# Patient Record
Sex: Female | Born: 1985 | Race: White | Hispanic: No | Marital: Married | Smoking: Never smoker
Health system: Southern US, Community
[De-identification: ages and names within clinical notes are randomized; demographics above are authoritative.]

## PROBLEM LIST (undated history)

## (undated) ENCOUNTER — Inpatient Hospital Stay (HOSPITAL_COMMUNITY)

## (undated) ENCOUNTER — Inpatient Hospital Stay (HOSPITAL_COMMUNITY): Payer: Self-pay

## (undated) ENCOUNTER — Inpatient Hospital Stay: Payer: Self-pay

## (undated) DIAGNOSIS — O368130 Decreased fetal movements, third trimester, not applicable or unspecified: Secondary | ICD-10-CM

## (undated) DIAGNOSIS — F329 Major depressive disorder, single episode, unspecified: Secondary | ICD-10-CM

## (undated) DIAGNOSIS — J302 Other seasonal allergic rhinitis: Secondary | ICD-10-CM

## (undated) DIAGNOSIS — T7840XA Allergy, unspecified, initial encounter: Secondary | ICD-10-CM

## (undated) DIAGNOSIS — K8020 Calculus of gallbladder without cholecystitis without obstruction: Secondary | ICD-10-CM

## (undated) DIAGNOSIS — O99210 Obesity complicating pregnancy, unspecified trimester: Secondary | ICD-10-CM

## (undated) DIAGNOSIS — G4700 Insomnia, unspecified: Secondary | ICD-10-CM

## (undated) DIAGNOSIS — F410 Panic disorder [episodic paroxysmal anxiety] without agoraphobia: Secondary | ICD-10-CM

## (undated) DIAGNOSIS — D649 Anemia, unspecified: Secondary | ICD-10-CM

## (undated) DIAGNOSIS — O0990 Supervision of high risk pregnancy, unspecified, unspecified trimester: Secondary | ICD-10-CM

## (undated) DIAGNOSIS — K219 Gastro-esophageal reflux disease without esophagitis: Secondary | ICD-10-CM

## (undated) DIAGNOSIS — E785 Hyperlipidemia, unspecified: Secondary | ICD-10-CM

## (undated) DIAGNOSIS — L03213 Periorbital cellulitis: Secondary | ICD-10-CM

## (undated) DIAGNOSIS — F32A Depression, unspecified: Secondary | ICD-10-CM

## (undated) DIAGNOSIS — E538 Deficiency of other specified B group vitamins: Secondary | ICD-10-CM

## (undated) DIAGNOSIS — C539 Malignant neoplasm of cervix uteri, unspecified: Secondary | ICD-10-CM

## (undated) HISTORY — DX: Calculus of gallbladder without cholecystitis without obstruction: K80.20

## (undated) HISTORY — DX: Major depressive disorder, single episode, unspecified: F32.9

## (undated) HISTORY — DX: Panic disorder (episodic paroxysmal anxiety): F41.0

## (undated) HISTORY — PX: WISDOM TOOTH EXTRACTION: SHX21

## (undated) HISTORY — DX: Depression, unspecified: F32.A

## (undated) HISTORY — DX: Gastro-esophageal reflux disease without esophagitis: K21.9

## (undated) HISTORY — DX: Allergy, unspecified, initial encounter: T78.40XA

---
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
## 2007-12-07 ENCOUNTER — Encounter: Payer: Self-pay | Admitting: Obstetrics and Gynecology

## 2007-12-07 ENCOUNTER — Ambulatory Visit: Payer: Self-pay | Admitting: Obstetrics and Gynecology

## 2008-12-13 ENCOUNTER — Ambulatory Visit: Payer: Self-pay | Admitting: Obstetrics and Gynecology

## 2009-07-20 ENCOUNTER — Emergency Department: Payer: Self-pay | Admitting: Emergency Medicine

## 2010-01-09 ENCOUNTER — Ambulatory Visit: Payer: Self-pay | Admitting: Obstetrics & Gynecology

## 2010-09-02 NOTE — Assessment & Plan Note (Signed)
NAME:  Loretta Burton, KIL NO.:  [id number]   MEDICAL RECORD NO.:  [id number]          PATIENT TYPE:  POB   LOCATION:  [facility] at [facility]         FACILITY:  [facility]   PHYSICIAN:  Argentina Donovan, MD        DATE OF BIRTH:  1986/01/19   DATE OF SERVICE:  12/07/2007                                  CLINIC NOTE   The patient is a 25 year old Caucasian female, nulligravida who has had  a recent onset of severe cramping the day before and during the first  day of her period.  Up until a year ago, she had been on oral  contraceptives.  She stopped them because of the weight gain, but would  like to start back on some relatively low dose.   PHYSICAL EXAMINATION:  VITAL SIGNS:  The patient is 5 feet 4 inches and  weighs 159 pounds.  Her blood pressure is 124/63, pulse 57 per minute.  GENERAL:  A well-developed and well-nourished white female, in no acute  distress.  HEENT:  Within normal limits.  LUNGS:  Clear to auscultation and percussion.  HEART:  No murmur.  Normal sinus rhythm.  ABDOMEN:  Soft, flat, and nontender.  No masses or organomegaly.  BACK:  Erect.  No CVA tenderness.  EXTREMITIES:  No edema.  No varices.  GENITALIA:  External genitalia normal.  BUS within normal limits.  Vagina is clean and well rugated.  Introitus is marital.  Cervix was  nulliparous and the uterus is anterior, normal size, shape, and  consistency with normal adnexa and free cul-de-sac.   ALLERGIES:  The patient has no allergies.   FAMILY HISTORY:  No significant family history.   PAST SURGICAL HISTORY:  She has had no surgery.   MEDICATIONS:  She is on no medications.  Pap smear was taken.  We are  starting the patient on Loestrin 24 Fe.  I gave her 56-month samples and  prescription for a year.   IMPRESSION:  Normal gynecological examination.   PLAN:  Instructions given in detail how to take the pill.           ______________________________  Argentina Donovan, MD     PR/MEDQ  D:   12/07/2007  T:  12/07/2007  Job:  [id number]

## 2010-09-02 NOTE — Assessment & Plan Note (Signed)
NAMEKHANH, CORDNER            ACCOUNT NO.:  [id number]   MEDICAL RECORD NO.:  [id number]          PATIENT TYPE:  POB   LOCATION:  [facility] at [facility]         FACILITY:  [facility]   PHYSICIAN:  Elsie Lincoln, MD      DATE OF BIRTH:  Oct 02, 1985   DATE OF SERVICE:  01/09/2010                                  CLINIC NOTE   The patient is a 25 year old well-woman female who presents for her  yearly exam.  The patient is on Loestrin FE 24 and very happy.  She has  relatively light periods.  No dysmenorrhea.  She is happily married with  no plans for pregnancy currently.   PAST MEDICAL HISTORY:  Denies all medical problems.   PAST SURGICAL HISTORY:  Denies all surgeries.   GYNECOLOGIC HISTORY:  She is nulliparous.  No problem with periods.  Never had abnormal Pap smear.  No STDs, ovarian cyst, or fibroid tumors.   FAMILY HISTORY:  Positive for diabetes and heart disease in grandparents  and there is a skin cancer like, most of them with basal and squamous.   SOCIAL HISTORY:  She lives with her husband.  She does not smoke.  She  drinks rare alcohol and does drink caffeinated beverages.  She has never  been a victim of sexual or physical abuse.   REVIEW OF SYSTEMS:  Systems review was negative.   PHYSICAL EXAMINATION:  VITAL SIGNS:  Pulse 74, blood pressure 111/71,  weight 193, height 5 feet 4 inches.  GENERAL:  Well-nourished, well-developed, in no apparent distress.  HEENT:  Normocephalic, atraumatic.  Good dentition.  Thyroid no masses.  LUNGS:  Clear to auscultation bilaterally.  HEART:  Regular rate and rhythm.  BREASTS:  No masses, no nipple discharge, no skin changes, no  lymphadenopathy.  ABDOMEN:  Soft, nontender.  No organomegaly, no hernia.  GENITALIA:  Tanner V.  Vagina pink.  Normal rugae.  Cervix closed,  nontender.  Uterus anteverted, nontender.  Adnexa; no masses, nontender.  EXTREMITIES:  No edema.   ASSESSMENT AND PLAN:  A 25 year old female with well-woman  exam.  1. Pap smear.  2. Gonorrhea and Chlamydia.  3. Loestrin FE 24 prescription given for 32 months.  4. Return to clinic in a year.           ______________________________  Elsie Lincoln, MD     KL/MEDQ  D:  01/09/2010  T:  01/10/2010  Job:  [id number]

## 2010-09-02 NOTE — Assessment & Plan Note (Signed)
NAME:  Loretta Burton, ROSENBURG NO.:  [id number]   MEDICAL RECORD NO.:  [id number]          PATIENT TYPE:  POB   LOCATION:  [facility]         FACILITY:  [facility]   PHYSICIAN:  Catalina Antigua, MD     DATE OF BIRTH:  [birth year]   DATE OF SERVICE:  12/13/2008                                  CLINIC NOTE   LOCATION:  [place].   CLINIC NOTE:  This is a 25 year old Caucasian female para 0 who  presented for annual exam.  The patient is currently without complaint.  Her birth control method is Loestrin 62.  She is very content with this  birth control method and she reports that her episodes of cramping and  dysmenorrhea have resolved since her initiation of OCP.  The patient  denies any abnormal discharge or abnormal bleeding.   PHYSICAL EXAMINATION:  VITAL SIGNS:  Blood pressure of 127/71, pulse of  83, her weight is 169 pounds, and her height is 5 feet 4 inches.  GENERAL APPEARANCE:  She is well developed and well nourished, in no  acute distress.  HEART:  Normal sinus rhythm.  BREASTS:  Normal.  No palpable masses, equal in size.  No dimpling.  ABDOMEN:  Soft, nontender, nondistended.  PELVIC:  Her genitalia appeared normal.  Normal vaginal mucosa.  Normal-  appearing cervix.  Uterus is anteverted with no palpable adnexal mass.   PAST MEDICAL HISTORY:  Noncontributory.   OB/GYN HISTORY:  Her LMP was November 25, 2008.   PAST SURGICAL HISTORY:  No past surgical history.   MEDICATION:  The only medication she is taking right now the Loestrin.   IMPRESSION:  Normal gynecologic examination.   PLAN:  The patient had a pap smear and cultures performed and a  prescription for Loestrin 24 Fe as well as a sample was given to the  patient.  The patient has 41-month supply.  The patient is instructed to  return in 1 year or as needed.           ______________________________  Catalina Antigua, MD     PC/MEDQ  D:  12/13/2008  T:  12/13/2008  Job:  [id number]

## 2010-10-04 ENCOUNTER — Emergency Department: Payer: Self-pay | Admitting: Emergency Medicine

## 2010-11-28 ENCOUNTER — Other Ambulatory Visit: Payer: Self-pay | Admitting: Obstetrics & Gynecology

## 2011-07-19 ENCOUNTER — Other Ambulatory Visit: Payer: Self-pay | Admitting: Obstetrics & Gynecology

## 2011-07-29 NOTE — Telephone Encounter (Signed)
Pt needs to come in for annual exam.  Refills given.

## 2011-10-05 ENCOUNTER — Ambulatory Visit (INDEPENDENT_AMBULATORY_CARE_PROVIDER_SITE_OTHER): Payer: 59 | Admitting: Obstetrics & Gynecology

## 2011-10-05 ENCOUNTER — Encounter: Payer: Self-pay | Admitting: Obstetrics & Gynecology

## 2011-10-05 VITALS — BP 111/75 | HR 81 | Ht 64.0 in | Wt 195.0 lb

## 2011-10-05 DIAGNOSIS — Z124 Encounter for screening for malignant neoplasm of cervix: Secondary | ICD-10-CM

## 2011-10-05 DIAGNOSIS — Z3041 Encounter for surveillance of contraceptive pills: Secondary | ICD-10-CM

## 2011-10-05 DIAGNOSIS — Z113 Encounter for screening for infections with a predominantly sexual mode of transmission: Secondary | ICD-10-CM

## 2011-10-05 DIAGNOSIS — Z01419 Encounter for gynecological examination (general) (routine) without abnormal findings: Secondary | ICD-10-CM

## 2011-10-05 MED ORDER — NORETHIN ACE-ETH ESTRAD-FE 1-20 MG-MCG(24) PO TABS: 1.0000 | ORAL_TABLET | Freq: Every day | ORAL | Status: DC

## 2011-10-05 NOTE — Progress Notes (Signed)
  Subjective:     Loretta Burton is a 26 y.o. G0 female and is here for a comprehensive physical GYN exam. The patient reports no problems. Denies any irregular bleeding, abnormal vaginal discharge, problems with intercourse or other concerns.  PAST MEDICAL HISTORY: Panic attacks   PAST SURGICAL HISTORY: Denies all surgeries.   GYNECOLOGIC HISTORY: She is nulliparous. No problem with periods. Never had abnormal Pap smear. No STDs, ovarian cyst, or fibroid tumors.   MEDICATIONS: Xanax prn, Loestrin 24 Fe  ALLERGIES: Ciprofloxacin - Hives, GI intolerance  FAMILY HISTORY: Positive for diabetes and heart disease in grandparents and there is a skin cancer like, most of them with basal and squamous.   SOCIAL HISTORY: She lives with her husband. She does not smoke. She drinks rare alcohol and does drink caffeinated beverages. She has never been a victim of sexual or physical abuse.   The following portions of the patient's history were reviewed and updated as appropriate: allergies, current medications, past family history, past medical history, past social history, past surgical history and problem list.  Review of Systems A comprehensive review of systems was negative.   Objective:  Blood pressure 111/75, pulse 81, height 5\' 4"  (1.626 m), weight 195 lb (88.451 kg), last menstrual period 10/01/2011.  GENERAL: Well-nourished, well-developed, in no apparent distress.  HEENT: Normocephalic, atraumatic. Good dentition. Thyroid no masses.  LUNGS: Clear to auscultation bilaterally.  HEART: Regular rate and rhythm.  BREASTS: No masses, no nipple discharge, no skin changes, no lymphadenopathy.  ABDOMEN: Soft, nontender. No organomegaly, no hernia.  GENITALIA: Tanner V. Vagina pink. Normal rugae. Cervix closed, nontender, prominent ectropion noted. Uterus anteverted, nontender. Adnexa; no masses, nontender.  EXTREMITIES: No edema.   Assessment:    Healthy female exam.    Plan:    1. Pap  smear done.  2. Gonorrhea and Chlamydia checked.  3. Loestrin FE 24 prescription refilled 4. Return to clinic in a year or earlier if needed

## 2011-10-05 NOTE — Patient Instructions (Signed)
Preventive Care for Adults, Female A healthy lifestyle and preventive care can promote health and wellness. Preventive health guidelines for women include the following key practices.  A routine yearly physical is a good way to check with your caregiver about your health and preventive screening. It is a chance to share any concerns and updates on your health, and to receive a thorough exam.   Visit your dentist for a routine exam and preventive care every 6 months. Brush your teeth twice a day and floss once a day. Good oral hygiene prevents tooth decay and gum disease.   The frequency of eye exams is based on your age, health, family medical history, use of contact lenses, and other factors. Follow your caregiver's recommendations for frequency of eye exams.   Eat a healthy diet. Foods like vegetables, fruits, whole grains, low-fat dairy products, and lean protein foods contain the nutrients you need without too many calories. Decrease your intake of foods high in solid fats, added sugars, and salt. Eat the right amount of calories for you.Get information about a proper diet from your caregiver, if necessary.   Regular physical exercise is one of the most important things you can do for your health. Most adults should get at least 150 minutes of moderate-intensity exercise (any activity that increases your heart rate and causes you to sweat) each week. In addition, most adults need muscle-strengthening exercises on 2 or more days a week.   Maintain a healthy weight. The body mass index (BMI) is a screening tool to identify possible weight problems. It provides an estimate of body fat based on height and weight. Your caregiver can help determine your BMI, and can help you achieve or maintain a healthy weight.For adults 20 years and older:   A BMI below 18.5 is considered underweight.   A BMI of 18.5 to 24.9 is normal.   A BMI of 25 to 29.9 is considered overweight.   A BMI of 30 and above is  considered obese.   Maintain normal blood lipids and cholesterol levels by exercising and minimizing your intake of saturated fat. Eat a balanced diet with plenty of fruit and vegetables. Blood tests for lipids and cholesterol should begin at age 20 and be repeated every 5 years. If your lipid or cholesterol levels are high, you are over 50, or you are at high risk for heart disease, you may need your cholesterol levels checked more frequently.Ongoing high lipid and cholesterol levels should be treated with medicines if diet and exercise are not effective.   If you smoke, find out from your caregiver how to quit. If you do not use tobacco, do not start.   If you are pregnant, do not drink alcohol. If you are breastfeeding, be very cautious about drinking alcohol. If you are not pregnant and choose to drink alcohol, do not exceed 1 drink per day. One drink is considered to be 12 ounces (355 mL) of beer, 5 ounces (148 mL) of wine, or 1.5 ounces (44 mL) of liquor.   Avoid use of street drugs. Do not share needles with anyone. Ask for help if you need support or instructions about stopping the use of drugs.   High blood pressure causes heart disease and increases the risk of stroke. Your blood pressure should be checked at least every 1 to 2 years. Ongoing high blood pressure should be treated with medicines if weight loss and exercise are not effective.   If you are 55 to 24   years old, ask your caregiver if you should take aspirin to prevent strokes.   Diabetes screening involves taking a blood sample to check your fasting blood sugar level. This should be done once every 3 years, after age 45, if you are within normal weight and without risk factors for diabetes. Testing should be considered at a younger age or be carried out more frequently if you are overweight and have at least 1 risk factor for diabetes.   Breast cancer screening is essential preventive care for women. You should practice "breast  self-awareness." This means understanding the normal appearance and feel of your breasts and may include breast self-examination. Any changes detected, no matter how small, should be reported to a caregiver. Women in their 20s and 30s should have a clinical breast exam (CBE) by a caregiver as part of a regular health exam every 1 to 3 years. After age 40, women should have a CBE every year. Starting at age 40, women should consider having a mammography (breast X-ray test) every year. Women who have a family history of breast cancer should talk to their caregiver about genetic screening. Women at a high risk of breast cancer should talk to their caregivers about having magnetic resonance imaging (MRI) and a mammography every year.   The Pap test is a screening test for cervical cancer. A Pap test can show cell changes on the cervix that might become cervical cancer if left untreated. A Pap test is a procedure in which cells are obtained and examined from the lower end of the uterus (cervix).   Women should have a Pap test starting at age 21.   Between ages 21 and 29, Pap tests should be repeated every 2 years.   Beginning at age 30, you should have a Pap test every 3 years as long as the past 3 Pap tests have been normal.   Some women have medical problems that increase the chance of getting cervical cancer. Talk to your caregiver about these problems. It is especially important to talk to your caregiver if a new problem develops soon after your last Pap test. In these cases, your caregiver may recommend more frequent screening and Pap tests.   The above recommendations are the same for women who have or have not gotten the vaccine for human papillomavirus (HPV).   If you had a hysterectomy for a problem that was not cancer or a condition that could lead to cancer, then you no longer need Pap tests. Even if you no longer need a Pap test, a regular exam is a good idea to make sure no other problems are  starting.   If you are between ages 65 and 70, and you have had normal Pap tests going back 10 years, you no longer need Pap tests. Even if you no longer need a Pap test, a regular exam is a good idea to make sure no other problems are starting.   If you have had past treatment for cervical cancer or a condition that could lead to cancer, you need Pap tests and screening for cancer for at least 20 years after your treatment.   If Pap tests have been discontinued, risk factors (such as a new sexual partner) need to be reassessed to determine if screening should be resumed.   The HPV test is an additional test that may be used for cervical cancer screening. The HPV test looks for the virus that can cause the cell changes on the cervix.   The cells collected during the Pap test can be tested for HPV. The HPV test could be used to screen women aged 30 years and older, and should be used in women of any age who have unclear Pap test results. After the age of 30, women should have HPV testing at the same frequency as a Pap test.   Colorectal cancer can be detected and often prevented. Most routine colorectal cancer screening begins at the age of 50 and continues through age 75. However, your caregiver may recommend screening at an earlier age if you have risk factors for colon cancer. On a yearly basis, your caregiver may provide home test kits to check for hidden blood in the stool. Use of a small camera at the end of a tube, to directly examine the colon (sigmoidoscopy or colonoscopy), can detect the earliest forms of colorectal cancer. Talk to your caregiver about this at age 50, when routine screening begins. Direct examination of the colon should be repeated every 5 to 10 years through age 75, unless early forms of pre-cancerous polyps or small growths are found.   Hepatitis C blood testing is recommended for all people born from 1945 through 1965 and any individual with known risks for hepatitis C.    Practice safe sex. Use condoms and avoid high-risk sexual practices to reduce the spread of sexually transmitted infections (STIs). STIs include gonorrhea, chlamydia, syphilis, trichomonas, herpes, HPV, and human immunodeficiency virus (HIV). Herpes, HIV, and HPV are viral illnesses that have no cure. They can result in disability, cancer, and death. Sexually active women aged 25 and younger should be checked for chlamydia. Older women with new or multiple partners should also be tested for chlamydia. Testing for other STIs is recommended if you are sexually active and at increased risk.   Osteoporosis is a disease in which the bones lose minerals and strength with aging. This can result in serious bone fractures. The risk of osteoporosis can be identified using a bone density scan. Women ages 65 and over and women at risk for fractures or osteoporosis should discuss screening with their caregivers. Ask your caregiver whether you should take a calcium supplement or vitamin D to reduce the rate of osteoporosis.   Menopause can be associated with physical symptoms and risks. Hormone replacement therapy is available to decrease symptoms and risks. You should talk to your caregiver about whether hormone replacement therapy is right for you.   Use sunscreen with sun protection factor (SPF) of 30 or more. Apply sunscreen liberally and repeatedly throughout the day. You should seek shade when your shadow is shorter than you. Protect yourself by wearing long sleeves, pants, a wide-brimmed hat, and sunglasses year round, whenever you are outdoors.   Once a month, do a whole body skin exam, using a mirror to look at the skin on your back. Notify your caregiver of new moles, moles that have irregular borders, moles that are larger than a pencil eraser, or moles that have changed in shape or color.   Stay current with required immunizations.   Influenza. You need a dose every fall (or winter). The composition of  the flu vaccine changes each year, so being vaccinated once is not enough.   Pneumococcal polysaccharide. You need 1 to 2 doses if you smoke cigarettes or if you have certain chronic medical conditions. You need 1 dose at age 65 (or older) if you have never been vaccinated.   Tetanus, diphtheria, pertussis (Tdap, Td). Get 1 dose of   Tdap vaccine if you are younger than age 65, are over 65 and have contact with an infant, are a healthcare worker, are pregnant, or simply want to be protected from whooping cough. After that, you need a Td booster dose every 10 years. Consult your caregiver if you have not had at least 3 tetanus and diphtheria-containing shots sometime in your life or have a deep or dirty wound.   HPV. You need this vaccine if you are a woman age 26 or younger. The vaccine is given in 3 doses over 6 months.   Measles, mumps, rubella (MMR). You need at least 1 dose of MMR if you were born in 1957 or later. You may also need a second dose.   Meningococcal. If you are age 19 to 21 and a first-year college student living in a residence hall, or have one of several medical conditions, you need to get vaccinated against meningococcal disease. You may also need additional booster doses.   Zoster (shingles). If you are age 60 or older, you should get this vaccine.   Varicella (chickenpox). If you have never had chickenpox or you were vaccinated but received only 1 dose, talk to your caregiver to find out if you need this vaccine.   Hepatitis A. You need this vaccine if you have a specific risk factor for hepatitis A virus infection or you simply wish to be protected from this disease. The vaccine is usually given as 2 doses, 6 to 18 months apart.   Hepatitis B. You need this vaccine if you have a specific risk factor for hepatitis B virus infection or you simply wish to be protected from this disease. The vaccine is given in 3 doses, usually over 6 months.  Preventive Services /  Frequency Ages 19 to 39  Blood pressure check.** / Every 1 to 2 years.   Lipid and cholesterol check.** / Every 5 years beginning at age 20.   Clinical breast exam.** / Every 3 years for women in their 20s and 30s.   Pap test.** / Every 2 years from ages 21 through 29. Every 3 years starting at age 30 through age 65 or 70 with a history of 3 consecutive normal Pap tests.   HPV screening.** / Every 3 years from ages 30 through ages 65 to 70 with a history of 3 consecutive normal Pap tests.   Hepatitis C blood test.** / For any individual with known risks for hepatitis C.   Skin self-exam. / Monthly.   Influenza immunization.** / Every year.   Pneumococcal polysaccharide immunization.** / 1 to 2 doses if you smoke cigarettes or if you have certain chronic medical conditions.   Tetanus, diphtheria, pertussis (Tdap, Td) immunization. / A one-time dose of Tdap vaccine. After that, you need a Td booster dose every 10 years.   HPV immunization. / 3 doses over 6 months, if you are 26 and younger.   Measles, mumps, rubella (MMR) immunization. / You need at least 1 dose of MMR if you were born in 1957 or later. You may also need a second dose.   Meningococcal immunization. / 1 dose if you are age 19 to 21 and a first-year college student living in a residence hall, or have one of several medical conditions, you need to get vaccinated against meningococcal disease. You may also need additional booster doses.   Varicella immunization.** / Consult your caregiver.   Hepatitis A immunization.** / Consult your caregiver. 2 doses, 6 to 18 months   apart.   Hepatitis B immunization.** / Consult your caregiver. 3 doses usually over 6 months.    ** Family history and personal history of risk and conditions may change your caregiver's recommendations. Document Released: 06/02/2001 Document Revised: 03/26/2011 Document Reviewed: 09/01/2010 [employer] Patient Information 2012 [employer]. 

## 2012-01-04 ENCOUNTER — Telehealth: Payer: Self-pay | Admitting: Gynecology

## 2012-01-04 DIAGNOSIS — Z309 Encounter for contraceptive management, unspecified: Secondary | ICD-10-CM

## 2012-01-04 MED ORDER — ORTHO TRI-CYCLEN LO 0.18/0.215/0.25 MG-25 MCG PO TABS: 1.0000 | ORAL_TABLET | Freq: Every day | ORAL | Status: DC

## 2012-01-04 NOTE — Telephone Encounter (Signed)
Pat.ient call [facility] discontinue her loestrin and would like a another script send to [facility]. Will send ortho lo to [facility].

## 2013-01-14 ENCOUNTER — Other Ambulatory Visit: Payer: Self-pay | Admitting: Obstetrics & Gynecology

## 2013-10-13 ENCOUNTER — Ambulatory Visit: Payer: 59 | Admitting: Obstetrics & Gynecology

## 2014-01-17 DIAGNOSIS — F419 Anxiety disorder, unspecified: Secondary | ICD-10-CM | POA: Insufficient documentation

## 2014-01-17 DIAGNOSIS — F329 Major depressive disorder, single episode, unspecified: Secondary | ICD-10-CM | POA: Insufficient documentation

## 2014-02-28 ENCOUNTER — Ambulatory Visit (INDEPENDENT_AMBULATORY_CARE_PROVIDER_SITE_OTHER): Payer: No Typology Code available for payment source | Admitting: Obstetrics & Gynecology

## 2014-02-28 ENCOUNTER — Encounter: Payer: Self-pay | Admitting: Obstetrics & Gynecology

## 2014-02-28 VITALS — BP 126/77 | HR 94 | Wt 221.0 lb

## 2014-02-28 DIAGNOSIS — Z113 Encounter for screening for infections with a predominantly sexual mode of transmission: Secondary | ICD-10-CM

## 2014-02-28 DIAGNOSIS — Z3490 Encounter for supervision of normal pregnancy, unspecified, unspecified trimester: Secondary | ICD-10-CM | POA: Insufficient documentation

## 2014-02-28 DIAGNOSIS — Z3491 Encounter for supervision of normal pregnancy, unspecified, first trimester: Secondary | ICD-10-CM

## 2014-02-28 DIAGNOSIS — Z124 Encounter for screening for malignant neoplasm of cervix: Secondary | ICD-10-CM

## 2014-02-28 NOTE — Patient Instructions (Signed)
First Trimester of Pregnancy The first trimester of pregnancy is from week 1 until the end of week 12 (months 1 through 3). A week after a sperm fertilizes an egg, the egg will implant on the wall of the uterus. This embryo will begin to develop into a baby. Genes from you and your partner are forming the baby. The female genes determine whether the baby is a boy or a girl. At 6-8 weeks, the eyes and face are formed, and the heartbeat can be seen on ultrasound. At the end of 12 weeks, all the baby's organs are formed.  Now that you are pregnant, you will want to do everything you can to have a healthy baby. Two of the most important things are to get good prenatal care and to follow your health care provider's instructions. Prenatal care is all the medical care you receive before the baby's birth. This care will help prevent, find, and treat any problems during the pregnancy and childbirth. BODY CHANGES Your body goes through many changes during pregnancy. The changes vary from woman to woman.   You may gain or lose a couple of pounds at first.  You may feel sick to your stomach (nauseous) and throw up (vomit). If the vomiting is uncontrollable, call your health care provider.  You may tire easily.  You may develop headaches that can be relieved by medicines approved by your health care provider.  You may urinate more often. Painful urination may mean you have a bladder infection.  You may develop heartburn as a result of your pregnancy.  You may develop constipation because certain hormones are causing the muscles that push waste through your intestines to slow down.  You may develop hemorrhoids or swollen, bulging veins (varicose veins).  Your breasts may begin to grow larger and become tender. Your nipples may stick out more, and the tissue that surrounds them (areola) may become darker.  Your gums may bleed and may be sensitive to brushing and flossing.  Dark spots or blotches (chloasma,  mask of pregnancy) may develop on your face. This will likely fade after the baby is born.  Your menstrual periods will stop.  You may have a loss of appetite.  You may develop cravings for certain kinds of food.  You may have changes in your emotions from day to day, such as being excited to be pregnant or being concerned that something may go wrong with the pregnancy and baby.  You may have more vivid and strange dreams.  You may have changes in your hair. These can include thickening of your hair, rapid growth, and changes in texture. Some women also have hair loss during or after pregnancy, or hair that feels dry or thin. Your hair will most likely return to normal after your baby is born. WHAT TO EXPECT AT YOUR PRENATAL VISITS During a routine prenatal visit:  You will be weighed to make sure you and the baby are growing normally.  Your blood pressure will be taken.  Your abdomen will be measured to track your baby's growth.  The fetal heartbeat will be listened to starting around week 10 or 12 of your pregnancy.  Test results from any previous visits will be discussed. Your health care provider may ask you:  How you are feeling.  If you are feeling the baby move.  If you have had any abnormal symptoms, such as leaking fluid, bleeding, severe headaches, or abdominal cramping.  If you have any questions. Other tests   that may be performed during your first trimester include:  Blood tests to find your blood type and to check for the presence of any previous infections. They will also be used to check for low iron levels (anemia) and Rh antibodies. Later in the pregnancy, blood tests for diabetes will be done along with other tests if problems develop.  Urine tests to check for infections, diabetes, or protein in the urine.  An ultrasound to confirm the proper growth and development of the baby.  An amniocentesis to check for possible genetic problems.  Fetal screens for  spina bifida and Down syndrome.  You may need other tests to make sure you and the baby are doing well. HOME CARE INSTRUCTIONS  Medicines  Follow your health care provider's instructions regarding medicine use. Specific medicines may be either safe or unsafe to take during pregnancy.  Take your prenatal vitamins as directed.  If you develop constipation, try taking a stool softener if your health care provider approves. Diet  Eat regular, well-balanced meals. Choose a variety of foods, such as meat or vegetable-based protein, fish, milk and low-fat dairy products, vegetables, fruits, and whole grain breads and cereals. Your health care provider will help you determine the amount of weight gain that is right for you.  Avoid raw meat and uncooked cheese. These carry germs that can cause birth defects in the baby.  Eating four or five small meals rather than three large meals a day may help relieve nausea and vomiting. If you start to feel nauseous, eating a few soda crackers can be helpful. Drinking liquids between meals instead of during meals also seems to help nausea and vomiting.  If you develop constipation, eat more high-fiber foods, such as fresh vegetables or fruit and whole grains. Drink enough fluids to keep your urine clear or pale yellow. Activity and Exercise  Exercise only as directed by your health care provider. Exercising will help you:  Control your weight.  Stay in shape.  Be prepared for labor and delivery.  Experiencing pain or cramping in the lower abdomen or low back is a good sign that you should stop exercising. Check with your health care provider before continuing normal exercises.  Try to avoid standing for long periods of time. Move your legs often if you must stand in one place for a long time.  Avoid heavy lifting.  Wear low-heeled shoes, and practice good posture.  You may continue to have sex unless your health care provider directs you  otherwise. Relief of Pain or Discomfort  Wear a good support bra for breast tenderness.   Take warm sitz baths to soothe any pain or discomfort caused by hemorrhoids. Use hemorrhoid cream if your health care provider approves.   Rest with your legs elevated if you have leg cramps or low back pain.  If you develop varicose veins in your legs, wear support hose. Elevate your feet for 15 minutes, 3-4 times a day. Limit salt in your diet. Prenatal Care  Schedule your prenatal visits by the twelfth week of pregnancy. They are usually scheduled monthly at first, then more often in the last 2 months before delivery.  Write down your questions. Take them to your prenatal visits.  Keep all your prenatal visits as directed by your health care provider. Safety  Wear your seat belt at all times when driving.  Make a list of emergency phone numbers, including numbers for family, friends, the hospital, and police and fire departments. General Tips    Ask your health care provider for a referral to a local prenatal education class. Begin classes no later than at the beginning of month 6 of your pregnancy.  Ask for help if you have counseling or nutritional needs during pregnancy. Your health care provider can offer advice or refer you to specialists for help with various needs.  Do not use hot tubs, steam rooms, or saunas.  Do not douche or use tampons or scented sanitary pads.  Do not cross your legs for long periods of time.  Avoid cat litter boxes and soil used by cats. These carry germs that can cause birth defects in the baby and possibly loss of the fetus by miscarriage or stillbirth.  Avoid all smoking, herbs, alcohol, and medicines not prescribed by your health care provider. Chemicals in these affect the formation and growth of the baby.  Schedule a dentist appointment. At home, brush your teeth with a soft toothbrush and be gentle when you floss. SEEK MEDICAL CARE IF:   You have  dizziness.  You have mild pelvic cramps, pelvic pressure, or nagging pain in the abdominal area.  You have persistent nausea, vomiting, or diarrhea.  You have a bad smelling vaginal discharge.  You have pain with urination.  You notice increased swelling in your face, hands, legs, or ankles. SEEK IMMEDIATE MEDICAL CARE IF:   You have a fever.  You are leaking fluid from your vagina.  You have spotting or bleeding from your vagina.  You have severe abdominal cramping or pain.  You have rapid weight gain or loss.  You vomit blood or material that looks like coffee grounds.  You are exposed to German measles and have never had them.  You are exposed to fifth disease or chickenpox.  You develop a severe headache.  You have shortness of breath.  You have any kind of trauma, such as from a fall or a car accident. Document Released: 03/31/2001 Document Revised: 08/21/2013 Document Reviewed: 02/14/2013 Exit[employer] Patient Information 2015 [employer]. This information is not intended to replace advice given to you by your health care provider. Make sure you discuss any questions you have with your health care provider.  

## 2014-02-28 NOTE — Progress Notes (Signed)
   Subjective:   Loretta Burton is a 28 y.o.G1P0 at 2747w4d by LMP being seen today for her first obstetrical visit.  Patient reports no significant complaints.  Filed Vitals:   02/28/14 0948  BP: 126/77  Pulse: 94  Weight: 221 lb (100.245 kg)    HISTORY: OB History  Gravida Para Term Preterm AB SAB TAB Ectopic Multiple Living  1 0 0 0 0 0 0 0 0 0     # Outcome Date GA Lbr Len/2nd Weight Sex Delivery Anes PTL Lv  1 Current              Past Medical History  Diagnosis Date  . Depression   . Panic attack   . Allergy     seasonal   Past Surgical History  Procedure Laterality Date  . Wisdom tooth extraction      x4   Family History  Problem Relation Age of Onset  . Diabetes Mother   . Hypertension Mother   . Depression Mother   . Diabetes Maternal Grandmother   . Hypertension Maternal Grandmother   . Heart disease Maternal Grandmother   . Cancer Maternal Grandmother     skin  . Diabetes Paternal Grandmother   . Hypertension Paternal Grandmother   . Heart disease Paternal Grandmother   . Diabetes Paternal Grandfather   . Hypertension Paternal Grandfather   . Heart disease Paternal Grandfather      Exam    Uterus:     Pelvic Exam:    Perineum: No Hemorrhoids, Normal Perineum   Vulva: normal   Vagina:  normal mucosa, normal discharge   Cervix: no bleeding following Pap, no cervical motion tenderness, no lesions and nulliparous appearance   Adnexa: normal adnexa and no mass, fullness, tenderness   Bony Pelvis: average  System: Breast:  normal appearance, no masses or tenderness   Skin: normal coloration and turgor, no rashes   Neurologic: oriented, normal   Extremities: normal strength, tone, and muscle mass   HEENT PERRLA and extra ocular movement intact   Mouth/Teeth mucous membranes moist, pharynx normal without lesions and dental hygiene good   Neck supple and no masses   Cardiovascular: regular rate and rhythm   Respiratory:  appears well, vitals  normal, no respiratory distress, acyanotic, normal RR, chest clear, no wheezing, crepitations, rhonchi, normal symmetric air entry   Abdomen: soft, non-tender; bowel sounds normal; no masses,  no organomegaly   Urinary: urethral meatus normal   Clinic u/s : Viable 13101w1d  [facility]   Assessment:    Pregnancy: G1P0000 Patient Active Problem List   Diagnosis Date Noted  . Supervision of normal pregnancy 02/28/2014     Plan:   Initial labs drawn. Continue prenatal vitamins. Problem list reviewed and updated. Genetic Screening discussed First Screen, NIPS and Quad Screen: all declined. Ultrasound discussed; fetal survey: to be ordered later. [facility] with multiple MDs and other Advanced Practitioners was explained to patient; also emphasized that residents, students are part of our team. Follow up in 4 weeks.  Routine obstetric precautions reviewed.   Tereso NewcomerANYANWU,UGONNA A, MD 02/28/2014

## 2014-03-01 LAB — PRENATAL PROFILE (SOLSTAS)
Antibody Screen: NEGATIVE
BASOS ABS: 0 10*3/uL (ref 0.0–0.1)
BASOS PCT: 0 % (ref 0–1)
EOS ABS: 0.1 10*3/uL (ref 0.0–0.7)
Eosinophils Relative: 1 % (ref 0–5)
HCT: 36.8 % (ref 36.0–46.0)
HEMOGLOBIN: 12.3 g/dL (ref 12.0–15.0)
HEP B S AG: NEGATIVE
HIV: NONREACTIVE
Lymphocytes Relative: 22 % (ref 12–46)
Lymphs Abs: 2.4 10*3/uL (ref 0.7–4.0)
MCH: 30.1 pg (ref 26.0–34.0)
MCHC: 33.4 g/dL (ref 30.0–36.0)
MCV: 90.2 fL (ref 78.0–100.0)
MONOS PCT: 7 % (ref 3–12)
Monocytes Absolute: 0.8 10*3/uL (ref 0.1–1.0)
NEUTROS ABS: 7.6 10*3/uL (ref 1.7–7.7)
NEUTROS PCT: 70 % (ref 43–77)
PLATELETS: 268 10*3/uL (ref 150–400)
RBC: 4.08 MIL/uL (ref 3.87–5.11)
RDW: 13.5 % (ref 11.5–15.5)
Rh Type: POSITIVE
Rubella: 2.07 Index — ABNORMAL HIGH (ref <0.90)
WBC: 10.8 10*3/uL — ABNORMAL HIGH (ref 4.0–10.5)

## 2014-03-03 LAB — CULTURE, OB URINE

## 2014-03-05 ENCOUNTER — Telehealth: Payer: Self-pay | Admitting: Obstetrics & Gynecology

## 2014-03-05 DIAGNOSIS — O219 Vomiting of pregnancy, unspecified: Secondary | ICD-10-CM

## 2014-03-05 LAB — CYTOLOGY - PAP

## 2014-03-05 MED ORDER — PROMETHAZINE HCL 25 MG PO TABS: 25.0000 mg | ORAL_TABLET | Freq: Four times a day (QID) | ORAL | Status: DC | PRN

## 2014-03-05 NOTE — Telephone Encounter (Signed)
Patient was offered an Rx for nausea and vomiting as last prenatal visit, but declined.  Has decided now she does want Rx since symptoms are persisting.

## 2014-03-28 ENCOUNTER — Ambulatory Visit (INDEPENDENT_AMBULATORY_CARE_PROVIDER_SITE_OTHER): Payer: No Typology Code available for payment source | Admitting: Obstetrics & Gynecology

## 2014-03-28 ENCOUNTER — Encounter: Payer: Self-pay | Admitting: Obstetrics & Gynecology

## 2014-03-28 VITALS — BP 107/63 | HR 90 | Wt 212.0 lb

## 2014-03-28 DIAGNOSIS — O219 Vomiting of pregnancy, unspecified: Secondary | ICD-10-CM

## 2014-03-28 DIAGNOSIS — Z3491 Encounter for supervision of normal pregnancy, unspecified, first trimester: Secondary | ICD-10-CM

## 2014-03-28 MED ORDER — ONDANSETRON 4 MG PO TBDP: 4.0000 mg | ORAL_TABLET | Freq: Four times a day (QID) | ORAL | Status: DC | PRN

## 2014-03-28 NOTE — Progress Notes (Signed)
Pt c/o nausea not controlled with Phenergan and insomnia. Zofran prn and melatonin F/u in 4 weeks or sooner prn

## 2014-03-28 NOTE — Patient Instructions (Signed)
Morning Sickness °Morning sickness is when you feel sick to your stomach (nauseous) during pregnancy. You may feel sick to your stomach and throw up (vomit). You may feel sick in the morning, but you can feel this way any time of day. Some women feel very sick to their stomach and cannot stop throwing up (hyperemesis gravidarum). °HOME CARE °· Only take medicines as told by your doctor. °· Take multivitamins as told by your doctor. Taking multivitamins before getting pregnant can stop or lessen the harshness of morning sickness. °· Eat dry toast or unsalted crackers before getting out of bed. °· Eat 5 to 6 small meals a day. °· Eat dry and bland foods like rice and baked potatoes. °· Do not drink liquids with meals. Drink between meals. °· Do not eat greasy, fatty, or spicy foods. °· Have someone cook for you if the smell of food causes you to feel sick or throw up. °· If you feel sick to your stomach after taking prenatal vitamins, take them at night or with a snack. °· Eat protein when you need a snack (nuts, yogurt, cheese). °· Eat unsweetened gelatins for dessert. °· Wear a bracelet used for sea sickness (acupressure wristband). °· Go to a doctor that puts thin needles into certain body points (acupuncture) to improve how you feel. °· Do not smoke. °· Use a humidifier to keep the air in your house free of odors. °· Get lots of fresh air. °GET HELP IF: °· You need medicine to feel better. °· You feel dizzy or lightheaded. °· You are losing weight. °GET HELP RIGHT AWAY IF:  °· You feel very sick to your stomach and cannot stop throwing up. °· You pass out (faint). °MAKE SURE YOU: °· Understand these instructions. °· Will watch your condition. °· Will get help right away if you are not doing well or get worse. °Document Released: 05/14/2004 Document Revised: 04/11/2013 Document Reviewed: 09/21/2012 °ExitCare® Patient Information ©2015 [employer]. This information is not intended to replace advice given to you by  your health care provider. Make sure you discuss any questions you have with your health care provider. ° °

## 2014-04-20 NOTE — L&D Delivery Note (Cosign Needed)
Delivery Note At 4:13 PM a viable female was delivered via Vaginal, Spontaneous Delivery (Presentation: ;left Occiput Anterior).  APGAR: 6, 8; weight  .   Placenta status: Intact, Spontaneous.  Cord: 3 vessels with the following complications: None.  Cord pH: n/a  Anesthesia: Epidural  Episiotomy: None Lacerations: 1st degree Suture Repair: 2.0 vicryl Est. Blood Loss (mL): 50  Mom to postpartum.  Baby to Couplet care / Skin to Skin.  Wyvonnia Dusky DARLENE 10/14/2014, 5:19 PM

## 2014-04-26 ENCOUNTER — Encounter: Payer: Self-pay | Admitting: Obstetrics & Gynecology

## 2014-04-26 ENCOUNTER — Ambulatory Visit (INDEPENDENT_AMBULATORY_CARE_PROVIDER_SITE_OTHER): Payer: No Typology Code available for payment source | Admitting: Obstetrics & Gynecology

## 2014-04-26 VITALS — BP 111/71 | HR 100 | Wt 208.0 lb

## 2014-04-26 DIAGNOSIS — Z36 Encounter for antenatal screening of mother: Secondary | ICD-10-CM

## 2014-04-26 DIAGNOSIS — Z3492 Encounter for supervision of normal pregnancy, unspecified, second trimester: Secondary | ICD-10-CM

## 2014-04-26 NOTE — Progress Notes (Signed)
Routine visit. Denies any problems. Early glucola today. Declines genetic screening. MSAFP at next visit. Schedule anatomy scan.

## 2014-04-27 LAB — GLUCOSE TOLERANCE, 1 HOUR (50G) W/O FASTING: Glucose, 1 Hour GTT: 130 mg/dL (ref 70–140)

## 2014-05-07 ENCOUNTER — Telehealth: Payer: Self-pay | Admitting: Obstetrics & Gynecology

## 2014-05-07 DIAGNOSIS — Z3491 Encounter for supervision of normal pregnancy, unspecified, first trimester: Secondary | ICD-10-CM

## 2014-05-07 DIAGNOSIS — O219 Vomiting of pregnancy, unspecified: Secondary | ICD-10-CM

## 2014-05-07 MED ORDER — ONDANSETRON 4 MG PO TBDP: 4.0000 mg | ORAL_TABLET | Freq: Four times a day (QID) | ORAL | Status: DC | PRN

## 2014-05-07 NOTE — Telephone Encounter (Signed)
Patient requested refills of zofran.

## 2014-05-18 ENCOUNTER — Other Ambulatory Visit: Payer: Self-pay | Admitting: Obstetrics & Gynecology

## 2014-05-18 ENCOUNTER — Ambulatory Visit (HOSPITAL_COMMUNITY)
Admission: RE | Admit: 2014-05-18 | Discharge: 2014-05-18 | Disposition: A | Discharge status: Home / Self Care | Payer: No Typology Code available for payment source | Source: Ambulatory Visit | Attending: Obstetrics & Gynecology | Admitting: Obstetrics & Gynecology

## 2014-05-18 DIAGNOSIS — Z3492 Encounter for supervision of normal pregnancy, unspecified, second trimester: Secondary | ICD-10-CM | POA: Insufficient documentation

## 2014-05-18 DIAGNOSIS — Z3689 Encounter for other specified antenatal screening: Secondary | ICD-10-CM | POA: Insufficient documentation

## 2014-05-18 DIAGNOSIS — Z3A18 18 weeks gestation of pregnancy: Secondary | ICD-10-CM | POA: Insufficient documentation

## 2014-05-25 ENCOUNTER — Ambulatory Visit (INDEPENDENT_AMBULATORY_CARE_PROVIDER_SITE_OTHER): Payer: No Typology Code available for payment source | Admitting: Family Medicine

## 2014-05-25 VITALS — BP 116/64 | HR 112 | Wt 209.0 lb

## 2014-05-25 DIAGNOSIS — Z3491 Encounter for supervision of normal pregnancy, unspecified, first trimester: Secondary | ICD-10-CM

## 2014-05-25 DIAGNOSIS — Z3492 Encounter for supervision of normal pregnancy, unspecified, second trimester: Secondary | ICD-10-CM

## 2014-05-25 DIAGNOSIS — O26892 Other specified pregnancy related conditions, second trimester: Secondary | ICD-10-CM

## 2014-05-25 DIAGNOSIS — O219 Vomiting of pregnancy, unspecified: Secondary | ICD-10-CM

## 2014-05-25 DIAGNOSIS — R300 Dysuria: Secondary | ICD-10-CM

## 2014-05-25 LAB — POCT URINALYSIS DIPSTICK
Bilirubin, UA: NEGATIVE
Glucose, UA: NEGATIVE
Nitrite, UA: NEGATIVE
Protein, UA: NEGATIVE
RBC UA: NEGATIVE
Spec Grav, UA: 1.01
Urobilinogen, UA: NEGATIVE
pH, UA: 5

## 2014-05-25 MED ORDER — ONDANSETRON 4 MG PO TBDP: 4.0000 mg | ORAL_TABLET | Freq: Four times a day (QID) | ORAL | Status: DC | PRN

## 2014-05-25 NOTE — Progress Notes (Signed)
Needs refill on her Zofran.  Still having nausea, wanting to make sure this is normal versus gall bladder issue. Large ketones, moderate leuks on urine dip.

## 2014-05-25 NOTE — Progress Notes (Signed)
C/o nausea Needs refill on Zofran Finds that some foods are making her feel queasy.

## 2014-05-25 NOTE — Patient Instructions (Signed)
Second Trimester of Pregnancy The second trimester is from week 13 through week 28, months 4 through 6. The second trimester is often a time when you feel your best. Your body has also adjusted to being pregnant, and you begin to feel better physically. Usually, morning sickness has lessened or quit completely, you may have more energy, and you may have an increase in appetite. The second trimester is also a time when the fetus is growing rapidly. At the end of the sixth month, the fetus is about 9 inches long and weighs about 1 pounds. You will likely begin to feel the baby move (quickening) between 18 and 20 weeks of the pregnancy. BODY CHANGES Your body goes through many changes during pregnancy. The changes vary from woman to woman.   Your weight will continue to increase. You will notice your lower abdomen bulging out.  You may begin to get stretch marks on your hips, abdomen, and breasts.  You may develop headaches that can be relieved by medicines approved by your health care provider.  You may urinate more often because the fetus is pressing on your bladder.  You may develop or continue to have heartburn as a result of your pregnancy.  You may develop constipation because certain hormones are causing the muscles that push waste through your intestines to slow down.  You may develop hemorrhoids or swollen, bulging veins (varicose veins).  You may have back pain because of the weight gain and pregnancy hormones relaxing your joints between the bones in your pelvis and as a result of a shift in weight and the muscles that support your balance.  Your breasts will continue to grow and be tender.  Your gums may bleed and may be sensitive to brushing and flossing.  Dark spots or blotches (chloasma, mask of pregnancy) may develop on your face. This will likely fade after the baby is born.  A dark line from your belly button to the pubic area (linea nigra) may appear. This will likely  fade after the baby is born.  You may have changes in your hair. These can include thickening of your hair, rapid growth, and changes in texture. Some women also have hair loss during or after pregnancy, or hair that feels dry or thin. Your hair will most likely return to normal after your baby is born. WHAT TO EXPECT AT YOUR PRENATAL VISITS During a routine prenatal visit:  You will be weighed to make sure you and the fetus are growing normally.  Your blood pressure will be taken.  Your abdomen will be measured to track your baby's growth.  The fetal heartbeat will be listened to.  Any test results from the previous visit will be discussed. Your health care provider may ask you:  How you are feeling.  If you are feeling the baby move.  If you have had any abnormal symptoms, such as leaking fluid, bleeding, severe headaches, or abdominal cramping.  If you have any questions. Other tests that may be performed during your second trimester include:  Blood tests that check for:  Low iron levels (anemia).  Gestational diabetes (between 24 and 28 weeks).  Rh antibodies.  Urine tests to check for infections, diabetes, or protein in the urine.  An ultrasound to confirm the proper growth and development of the baby.  An amniocentesis to check for possible genetic problems.  Fetal screens for spina bifida and Down syndrome. HOME CARE INSTRUCTIONS   Avoid all smoking, herbs, alcohol, and unprescribed   drugs. These chemicals affect the formation and growth of the baby.  Follow your health care provider's instructions regarding medicine use. There are medicines that are either safe or unsafe to take during pregnancy.  Exercise only as directed by your health care provider. Experiencing uterine cramps is a good sign to stop exercising.  Continue to eat regular, healthy meals.  Wear a good support bra for breast tenderness.  Do not use hot tubs, steam rooms, or saunas.  Wear  your seat belt at all times when driving.  Avoid raw meat, uncooked cheese, cat litter boxes, and soil used by cats. These carry germs that can cause birth defects in the baby.  Take your prenatal vitamins.  Try taking a stool softener (if your health care provider approves) if you develop constipation. Eat more high-fiber foods, such as fresh vegetables or fruit and whole grains. Drink plenty of fluids to keep your urine clear or pale yellow.  Take warm sitz baths to soothe any pain or discomfort caused by hemorrhoids. Use hemorrhoid cream if your health care provider approves.  If you develop varicose veins, wear support hose. Elevate your feet for 15 minutes, 3-4 times a day. Limit salt in your diet.  Avoid heavy lifting, wear low heel shoes, and practice good posture.  Rest with your legs elevated if you have leg cramps or low back pain.  Visit your dentist if you have not gone yet during your pregnancy. Use a soft toothbrush to brush your teeth and be gentle when you floss.  A sexual relationship may be continued unless your health care provider directs you otherwise.  Continue to go to all your prenatal visits as directed by your health care provider. SEEK MEDICAL CARE IF:   You have dizziness.  You have mild pelvic cramps, pelvic pressure, or nagging pain in the abdominal area.  You have persistent nausea, vomiting, or diarrhea.  You have a bad smelling vaginal discharge.  You have pain with urination. SEEK IMMEDIATE MEDICAL CARE IF:   You have a fever.  You are leaking fluid from your vagina.  You have spotting or bleeding from your vagina.  You have severe abdominal cramping or pain.  You have rapid weight gain or loss.  You have shortness of breath with chest pain.  You notice sudden or extreme swelling of your face, hands, ankles, feet, or legs.  You have not felt your baby move in over an hour.  You have severe headaches that do not go away with  medicine.  You have vision changes. Document Released: 03/31/2001 Document Revised: 04/11/2013 Document Reviewed: 06/07/2012 [employer] Patient Information 2015 [employer]. This information is not intended to replace advice given to you by your health care provider. Make sure you discuss any questions you have with your health care provider.  Breastfeeding Deciding to breastfeed is one of the best choices you can make for you and your baby. A change in hormones during pregnancy causes your breast tissue to grow and increases the number and size of your milk ducts. These hormones also allow proteins, sugars, and fats from your blood supply to make breast milk in your milk-producing glands. Hormones prevent breast milk from being released before your baby is born as well as prompt milk flow after birth. Once breastfeeding has begun, thoughts of your baby, as well as his or her sucking or crying, can stimulate the release of milk from your milk-producing glands.  BENEFITS OF BREASTFEEDING For Your Baby  Your first   milk (colostrum) helps your baby's digestive system function better.   There are antibodies in your milk that help your baby fight off infections.   Your baby has a lower incidence of asthma, allergies, and sudden infant death syndrome.   The nutrients in breast milk are better for your baby than infant formulas and are designed uniquely for your baby's needs.   Breast milk improves your baby's brain development.   Your baby is less likely to develop other conditions, such as childhood obesity, asthma, or type 2 diabetes mellitus.  For You   Breastfeeding helps to create a very special bond between you and your baby.   Breastfeeding is convenient. Breast milk is always available at the correct temperature and costs nothing.   Breastfeeding helps to burn calories and helps you lose the weight gained during pregnancy.   Breastfeeding makes your uterus contract to its  prepregnancy size faster and slows bleeding (lochia) after you give birth.   Breastfeeding helps to lower your risk of developing type 2 diabetes mellitus, osteoporosis, and breast or ovarian cancer later in life. SIGNS THAT YOUR BABY IS HUNGRY Early Signs of Hunger  Increased alertness or activity.  Stretching.  Movement of the head from side to side.  Movement of the head and opening of the mouth when the corner of the mouth or cheek is stroked (rooting).  Increased sucking sounds, smacking lips, cooing, sighing, or squeaking.  Hand-to-mouth movements.  Increased sucking of fingers or hands. Late Signs of Hunger  Fussing.  Intermittent crying. Extreme Signs of Hunger Signs of extreme hunger will require calming and consoling before your baby will be able to breastfeed successfully. Do not wait for the following signs of extreme hunger to occur before you initiate breastfeeding:   Restlessness.  A loud, strong cry.   Screaming. BREASTFEEDING BASICS Breastfeeding Initiation  Find a comfortable place to sit or lie down, with your neck and back well supported.  Place a pillow or rolled up blanket under your baby to bring him or her to the level of your breast (if you are seated). Nursing pillows are specially designed to help support your arms and your baby while you breastfeed.  Make sure that your baby's abdomen is facing your abdomen.   Gently massage your breast. With your fingertips, massage from your chest wall toward your nipple in a circular motion. This encourages milk flow. You may need to continue this action during the feeding if your milk flows slowly.  Support your breast with 4 fingers underneath and your thumb above your nipple. Make sure your fingers are well away from your nipple and your baby's mouth.   Stroke your baby's lips gently with your finger or nipple.   When your baby's mouth is open wide enough, quickly bring your baby to your breast,  placing your entire nipple and as much of the colored area around your nipple (areola) as possible into your baby's mouth.   More areola should be visible above your baby's upper lip than below the lower lip.   Your baby's tongue should be between his or her lower gum and your breast.   Ensure that your baby's mouth is correctly positioned around your nipple (latched). Your baby's lips should create a seal on your breast and be turned out (everted).  It is common for your baby to suck about 2-3 minutes in order to start the flow of breast milk. Latching Teaching your baby how to latch on to your breast   properly is very important. An improper latch can cause nipple pain and decreased milk supply for you and poor weight gain in your baby. Also, if your baby is not latched onto your nipple properly, he or she may swallow some air during feeding. This can make your baby fussy. Burping your baby when you switch breasts during the feeding can help to get rid of the air. However, teaching your baby to latch on properly is still the best way to prevent fussiness from swallowing air while breastfeeding. Signs that your baby has successfully latched on to your nipple:    Silent tugging or silent sucking, without causing you pain.   Swallowing heard between every 3-4 sucks.    Muscle movement above and in front of his or her ears while sucking.  Signs that your baby has not successfully latched on to nipple:   Sucking sounds or smacking sounds from your baby while breastfeeding.  Nipple pain. If you think your baby has not latched on correctly, slip your finger into the corner of your baby's mouth to break the suction and place it between your baby's gums. Attempt breastfeeding initiation again. Signs of Successful Breastfeeding Signs from your baby:   A gradual decrease in the number of sucks or complete cessation of sucking.   Falling asleep.   Relaxation of his or her body.    Retention of a small amount of milk in his or her mouth.   Letting go of your breast by himself or herself. Signs from you:  Breasts that have increased in firmness, weight, and size 1-3 hours after feeding.   Breasts that are softer immediately after breastfeeding.  Increased milk volume, as well as a change in milk consistency and color by the fifth day of breastfeeding.   Nipples that are not sore, cracked, or bleeding. Signs That Your Baby is Getting Enough Milk  Wetting at least 3 diapers in a 24-hour period. The urine should be clear and pale yellow by age 5 days.  At least 3 stools in a 24-hour period by age 5 days. The stool should be soft and yellow.  At least 3 stools in a 24-hour period by age 7 days. The stool should be seedy and yellow.  No loss of weight greater than 10% of birth weight during the first 3 days of age.  Average weight gain of 4-7 ounces (113-198 g) per week after age 4 days.  Consistent daily weight gain by age 5 days, without weight loss after the age of 2 weeks. After a feeding, your baby may spit up a small amount. This is common. BREASTFEEDING FREQUENCY AND DURATION Frequent feeding will help you make more milk and can prevent sore nipples and breast engorgement. Breastfeed when you feel the need to reduce the fullness of your breasts or when your baby shows signs of hunger. This is called "breastfeeding on demand." Avoid introducing a pacifier to your baby while you are working to establish breastfeeding (the first 4-6 weeks after your baby is born). After this time you may choose to use a pacifier. Research has shown that pacifier use during the first year of a baby's life decreases the risk of sudden infant death syndrome (SIDS). Allow your baby to feed on each breast as long as he or she wants. Breastfeed until your baby is finished feeding. When your baby unlatches or falls asleep while feeding from the first breast, offer the second breast.  Because newborns are often sleepy in the   first few weeks of life, you may need to awaken your baby to get him or her to feed. Breastfeeding times will vary from baby to baby. However, the following rules can serve as a guide to help you ensure that your baby is properly fed:  Newborns (babies 4 weeks of age or younger) may breastfeed every 1-3 hours.  Newborns should not go longer than 3 hours during the day or 5 hours during the night without breastfeeding.  You should breastfeed your baby a minimum of 8 times in a 24-hour period until you begin to introduce solid foods to your baby at around 6 months of age. BREAST MILK PUMPING Pumping and storing breast milk allows you to ensure that your baby is exclusively fed your breast milk, even at times when you are unable to breastfeed. This is especially important if you are going back to work while you are still breastfeeding or when you are not able to be present during feedings. Your lactation consultant can give you guidelines on how long it is safe to store breast milk.  A breast pump is a machine that allows you to pump milk from your breast into a sterile bottle. The pumped breast milk can then be stored in a refrigerator or freezer. Some breast pumps are operated by hand, while others use electricity. Ask your lactation consultant which type will work best for you. Breast pumps can be purchased, but some hospitals and breastfeeding support groups lease breast pumps on a monthly basis. A lactation consultant can teach you how to hand express breast milk, if you prefer not to use a pump.  CARING FOR YOUR BREASTS WHILE YOU BREASTFEED Nipples can become dry, cracked, and sore while breastfeeding. The following recommendations can help keep your breasts moisturized and healthy:  Avoid using soap on your nipples.   Wear a supportive bra. Although not required, special nursing bras and tank tops are designed to allow access to your breasts for  breastfeeding without taking off your entire bra or top. Avoid wearing underwire-style bras or extremely tight bras.  Air dry your nipples for 3-4minutes after each feeding.   Use only cotton bra pads to absorb leaked breast milk. Leaking of breast milk between feedings is normal.   Use lanolin on your nipples after breastfeeding. Lanolin helps to maintain your skin's normal moisture barrier. If you use pure lanolin, you do not need to wash it off before feeding your baby again. Pure lanolin is not toxic to your baby. You may also hand express a few drops of breast milk and gently massage that milk into your nipples and allow the milk to air dry. In the first few weeks after giving birth, some women experience extremely full breasts (engorgement). Engorgement can make your breasts feel heavy, warm, and tender to the touch. Engorgement peaks within 3-5 days after you give birth. The following recommendations can help ease engorgement:  Completely empty your breasts while breastfeeding or pumping. You may want to start by applying warm, moist heat (in the shower or with warm water-soaked hand towels) just before feeding or pumping. This increases circulation and helps the milk flow. If your baby does not completely empty your breasts while breastfeeding, pump any extra milk after he or she is finished.  Wear a snug bra (nursing or regular) or tank top for 1-2 days to signal your body to slightly decrease milk production.  Apply ice packs to your breasts, unless this is too uncomfortable for you.    Make sure that your baby is latched on and positioned properly while breastfeeding. If engorgement persists after 48 hours of following these recommendations, contact your health care provider or a lactation consultant. OVERALL HEALTH CARE RECOMMENDATIONS WHILE BREASTFEEDING  Eat healthy foods. Alternate between meals and snacks, eating 3 of each per day. Because what you eat affects your breast milk,  some of the foods may make your baby more irritable than usual. Avoid eating these foods if you are sure that they are negatively affecting your baby.  Drink milk, fruit juice, and water to satisfy your thirst (about 10 glasses a day).   Rest often, relax, and continue to take your prenatal vitamins to prevent fatigue, stress, and anemia.  Continue breast self-awareness checks.  Avoid chewing and smoking tobacco.  Avoid alcohol and drug use. Some medicines that may be harmful to your baby can pass through breast milk. It is important to ask your health care provider before taking any medicine, including all over-the-counter and prescription medicine as well as vitamin and herbal supplements. It is possible to become pregnant while breastfeeding. If birth control is desired, ask your health care provider about options that will be safe for your baby. SEEK MEDICAL CARE IF:   You feel like you want to stop breastfeeding or have become frustrated with breastfeeding.  You have painful breasts or nipples.  Your nipples are cracked or bleeding.  Your breasts are red, tender, or warm.  You have a swollen area on either breast.  You have a fever or chills.  You have nausea or vomiting.  You have drainage other than breast milk from your nipples.  Your breasts do not become full before feedings by the fifth day after you give birth.  You feel sad and depressed.  Your baby is too sleepy to eat well.  Your baby is having trouble sleeping.   Your baby is wetting less than 3 diapers in a 24-hour period.  Your baby has less than 3 stools in a 24-hour period.  Your baby's skin or the white part of his or her eyes becomes yellow.   Your baby is not gaining weight by 5 days of age. SEEK IMMEDIATE MEDICAL CARE IF:   Your baby is overly tired (lethargic) and does not want to wake up and feed.  Your baby develops an unexplained fever. Document Released: 04/06/2005 Document Revised:  04/11/2013 Document Reviewed: 09/28/2012 [employer] Patient Information 2015 [employer]. This information is not intended to replace advice given to you by your health care provider. Make sure you discuss any questions you have with your health care provider.  

## 2014-05-27 LAB — CULTURE, OB URINE

## 2014-05-28 ENCOUNTER — Telehealth: Payer: Self-pay | Admitting: *Deleted

## 2014-05-28 NOTE — Telephone Encounter (Signed)
Patient is not having any symptoms of uti, she will call back if she begins to have symptoms or concerns.

## 2014-06-22 ENCOUNTER — Ambulatory Visit (INDEPENDENT_AMBULATORY_CARE_PROVIDER_SITE_OTHER): Payer: No Typology Code available for payment source | Admitting: Obstetrics & Gynecology

## 2014-06-22 ENCOUNTER — Encounter: Payer: Self-pay | Admitting: Obstetrics & Gynecology

## 2014-06-22 VITALS — BP 111/75 | HR 111 | Wt 211.0 lb

## 2014-06-22 DIAGNOSIS — Z3492 Encounter for supervision of normal pregnancy, unspecified, second trimester: Secondary | ICD-10-CM

## 2014-06-22 NOTE — Patient Instructions (Addendum)
Return to clinic for any obstetric concerns or go to [facility] for evaluation Tdap Vaccine (Tetanus, Diphtheria, Pertussis): What You Need to Know 1. Why get vaccinated? Tetanus, diphtheria and pertussis can be very serious diseases, even for adolescents and adults. Tdap vaccine can protect us from these diseases. TETANUS (Lockjaw) causes painful muscle tightening and stiffness, usually all over the body.  It can lead to tightening of muscles in the head and neck so you can't open your mouth, swallow, or sometimes even breathe. Tetanus kills about 1 out of 5 people who are infected. DIPHTHERIA can cause a thick coating to form in the back of the throat.  It can lead to breathing problems, paralysis, heart failure, and death. PERTUSSIS (Whooping Cough) causes severe coughing spells, which can cause difficulty breathing, vomiting and disturbed sleep.  It can also lead to weight loss, incontinence, and rib fractures. Up to 2 in 100 adolescents and 5 in 100 adults with pertussis are hospitalized or have complications, which could include pneumonia or death. These diseases are caused by bacteria. Diphtheria and pertussis are spread from person to person through coughing or sneezing. Tetanus enters the body through cuts, scratches, or wounds. Before vaccines, the [place] saw as many as 200,000 cases a year of diphtheria and pertussis, and hundreds of cases of tetanus. Since vaccination began, tetanus and diphtheria have dropped by about 99% and pertussis by about 80%. 2. Tdap vaccine Tdap vaccine can protect adolescents and adults from tetanus, diphtheria, and pertussis. One dose of Tdap is routinely given at age 11 or 12. People who did not get Tdap at that age should get it as soon as possible. Tdap is especially important for health care professionals and anyone having close contact with a baby younger than 12 months. Pregnant women should get a dose of Tdap during every pregnancy, to protect the  newborn from pertussis. Infants are most at risk for severe, life-threatening complications from pertussis. A similar vaccine, called Td, protects from tetanus and diphtheria, but not pertussis. A Td booster should be given every 10 years. Tdap may be given as one of these boosters if you have not already gotten a dose. Tdap may also be given after a severe cut or burn to prevent tetanus infection. Your doctor can give you more information. Tdap may safely be given at the same time as other vaccines. 3. Some people should not get this vaccine  If you ever had a life-threatening allergic reaction after a dose of any tetanus, diphtheria, or pertussis containing vaccine, OR if you have a severe allergy to any part of this vaccine, you should not get Tdap. Tell your doctor if you have any severe allergies.  If you had a coma, or long or multiple seizures within 7 days after a childhood dose of DTP or DTaP, you should not get Tdap, unless a cause other than the vaccine was found. You can still get Td.  Talk to your doctor if you:  have epilepsy or another nervous system problem,  had severe pain or swelling after any vaccine containing diphtheria, tetanus or pertussis,  ever had Guillain-Barr Syndrome (GBS),  aren't feeling well on the day the shot is scheduled. 4. Risks of a vaccine reaction With any medicine, including vaccines, there is a chance of side effects. These are usually mild and go away on their own, but serious reactions are also possible. Brief fainting spells can follow a vaccination, leading to injuries from falling. Sitting or lying down for   about 15 minutes can help prevent these. Tell your doctor if you feel dizzy or light-headed, or have vision changes or ringing in the ears. Mild problems following Tdap (Did not interfere with activities)  Pain where the shot was given (about 3 in 4 adolescents or 2 in 3 adults)  Redness or swelling where the shot was given (about 1  person in 5)  Mild fever of at least 100.4F (up to about 1 in 25 adolescents or 1 in 100 adults)  Headache (about 3 or 4 people in 10)  Tiredness (about 1 person in 3 or 4)  Nausea, vomiting, diarrhea, stomach ache (up to 1 in 4 adolescents or 1 in 10 adults)  Chills, body aches, sore joints, rash, swollen glands (uncommon) Moderate problems following Tdap (Interfered with activities, but did not require medical attention)  Pain where the shot was given (about 1 in 5 adolescents or 1 in 100 adults)  Redness or swelling where the shot was given (up to about 1 in 16 adolescents or 1 in 25 adults)  Fever over 102F (about 1 in 100 adolescents or 1 in 250 adults)  Headache (about 3 in 20 adolescents or 1 in 10 adults)  Nausea, vomiting, diarrhea, stomach ache (up to 1 or 3 people in 100)  Swelling of the entire arm where the shot was given (up to about 3 in 100). Severe problems following Tdap (Unable to perform usual activities; required medical attention)  Swelling, severe pain, bleeding and redness in the arm where the shot was given (rare). A severe allergic reaction could occur after any vaccine (estimated less than 1 in a million doses). 5. What if there is a serious reaction? What should I look for?  Look for anything that concerns you, such as signs of a severe allergic reaction, very high fever, or behavior changes. Signs of a severe allergic reaction can include hives, swelling of the face and throat, difficulty breathing, a fast heartbeat, dizziness, and weakness. These would start a few minutes to a few hours after the vaccination. What should I do?  If you think it is a severe allergic reaction or other emergency that can't wait, call 9-1-1 or get the person to the nearest hospital. Otherwise, call your doctor.  Afterward, the reaction should be reported to the "Vaccine Adverse Event Reporting System" (VAERS). Your doctor might file this report, or you can do it  yourself through the VAERS web site at www.vaers.hhs.gov, or by calling 1-800-822-7967. VAERS is only for reporting reactions. They do not give medical advice.  6. The National Vaccine Injury Compensation Program The National Vaccine Injury Compensation Program (VICP) is a federal program that was created to compensate people who may have been injured by certain vaccines. Persons who believe they may have been injured by a vaccine can learn about the program and about filing a claim by calling 1-800-338-2382 or visiting the VICP website at www.hrsa.gov/vaccinecompensation. 7. How can I learn more?  Ask your doctor.  Call your local or state health department.  Contact the Centers for Disease Control and Prevention (CDC):  Call 1-800-232-4636 or visit CDC's website at www.cdc.gov/vaccines. CDC Tdap Vaccine VIS (08/27/11) Document Released: 10/06/2011 Document Revised: 08/21/2013 Document Reviewed: 07/19/2013 ExitCare Patient Information 2015 [employer]. This information is not intended to replace advice given to you by your health care provider. Make sure you discuss any questions you have with your health care provider.  

## 2014-06-22 NOTE — Progress Notes (Signed)
N/V is markedly improved, rarely takes Zofran. 1 Hr GTT, labs, Tdap vaccine next visit No other complaints or concerns.  Routine obstetric precautions reviewed.

## 2014-07-23 ENCOUNTER — Ambulatory Visit (INDEPENDENT_AMBULATORY_CARE_PROVIDER_SITE_OTHER): Payer: Self-pay | Admitting: Obstetrics & Gynecology

## 2014-07-23 VITALS — BP 110/73 | HR 109 | Wt 216.0 lb

## 2014-07-23 DIAGNOSIS — O99891 Other specified diseases and conditions complicating pregnancy: Secondary | ICD-10-CM

## 2014-07-23 DIAGNOSIS — O26893 Other specified pregnancy related conditions, third trimester: Secondary | ICD-10-CM

## 2014-07-23 DIAGNOSIS — N898 Other specified noninflammatory disorders of vagina: Secondary | ICD-10-CM

## 2014-07-23 DIAGNOSIS — O9989 Other specified diseases and conditions complicating pregnancy, childbirth and the puerperium: Secondary | ICD-10-CM

## 2014-07-23 DIAGNOSIS — M549 Dorsalgia, unspecified: Secondary | ICD-10-CM

## 2014-07-23 DIAGNOSIS — Z36 Encounter for antenatal screening of mother: Secondary | ICD-10-CM

## 2014-07-23 DIAGNOSIS — Z3493 Encounter for supervision of normal pregnancy, unspecified, third trimester: Secondary | ICD-10-CM

## 2014-07-23 LAB — POCT URINALYSIS DIPSTICK
Bilirubin, UA: NEGATIVE
Glucose, UA: NEGATIVE
Leukocytes, UA: NEGATIVE
Nitrite, UA: NEGATIVE
Protein, UA: NEGATIVE
RBC UA: NEGATIVE
Urobilinogen, UA: NEGATIVE
pH, UA: 5

## 2014-07-23 NOTE — Patient Instructions (Addendum)
Return to clinic for any obstetric concerns or go to [facility] for evaluation Thank you for enrolling in MyChart. Please follow the instructions below to securely access your online medical record. MyChart allows you to send messages to your doctor, view your test results, manage appointments, and more.   How Do I Sign Up? 1. In your Internet browser, go to [employer] and enter [email or web address]. 2. Click on the Sign Up Now link in the Sign In box. You will see the New Member Sign Up page. 3. Enter your MyChart Access Code exactly as it appears below. You will not need to use this code after you've completed the sign-up process. If you do not sign up before the expiration date, you must request a new code.  MyChart Access Code: [email or web address] Expires: 09/07/2014  3:21 AM  4. Enter your Social Security Number ([id number]) and Date of Birth (mm/dd/yyyy) as indicated and click Submit. You will be taken to the next sign-up page. 5. Create a MyChart ID. This will be your MyChart login ID and cannot be changed, so think of one that is secure and easy to remember. 6. Create a MyChart password. You can change your password at any time. 7. Enter your Password Reset Question and Answer. This can be used at a later time if you forget your password.  8. Enter your e-mail address. You will receive e-mail notification when new information is available in MyChart. 9. Click Sign Up. You can now view your medical record.   Additional Information Remember, MyChart is NOT to be used for urgent needs. For medical emergencies, dial 911.   Tdap Vaccine (Tetanus, Diphtheria, Pertussis): What You Need to Know 1. Why get vaccinated? Tetanus, diphtheria and pertussis can be very serious diseases, even for adolescents and adults. Tdap vaccine can protect us from these diseases. TETANUS (Lockjaw) causes painful muscle tightening and stiffness, usually all over the body.  It can lead to tightening of  muscles in the head and neck so you can't open your mouth, swallow, or sometimes even breathe. Tetanus kills about 1 out of 5 people who are infected. DIPHTHERIA can cause a thick coating to form in the back of the throat.  It can lead to breathing problems, paralysis, heart failure, and death. PERTUSSIS (Whooping Cough) causes severe coughing spells, which can cause difficulty breathing, vomiting and disturbed sleep.  It can also lead to weight loss, incontinence, and rib fractures. Up to 2 in 100 adolescents and 5 in 100 adults with pertussis are hospitalized or have complications, which could include pneumonia or death. These diseases are caused by bacteria. Diphtheria and pertussis are spread from person to person through coughing or sneezing. Tetanus enters the body through cuts, scratches, or wounds. Before vaccines, the [place] saw as many as 200,000 cases a year of diphtheria and pertussis, and hundreds of cases of tetanus. Since vaccination began, tetanus and diphtheria have dropped by about 99% and pertussis by about 80%. 2. Tdap vaccine Tdap vaccine can protect adolescents and adults from tetanus, diphtheria, and pertussis. One dose of Tdap is routinely given at age 27 or 3212. People who did not get Tdap at that age should get it as soon as possible. Tdap is especially important for health care professionals and anyone having close contact with a baby younger than 12 months. Pregnant women should get a dose of Tdap during every pregnancy, to protect the newborn from pertussis. Infants are most at risk for severe, life-threatening complications from pertussis.  A similar vaccine, called Td, protects from tetanus and diphtheria, but not pertussis. A Td booster should be given every 10 years. Tdap may be given as one of these boosters if you have not already gotten a dose. Tdap may also be given after a severe cut or burn to prevent tetanus infection. Your doctor can give you more  information. Tdap may safely be given at the same time as other vaccines. 3. Some people should not get this vaccine  If you ever had a life-threatening allergic reaction after a dose of any tetanus, diphtheria, or pertussis containing vaccine, OR if you have a severe allergy to any part of this vaccine, you should not get Tdap. Tell your doctor if you have any severe allergies.  If you had a coma, or long or multiple seizures within 7 days after a childhood dose of DTP or DTaP, you should not get Tdap, unless a cause other than the vaccine was found. You can still get Td.  Talk to your doctor if you:  have epilepsy or another nervous system problem,  had severe pain or swelling after any vaccine containing diphtheria, tetanus or pertussis,  ever had Guillain-Barr Syndrome (GBS),  aren't feeling well on the day the shot is scheduled. 4. Risks of a vaccine reaction With any medicine, including vaccines, there is a chance of side effects. These are usually mild and go away on their own, but serious reactions are also possible. Brief fainting spells can follow a vaccination, leading to injuries from falling. Sitting or lying down for about 15 minutes can help prevent these. Tell your doctor if you feel dizzy or light-headed, or have vision changes or ringing in the ears. Mild problems following Tdap (Did not interfere with activities)  Pain where the shot was given (about 3 in 4 adolescents or 2 in 3 adults)  Redness or swelling where the shot was given (about 1 person in 5)  Mild fever of at least 100.51F (up to about 1 in 25 adolescents or 1 in 100 adults)  Headache (about 3 or 4 people in 10)  Tiredness (about 1 person in 3 or 4)  Nausea, vomiting, diarrhea, stomach ache (up to 1 in 4 adolescents or 1 in 10 adults)  Chills, body aches, sore joints, rash, swollen glands (uncommon) Moderate problems following Tdap (Interfered with activities, but did not require medical  attention)  Pain where the shot was given (about 1 in 5 adolescents or 1 in 100 adults)  Redness or swelling where the shot was given (up to about 1 in 16 adolescents or 1 in 25 adults)  Fever over 102F (about 1 in 100 adolescents or 1 in 250 adults)  Headache (about 3 in 20 adolescents or 1 in 10 adults)  Nausea, vomiting, diarrhea, stomach ache (up to 1 or 3 people in 100)  Swelling of the entire arm where the shot was given (up to about 3 in 100). Severe problems following Tdap (Unable to perform usual activities; required medical attention)  Swelling, severe pain, bleeding and redness in the arm where the shot was given (rare). A severe allergic reaction could occur after any vaccine (estimated less than 1 in a million doses). 5. What if there is a serious reaction? What should I look for?  Look for anything that concerns you, such as signs of a severe allergic reaction, very high fever, or behavior changes. Signs of a severe allergic reaction can include hives, swelling of the face and throat, difficulty  breathing, a fast heartbeat, dizziness, and weakness. These would start a few minutes to a few hours after the vaccination. What should I do?  If you think it is a severe allergic reaction or other emergency that can't wait, call 9-1-1 or get the person to the nearest hospital. Otherwise, call your doctor.  Afterward, the reaction should be reported to the "Vaccine Adverse Event Reporting System" (VAERS). Your doctor might file this report, or you can do it yourself through the VAERS web site at www.vaers.LAgents.no, or by calling 1-8604718752. VAERS is only for reporting reactions. They do not give medical advice.  6. The National Vaccine Injury Compensation Program The [employer] Vaccine Injury Compensation Program (VICP) is a federal program that was created to compensate people who may have been injured by certain vaccines. Persons who believe they may have been injured by a  vaccine can learn about the program and about filing a claim by calling 1-(806)829-2312 or visiting the VICP website at SpiritualWord.at. 7. How can I learn more?  Ask your doctor.  Call your local or state health department.  Contact the Centers for Disease Control and Prevention (CDC):  Call 281-846-6618 or visit CDC's website at PicCapture.uy. CDC Tdap Vaccine VIS (08/27/11) Document Released: 10/06/2011 Document Revised: 08/21/2013 Document Reviewed: 07/19/2013 ExitCare Patient Information 2015 [place], [place]. This information is not intended to replace advice given to you by your health care provider. Make sure you discuss any questions you have with your health care provider.

## 2014-07-23 NOTE — Progress Notes (Signed)
White-yellow discharge seen, wet prep obtained, will follow up results and manage accordingly. Third trimester labs today, Tdap vaccine declined, will do further research No other complaints or concerns.  Labor and fetal movement precautions reviewed.

## 2014-07-23 NOTE — Progress Notes (Signed)
Having lots of pelvic pressure, vaginal irritation with itching and burning.  Back pain.

## 2014-07-24 ENCOUNTER — Telehealth: Payer: Self-pay

## 2014-07-24 DIAGNOSIS — B9689 Other specified bacterial agents as the cause of diseases classified elsewhere: Secondary | ICD-10-CM

## 2014-07-24 DIAGNOSIS — N760 Acute vaginitis: Principal | ICD-10-CM

## 2014-07-24 LAB — WET PREP, GENITAL
Trich, Wet Prep: NONE SEEN
YEAST WET PREP: NONE SEEN

## 2014-07-24 LAB — HIV ANTIBODY (ROUTINE TESTING W REFLEX): HIV 1&2 Ab, 4th Generation: NONREACTIVE

## 2014-07-24 LAB — CBC
HEMATOCRIT: 32.5 % — AB (ref 36.0–46.0)
HEMOGLOBIN: 10.7 g/dL — AB (ref 12.0–15.0)
MCH: 29.6 pg (ref 26.0–34.0)
MCHC: 32.9 g/dL (ref 30.0–36.0)
MCV: 89.8 fL (ref 78.0–100.0)
MPV: 11.1 fL (ref 8.6–12.4)
Platelets: 221 10*3/uL (ref 150–400)
RBC: 3.62 MIL/uL — ABNORMAL LOW (ref 3.87–5.11)
RDW: 13.3 % (ref 11.5–15.5)
WBC: 10.2 10*3/uL (ref 4.0–10.5)

## 2014-07-24 LAB — RPR

## 2014-07-24 LAB — GLUCOSE TOLERANCE, 1 HOUR (50G) W/O FASTING: GLUCOSE 1 HOUR GTT: 92 mg/dL (ref 70–140)

## 2014-07-24 MED ORDER — METRONIDAZOLE 500 MG PO TABS
500.0000 mg | ORAL_TABLET | Freq: Two times a day (BID) | ORAL | Status: DC
Start: 2014-07-24 — End: 2014-08-06

## 2014-07-24 NOTE — Telephone Encounter (Signed)
Flagyl 500mg  BID X 7 days e-prescribed per protocol. Called patient and informed her of results and RX. Patient verbalized understanding and gratitude. No questions or concerns.

## 2014-07-24 NOTE — Telephone Encounter (Signed)
-----   Message from Reva Boresanya S Pratt, MD sent at 07/24/2014  9:36 AM EDT ----- Looks like BV--Flagyl 500 mg po bid x 7 day # 14

## 2014-07-27 ENCOUNTER — Encounter: Payer: No Typology Code available for payment source | Admitting: Obstetrics and Gynecology

## 2014-08-06 ENCOUNTER — Ambulatory Visit (INDEPENDENT_AMBULATORY_CARE_PROVIDER_SITE_OTHER): Payer: PRIVATE HEALTH INSURANCE | Admitting: Obstetrics & Gynecology

## 2014-08-06 VITALS — BP 126/75 | HR 112 | Wt 216.0 lb

## 2014-08-06 DIAGNOSIS — R000 Tachycardia, unspecified: Secondary | ICD-10-CM

## 2014-08-06 DIAGNOSIS — Z3493 Encounter for supervision of normal pregnancy, unspecified, third trimester: Secondary | ICD-10-CM

## 2014-08-06 NOTE — Patient Instructions (Signed)
Return to clinic for any obstetric concerns or go to [facility] for evaluation  

## 2014-08-06 NOTE — Progress Notes (Signed)
Persistent mild tachycardia, already had cardiac evaluation prior to pregnancy that was negative. Normal third trimester labs, results reviewed with patient and her husband No other complaints or concerns.  Labor and fetal movement precautions reviewed.

## 2014-08-20 ENCOUNTER — Ambulatory Visit (INDEPENDENT_AMBULATORY_CARE_PROVIDER_SITE_OTHER): Payer: PRIVATE HEALTH INSURANCE | Admitting: Family Medicine

## 2014-08-20 ENCOUNTER — Encounter: Payer: Self-pay | Admitting: Family Medicine

## 2014-08-20 VITALS — BP 113/74 | HR 118 | Wt 217.6 lb

## 2014-08-20 DIAGNOSIS — Z3493 Encounter for supervision of normal pregnancy, unspecified, third trimester: Secondary | ICD-10-CM

## 2014-08-20 DIAGNOSIS — F413 Other mixed anxiety disorders: Secondary | ICD-10-CM

## 2014-08-20 NOTE — Progress Notes (Signed)
Reports increasing stress--has h/o depression and anxiety and had panic attack last week.  Previously on Lexapro and Xanax. Discussed risks/benefits of SSRI's with pt. She will discuss with her husband and see if she would like to start something prior to pp.

## 2014-08-20 NOTE — Progress Notes (Signed)
FMLA forms filled out and given to the patient.

## 2014-08-20 NOTE — Patient Instructions (Signed)
Third Trimester of Pregnancy The third trimester is from week 29 through week 42, months 7 through 9. The third trimester is a time when the fetus is growing rapidly. At the end of the ninth month, the fetus is about 20 inches in length and weighs 6-10 pounds.  BODY CHANGES Your body goes through many changes during pregnancy. The changes vary from woman to woman.   Your weight will continue to increase. You can expect to gain 25-35 pounds (11-16 kg) by the end of the pregnancy.  You may begin to get stretch marks on your hips, abdomen, and breasts.  You may urinate more often because the fetus is moving lower into your pelvis and pressing on your bladder.  You may develop or continue to have heartburn as a result of your pregnancy.  You may develop constipation because certain hormones are causing the muscles that push waste through your intestines to slow down.  You may develop hemorrhoids or swollen, bulging veins (varicose veins).  You may have pelvic pain because of the weight gain and pregnancy hormones relaxing your joints between the bones in your pelvis. Backaches may result from overexertion of the muscles supporting your posture.  You may have changes in your hair. These can include thickening of your hair, rapid growth, and changes in texture. Some women also have hair loss during or after pregnancy, or hair that feels dry or thin. Your hair will most likely return to normal after your baby is born.  Your breasts will continue to grow and be tender. A yellow discharge may leak from your breasts called colostrum.  Your belly button may stick out.  You may feel short of breath because of your expanding uterus.  You may notice the fetus "dropping," or moving lower in your abdomen.  You may have a bloody mucus discharge. This usually occurs a few days to a week before labor begins.  Your cervix becomes thin and soft (effaced) near your due date. WHAT TO EXPECT AT YOUR  PRENATAL EXAMS  You will have prenatal exams every 2 weeks until week 36. Then, you will have weekly prenatal exams. During a routine prenatal visit:  You will be weighed to make sure you and the fetus are growing normally.  Your blood pressure is taken.  Your abdomen will be measured to track your baby's growth.  The fetal heartbeat will be listened to.  Any test results from the previous visit will be discussed.  You may have a cervical check near your due date to see if you have effaced. At around 36 weeks, your caregiver will check your cervix. At the same time, your caregiver will also perform a test on the secretions of the vaginal tissue. This test is to determine if a type of bacteria, Group B streptococcus, is present. Your caregiver will explain this further. Your caregiver may ask you:  What your birth plan is.  How you are feeling.  If you are feeling the baby move.  If you have had any abnormal symptoms, such as leaking fluid, bleeding, severe headaches, or abdominal cramping.  If you have any questions. Other tests or screenings that may be performed during your third trimester include:  Blood tests that check for low iron levels (anemia).  Fetal testing to check the health, activity level, and growth of the fetus. Testing is done if you have certain medical conditions or if there are problems during the pregnancy. FALSE LABOR You may feel small, irregular contractions that   eventually go away. These are called Braxton Hicks contractions, or false labor. Contractions may last for hours, days, or even weeks before true labor sets in. If contractions come at regular intervals, intensify, or become painful, it is best to be seen by your caregiver.  SIGNS OF LABOR   Menstrual-like cramps.  Contractions that are 5 minutes apart or less.  Contractions that start on the top of the uterus and spread down to the lower abdomen and back.  A sense of increased pelvic  pressure or back pain.  A watery or bloody mucus discharge that comes from the vagina. If you have any of these signs before the 37th week of pregnancy, call your caregiver right away. You need to go to the hospital to get checked immediately. HOME CARE INSTRUCTIONS   Avoid all smoking, herbs, alcohol, and unprescribed drugs. These chemicals affect the formation and growth of the baby.  Follow your caregiver's instructions regarding medicine use. There are medicines that are either safe or unsafe to take during pregnancy.  Exercise only as directed by your caregiver. Experiencing uterine cramps is a good sign to stop exercising.  Continue to eat regular, healthy meals.  Wear a good support bra for breast tenderness.  Do not use hot tubs, steam rooms, or saunas.  Wear your seat belt at all times when driving.  Avoid raw meat, uncooked cheese, cat litter boxes, and soil used by cats. These carry germs that can cause birth defects in the baby.  Take your prenatal vitamins.  Try taking a stool softener (if your caregiver approves) if you develop constipation. Eat more high-fiber foods, such as fresh vegetables or fruit and whole grains. Drink plenty of fluids to keep your urine clear or pale yellow.  Take warm sitz baths to soothe any pain or discomfort caused by hemorrhoids. Use hemorrhoid cream if your caregiver approves.  If you develop varicose veins, wear support hose. Elevate your feet for 15 minutes, 3-4 times a day. Limit salt in your diet.  Avoid heavy lifting, wear low heal shoes, and practice good posture.  Rest a lot with your legs elevated if you have leg cramps or low back pain.  Visit your dentist if you have not gone during your pregnancy. Use a soft toothbrush to brush your teeth and be gentle when you floss.  A sexual relationship may be continued unless your caregiver directs you otherwise.  Do not travel far distances unless it is absolutely necessary and only  with the approval of your caregiver.  Take prenatal classes to understand, practice, and ask questions about the labor and delivery.  Make a trial run to the hospital.  Pack your hospital bag.  Prepare the baby's nursery.  Continue to go to all your prenatal visits as directed by your caregiver. SEEK MEDICAL CARE IF:  You are unsure if you are in labor or if your water has broken.  You have dizziness.  You have mild pelvic cramps, pelvic pressure, or nagging pain in your abdominal area.  You have persistent nausea, vomiting, or diarrhea.  You have a bad smelling vaginal discharge.  You have pain with urination. SEEK IMMEDIATE MEDICAL CARE IF:   You have a fever.  You are leaking fluid from your vagina.  You have spotting or bleeding from your vagina.  You have severe abdominal cramping or pain.  You have rapid weight loss or gain.  You have shortness of breath with chest pain.  You notice sudden or extreme swelling   of your face, hands, ankles, feet, or legs.  You have not felt your baby move in over an hour.  You have severe headaches that do not go away with medicine.  You have vision changes. Document Released: 03/31/2001 Document Revised: 04/11/2013 Document Reviewed: 06/07/2012 [employer] Patient Information 2015 [employer]. This information is not intended to replace advice given to you by your health care provider. Make sure you discuss any questions you have with your health care provider.  Breastfeeding Deciding to breastfeed is one of the best choices you can make for you and your baby. A change in hormones during pregnancy causes your breast tissue to grow and increases the number and size of your milk ducts. These hormones also allow proteins, sugars, and fats from your blood supply to make breast milk in your milk-producing glands. Hormones prevent breast milk from being released before your baby is born as well as prompt milk flow after birth. Once  breastfeeding has begun, thoughts of your baby, as well as his or her sucking or crying, can stimulate the release of milk from your milk-producing glands.  BENEFITS OF BREASTFEEDING For Your Baby  Your first milk (colostrum) helps your baby's digestive system function better.   There are antibodies in your milk that help your baby fight off infections.   Your baby has a lower incidence of asthma, allergies, and sudden infant death syndrome.   The nutrients in breast milk are better for your baby than infant formulas and are designed uniquely for your baby's needs.   Breast milk improves your baby's brain development.   Your baby is less likely to develop other conditions, such as childhood obesity, asthma, or type 2 diabetes mellitus.  For You   Breastfeeding helps to create a very special bond between you and your baby.   Breastfeeding is convenient. Breast milk is always available at the correct temperature and costs nothing.   Breastfeeding helps to burn calories and helps you lose the weight gained during pregnancy.   Breastfeeding makes your uterus contract to its prepregnancy size faster and slows bleeding (lochia) after you give birth.   Breastfeeding helps to lower your risk of developing type 2 diabetes mellitus, osteoporosis, and breast or ovarian cancer later in life. SIGNS THAT YOUR BABY IS HUNGRY Early Signs of Hunger  Increased alertness or activity.  Stretching.  Movement of the head from side to side.  Movement of the head and opening of the mouth when the corner of the mouth or cheek is stroked (rooting).  Increased sucking sounds, smacking lips, cooing, sighing, or squeaking.  Hand-to-mouth movements.  Increased sucking of fingers or hands. Late Signs of Hunger  Fussing.  Intermittent crying. Extreme Signs of Hunger Signs of extreme hunger will require calming and consoling before your baby will be able to breastfeed successfully. Do not  wait for the following signs of extreme hunger to occur before you initiate breastfeeding:   Restlessness.  A loud, strong cry.   Screaming. BREASTFEEDING BASICS Breastfeeding Initiation  Find a comfortable place to sit or lie down, with your neck and back well supported.  Place a pillow or rolled up blanket under your baby to bring him or her to the level of your breast (if you are seated). Nursing pillows are specially designed to help support your arms and your baby while you breastfeed.  Make sure that your baby's abdomen is facing your abdomen.   Gently massage your breast. With your fingertips, massage from your chest   wall toward your nipple in a circular motion. This encourages milk flow. You may need to continue this action during the feeding if your milk flows slowly.  Support your breast with 4 fingers underneath and your thumb above your nipple. Make sure your fingers are well away from your nipple and your baby's mouth.   Stroke your baby's lips gently with your finger or nipple.   When your baby's mouth is open wide enough, quickly bring your baby to your breast, placing your entire nipple and as much of the colored area around your nipple (areola) as possible into your baby's mouth.   More areola should be visible above your baby's upper lip than below the lower lip.   Your baby's tongue should be between his or her lower gum and your breast.   Ensure that your baby's mouth is correctly positioned around your nipple (latched). Your baby's lips should create a seal on your breast and be turned out (everted).  It is common for your baby to suck about 2-3 minutes in order to start the flow of breast milk. Latching Teaching your baby how to latch on to your breast properly is very important. An improper latch can cause nipple pain and decreased milk supply for you and poor weight gain in your baby. Also, if your baby is not latched onto your nipple properly, he or she  may swallow some air during feeding. This can make your baby fussy. Burping your baby when you switch breasts during the feeding can help to get rid of the air. However, teaching your baby to latch on properly is still the best way to prevent fussiness from swallowing air while breastfeeding. Signs that your baby has successfully latched on to your nipple:    Silent tugging or silent sucking, without causing you pain.   Swallowing heard between every 3-4 sucks.    Muscle movement above and in front of his or her ears while sucking.  Signs that your baby has not successfully latched on to nipple:   Sucking sounds or smacking sounds from your baby while breastfeeding.  Nipple pain. If you think your baby has not latched on correctly, slip your finger into the corner of your baby's mouth to break the suction and place it between your baby's gums. Attempt breastfeeding initiation again. Signs of Successful Breastfeeding Signs from your baby:   A gradual decrease in the number of sucks or complete cessation of sucking.   Falling asleep.   Relaxation of his or her body.   Retention of a small amount of milk in his or her mouth.   Letting go of your breast by himself or herself. Signs from you:  Breasts that have increased in firmness, weight, and size 1-3 hours after feeding.   Breasts that are softer immediately after breastfeeding.  Increased milk volume, as well as a change in milk consistency and color by the fifth day of breastfeeding.   Nipples that are not sore, cracked, or bleeding. Signs That Your Baby is Getting Enough Milk  Wetting at least 3 diapers in a 24-hour period. The urine should be clear and pale yellow by age 5 days.  At least 3 stools in a 24-hour period by age 5 days. The stool should be soft and yellow.  At least 3 stools in a 24-hour period by age 7 days. The stool should be seedy and yellow.  No loss of weight greater than 10% of birth weight  during the first 3   days of age.  Average weight gain of 4-7 ounces (113-198 g) per week after age 4 days.  Consistent daily weight gain by age 5 days, without weight loss after the age of 2 weeks. After a feeding, your baby may spit up a small amount. This is common. BREASTFEEDING FREQUENCY AND DURATION Frequent feeding will help you make more milk and can prevent sore nipples and breast engorgement. Breastfeed when you feel the need to reduce the fullness of your breasts or when your baby shows signs of hunger. This is called "breastfeeding on demand." Avoid introducing a pacifier to your baby while you are working to establish breastfeeding (the first 4-6 weeks after your baby is born). After this time you may choose to use a pacifier. Research has shown that pacifier use during the first year of a baby's life decreases the risk of sudden infant death syndrome (SIDS). Allow your baby to feed on each breast as long as he or she wants. Breastfeed until your baby is finished feeding. When your baby unlatches or falls asleep while feeding from the first breast, offer the second breast. Because newborns are often sleepy in the first few weeks of life, you may need to awaken your baby to get him or her to feed. Breastfeeding times will vary from baby to baby. However, the following rules can serve as a guide to help you ensure that your baby is properly fed:  Newborns (babies 4 weeks of age or younger) may breastfeed every 1-3 hours.  Newborns should not go longer than 3 hours during the day or 5 hours during the night without breastfeeding.  You should breastfeed your baby a minimum of 8 times in a 24-hour period until you begin to introduce solid foods to your baby at around 6 months of age. BREAST MILK PUMPING Pumping and storing breast milk allows you to ensure that your baby is exclusively fed your breast milk, even at times when you are unable to breastfeed. This is especially important if you are  going back to work while you are still breastfeeding or when you are not able to be present during feedings. Your lactation consultant can give you guidelines on how long it is safe to store breast milk.  A breast pump is a machine that allows you to pump milk from your breast into a sterile bottle. The pumped breast milk can then be stored in a refrigerator or freezer. Some breast pumps are operated by hand, while others use electricity. Ask your lactation consultant which type will work best for you. Breast pumps can be purchased, but some hospitals and breastfeeding support groups lease breast pumps on a monthly basis. A lactation consultant can teach you how to hand express breast milk, if you prefer not to use a pump.  CARING FOR YOUR BREASTS WHILE YOU BREASTFEED Nipples can become dry, cracked, and sore while breastfeeding. The following recommendations can help keep your breasts moisturized and healthy:  Avoid using soap on your nipples.   Wear a supportive bra. Although not required, special nursing bras and tank tops are designed to allow access to your breasts for breastfeeding without taking off your entire bra or top. Avoid wearing underwire-style bras or extremely tight bras.  Air dry your nipples for 3-4minutes after each feeding.   Use only cotton bra pads to absorb leaked breast milk. Leaking of breast milk between feedings is normal.   Use lanolin on your nipples after breastfeeding. Lanolin helps to maintain your skin's   normal moisture barrier. If you use pure lanolin, you do not need to wash it off before feeding your baby again. Pure lanolin is not toxic to your baby. You may also hand express a few drops of breast milk and gently massage that milk into your nipples and allow the milk to air dry. In the first few weeks after giving birth, some women experience extremely full breasts (engorgement). Engorgement can make your breasts feel heavy, warm, and tender to the touch.  Engorgement peaks within 3-5 days after you give birth. The following recommendations can help ease engorgement:  Completely empty your breasts while breastfeeding or pumping. You may want to start by applying warm, moist heat (in the shower or with warm water-soaked hand towels) just before feeding or pumping. This increases circulation and helps the milk flow. If your baby does not completely empty your breasts while breastfeeding, pump any extra milk after he or she is finished.  Wear a snug bra (nursing or regular) or tank top for 1-2 days to signal your body to slightly decrease milk production.  Apply ice packs to your breasts, unless this is too uncomfortable for you.  Make sure that your baby is latched on and positioned properly while breastfeeding. If engorgement persists after 48 hours of following these recommendations, contact your health care provider or a lactation consultant. OVERALL HEALTH CARE RECOMMENDATIONS WHILE BREASTFEEDING  Eat healthy foods. Alternate between meals and snacks, eating 3 of each per day. Because what you eat affects your breast milk, some of the foods may make your baby more irritable than usual. Avoid eating these foods if you are sure that they are negatively affecting your baby.  Drink milk, fruit juice, and water to satisfy your thirst (about 10 glasses a day).   Rest often, relax, and continue to take your prenatal vitamins to prevent fatigue, stress, and anemia.  Continue breast self-awareness checks.  Avoid chewing and smoking tobacco.  Avoid alcohol and drug use. Some medicines that may be harmful to your baby can pass through breast milk. It is important to ask your health care provider before taking any medicine, including all over-the-counter and prescription medicine as well as vitamin and herbal supplements. It is possible to become pregnant while breastfeeding. If birth control is desired, ask your health care provider about options that  will be safe for your baby. SEEK MEDICAL CARE IF:   You feel like you want to stop breastfeeding or have become frustrated with breastfeeding.  You have painful breasts or nipples.  Your nipples are cracked or bleeding.  Your breasts are red, tender, or warm.  You have a swollen area on either breast.  You have a fever or chills.  You have nausea or vomiting.  You have drainage other than breast milk from your nipples.  Your breasts do not become full before feedings by the fifth day after you give birth.  You feel sad and depressed.  Your baby is too sleepy to eat well.  Your baby is having trouble sleeping.   Your baby is wetting less than 3 diapers in a 24-hour period.  Your baby has less than 3 stools in a 24-hour period.  Your baby's skin or the white part of his or her eyes becomes yellow.   Your baby is not gaining weight by 5 days of age. SEEK IMMEDIATE MEDICAL CARE IF:   Your baby is overly tired (lethargic) and does not want to wake up and feed.  Your baby   develops an unexplained fever. Document Released: 04/06/2005 Document Revised: 04/11/2013 Document Reviewed: 09/28/2012 [employer] Patient Information 2015 [employer]. This information is not intended to replace advice given to you by your health care provider. Make sure you discuss any questions you have with your health care provider.  

## 2014-08-27 ENCOUNTER — Ambulatory Visit (INDEPENDENT_AMBULATORY_CARE_PROVIDER_SITE_OTHER): Payer: PRIVATE HEALTH INSURANCE | Admitting: Family Medicine

## 2014-08-27 VITALS — BP 108/74 | HR 118 | Wt 219.0 lb

## 2014-08-27 DIAGNOSIS — Z3493 Encounter for supervision of normal pregnancy, unspecified, third trimester: Secondary | ICD-10-CM

## 2014-08-27 NOTE — Progress Notes (Signed)
Reports she does not want medication for depression and anxiety Her husband is helping to fix the anxiety.

## 2014-08-27 NOTE — Patient Instructions (Signed)
Third Trimester of Pregnancy The third trimester is from week 29 through week 42, months 7 through 9. The third trimester is a time when the fetus is growing rapidly. At the end of the ninth month, the fetus is about 20 inches in length and weighs 6-10 pounds.  BODY CHANGES Your body goes through many changes during pregnancy. The changes vary from woman to woman.   Your weight will continue to increase. You can expect to gain 25-35 pounds (11-16 kg) by the end of the pregnancy.  You may begin to get stretch marks on your hips, abdomen, and breasts.  You may urinate more often because the fetus is moving lower into your pelvis and pressing on your bladder.  You may develop or continue to have heartburn as a result of your pregnancy.  You may develop constipation because certain hormones are causing the muscles that push waste through your intestines to slow down.  You may develop hemorrhoids or swollen, bulging veins (varicose veins).  You may have pelvic pain because of the weight gain and pregnancy hormones relaxing your joints between the bones in your pelvis. Backaches may result from overexertion of the muscles supporting your posture.  You may have changes in your hair. These can include thickening of your hair, rapid growth, and changes in texture. Some women also have hair loss during or after pregnancy, or hair that feels dry or thin. Your hair will most likely return to normal after your baby is born.  Your breasts will continue to grow and be tender. A yellow discharge may leak from your breasts called colostrum.  Your belly button may stick out.  You may feel short of breath because of your expanding uterus.  You may notice the fetus "dropping," or moving lower in your abdomen.  You may have a bloody mucus discharge. This usually occurs a few days to a week before labor begins.  Your cervix becomes thin and soft (effaced) near your due date. WHAT TO EXPECT AT YOUR  PRENATAL EXAMS  You will have prenatal exams every 2 weeks until week 36. Then, you will have weekly prenatal exams. During a routine prenatal visit:  You will be weighed to make sure you and the fetus are growing normally.  Your blood pressure is taken.  Your abdomen will be measured to track your baby's growth.  The fetal heartbeat will be listened to.  Any test results from the previous visit will be discussed.  You may have a cervical check near your due date to see if you have effaced. At around 36 weeks, your caregiver will check your cervix. At the same time, your caregiver will also perform a test on the secretions of the vaginal tissue. This test is to determine if a type of bacteria, Group B streptococcus, is present. Your caregiver will explain this further. Your caregiver may ask you:  What your birth plan is.  How you are feeling.  If you are feeling the baby move.  If you have had any abnormal symptoms, such as leaking fluid, bleeding, severe headaches, or abdominal cramping.  If you have any questions. Other tests or screenings that may be performed during your third trimester include:  Blood tests that check for low iron levels (anemia).  Fetal testing to check the health, activity level, and growth of the fetus. Testing is done if you have certain medical conditions or if there are problems during the pregnancy. FALSE LABOR You may feel small, irregular contractions that   eventually go away. These are called Braxton Hicks contractions, or false labor. Contractions may last for hours, days, or even weeks before true labor sets in. If contractions come at regular intervals, intensify, or become painful, it is best to be seen by your caregiver.  SIGNS OF LABOR   Menstrual-like cramps.  Contractions that are 5 minutes apart or less.  Contractions that start on the top of the uterus and spread down to the lower abdomen and back.  A sense of increased pelvic  pressure or back pain.  A watery or bloody mucus discharge that comes from the vagina. If you have any of these signs before the 37th week of pregnancy, call your caregiver right away. You need to go to the hospital to get checked immediately. HOME CARE INSTRUCTIONS   Avoid all smoking, herbs, alcohol, and unprescribed drugs. These chemicals affect the formation and growth of the baby.  Follow your caregiver's instructions regarding medicine use. There are medicines that are either safe or unsafe to take during pregnancy.  Exercise only as directed by your caregiver. Experiencing uterine cramps is a good sign to stop exercising.  Continue to eat regular, healthy meals.  Wear a good support bra for breast tenderness.  Do not use hot tubs, steam rooms, or saunas.  Wear your seat belt at all times when driving.  Avoid raw meat, uncooked cheese, cat litter boxes, and soil used by cats. These carry germs that can cause birth defects in the baby.  Take your prenatal vitamins.  Try taking a stool softener (if your caregiver approves) if you develop constipation. Eat more high-fiber foods, such as fresh vegetables or fruit and whole grains. Drink plenty of fluids to keep your urine clear or pale yellow.  Take warm sitz baths to soothe any pain or discomfort caused by hemorrhoids. Use hemorrhoid cream if your caregiver approves.  If you develop varicose veins, wear support hose. Elevate your feet for 15 minutes, 3-4 times a day. Limit salt in your diet.  Avoid heavy lifting, wear low heal shoes, and practice good posture.  Rest a lot with your legs elevated if you have leg cramps or low back pain.  Visit your dentist if you have not gone during your pregnancy. Use a soft toothbrush to brush your teeth and be gentle when you floss.  A sexual relationship may be continued unless your caregiver directs you otherwise.  Do not travel far distances unless it is absolutely necessary and only  with the approval of your caregiver.  Take prenatal classes to understand, practice, and ask questions about the labor and delivery.  Make a trial run to the hospital.  Pack your hospital bag.  Prepare the baby's nursery.  Continue to go to all your prenatal visits as directed by your caregiver. SEEK MEDICAL CARE IF:  You are unsure if you are in labor or if your water has broken.  You have dizziness.  You have mild pelvic cramps, pelvic pressure, or nagging pain in your abdominal area.  You have persistent nausea, vomiting, or diarrhea.  You have a bad smelling vaginal discharge.  You have pain with urination. SEEK IMMEDIATE MEDICAL CARE IF:   You have a fever.  You are leaking fluid from your vagina.  You have spotting or bleeding from your vagina.  You have severe abdominal cramping or pain.  You have rapid weight loss or gain.  You have shortness of breath with chest pain.  You notice sudden or extreme swelling   of your face, hands, ankles, feet, or legs.  You have not felt your baby move in over an hour.  You have severe headaches that do not go away with medicine.  You have vision changes. Document Released: 03/31/2001 Document Revised: 04/11/2013 Document Reviewed: 06/07/2012 [employer] Patient Information 2015 [employer]. This information is not intended to replace advice given to you by your health care provider. Make sure you discuss any questions you have with your health care provider.  Breastfeeding Deciding to breastfeed is one of the best choices you can make for you and your baby. A change in hormones during pregnancy causes your breast tissue to grow and increases the number and size of your milk ducts. These hormones also allow proteins, sugars, and fats from your blood supply to make breast milk in your milk-producing glands. Hormones prevent breast milk from being released before your baby is born as well as prompt milk flow after birth. Once  breastfeeding has begun, thoughts of your baby, as well as his or her sucking or crying, can stimulate the release of milk from your milk-producing glands.  BENEFITS OF BREASTFEEDING For Your Baby  Your first milk (colostrum) helps your baby's digestive system function better.   There are antibodies in your milk that help your baby fight off infections.   Your baby has a lower incidence of asthma, allergies, and sudden infant death syndrome.   The nutrients in breast milk are better for your baby than infant formulas and are designed uniquely for your baby's needs.   Breast milk improves your baby's brain development.   Your baby is less likely to develop other conditions, such as childhood obesity, asthma, or type 2 diabetes mellitus.  For You   Breastfeeding helps to create a very special bond between you and your baby.   Breastfeeding is convenient. Breast milk is always available at the correct temperature and costs nothing.   Breastfeeding helps to burn calories and helps you lose the weight gained during pregnancy.   Breastfeeding makes your uterus contract to its prepregnancy size faster and slows bleeding (lochia) after you give birth.   Breastfeeding helps to lower your risk of developing type 2 diabetes mellitus, osteoporosis, and breast or ovarian cancer later in life. SIGNS THAT YOUR BABY IS HUNGRY Early Signs of Hunger  Increased alertness or activity.  Stretching.  Movement of the head from side to side.  Movement of the head and opening of the mouth when the corner of the mouth or cheek is stroked (rooting).  Increased sucking sounds, smacking lips, cooing, sighing, or squeaking.  Hand-to-mouth movements.  Increased sucking of fingers or hands. Late Signs of Hunger  Fussing.  Intermittent crying. Extreme Signs of Hunger Signs of extreme hunger will require calming and consoling before your baby will be able to breastfeed successfully. Do not  wait for the following signs of extreme hunger to occur before you initiate breastfeeding:   Restlessness.  A loud, strong cry.   Screaming. BREASTFEEDING BASICS Breastfeeding Initiation  Find a comfortable place to sit or lie down, with your neck and back well supported.  Place a pillow or rolled up blanket under your baby to bring him or her to the level of your breast (if you are seated). Nursing pillows are specially designed to help support your arms and your baby while you breastfeed.  Make sure that your baby's abdomen is facing your abdomen.   Gently massage your breast. With your fingertips, massage from your chest   wall toward your nipple in a circular motion. This encourages milk flow. You may need to continue this action during the feeding if your milk flows slowly.  Support your breast with 4 fingers underneath and your thumb above your nipple. Make sure your fingers are well away from your nipple and your baby's mouth.   Stroke your baby's lips gently with your finger or nipple.   When your baby's mouth is open wide enough, quickly bring your baby to your breast, placing your entire nipple and as much of the colored area around your nipple (areola) as possible into your baby's mouth.   More areola should be visible above your baby's upper lip than below the lower lip.   Your baby's tongue should be between his or her lower gum and your breast.   Ensure that your baby's mouth is correctly positioned around your nipple (latched). Your baby's lips should create a seal on your breast and be turned out (everted).  It is common for your baby to suck about 2-3 minutes in order to start the flow of breast milk. Latching Teaching your baby how to latch on to your breast properly is very important. An improper latch can cause nipple pain and decreased milk supply for you and poor weight gain in your baby. Also, if your baby is not latched onto your nipple properly, he or she  may swallow some air during feeding. This can make your baby fussy. Burping your baby when you switch breasts during the feeding can help to get rid of the air. However, teaching your baby to latch on properly is still the best way to prevent fussiness from swallowing air while breastfeeding. Signs that your baby has successfully latched on to your nipple:    Silent tugging or silent sucking, without causing you pain.   Swallowing heard between every 3-4 sucks.    Muscle movement above and in front of his or her ears while sucking.  Signs that your baby has not successfully latched on to nipple:   Sucking sounds or smacking sounds from your baby while breastfeeding.  Nipple pain. If you think your baby has not latched on correctly, slip your finger into the corner of your baby's mouth to break the suction and place it between your baby's gums. Attempt breastfeeding initiation again. Signs of Successful Breastfeeding Signs from your baby:   A gradual decrease in the number of sucks or complete cessation of sucking.   Falling asleep.   Relaxation of his or her body.   Retention of a small amount of milk in his or her mouth.   Letting go of your breast by himself or herself. Signs from you:  Breasts that have increased in firmness, weight, and size 1-3 hours after feeding.   Breasts that are softer immediately after breastfeeding.  Increased milk volume, as well as a change in milk consistency and color by the fifth day of breastfeeding.   Nipples that are not sore, cracked, or bleeding. Signs That Your Baby is Getting Enough Milk  Wetting at least 3 diapers in a 24-hour period. The urine should be clear and pale yellow by age 5 days.  At least 3 stools in a 24-hour period by age 5 days. The stool should be soft and yellow.  At least 3 stools in a 24-hour period by age 7 days. The stool should be seedy and yellow.  No loss of weight greater than 10% of birth weight  during the first 3   days of age.  Average weight gain of 4-7 ounces (113-198 g) per week after age 4 days.  Consistent daily weight gain by age 5 days, without weight loss after the age of 2 weeks. After a feeding, your baby may spit up a small amount. This is common. BREASTFEEDING FREQUENCY AND DURATION Frequent feeding will help you make more milk and can prevent sore nipples and breast engorgement. Breastfeed when you feel the need to reduce the fullness of your breasts or when your baby shows signs of hunger. This is called "breastfeeding on demand." Avoid introducing a pacifier to your baby while you are working to establish breastfeeding (the first 4-6 weeks after your baby is born). After this time you may choose to use a pacifier. Research has shown that pacifier use during the first year of a baby's life decreases the risk of sudden infant death syndrome (SIDS). Allow your baby to feed on each breast as long as he or she wants. Breastfeed until your baby is finished feeding. When your baby unlatches or falls asleep while feeding from the first breast, offer the second breast. Because newborns are often sleepy in the first few weeks of life, you may need to awaken your baby to get him or her to feed. Breastfeeding times will vary from baby to baby. However, the following rules can serve as a guide to help you ensure that your baby is properly fed:  Newborns (babies 4 weeks of age or younger) may breastfeed every 1-3 hours.  Newborns should not go longer than 3 hours during the day or 5 hours during the night without breastfeeding.  You should breastfeed your baby a minimum of 8 times in a 24-hour period until you begin to introduce solid foods to your baby at around 6 months of age. BREAST MILK PUMPING Pumping and storing breast milk allows you to ensure that your baby is exclusively fed your breast milk, even at times when you are unable to breastfeed. This is especially important if you are  going back to work while you are still breastfeeding or when you are not able to be present during feedings. Your lactation consultant can give you guidelines on how long it is safe to store breast milk.  A breast pump is a machine that allows you to pump milk from your breast into a sterile bottle. The pumped breast milk can then be stored in a refrigerator or freezer. Some breast pumps are operated by hand, while others use electricity. Ask your lactation consultant which type will work best for you. Breast pumps can be purchased, but some hospitals and breastfeeding support groups lease breast pumps on a monthly basis. A lactation consultant can teach you how to hand express breast milk, if you prefer not to use a pump.  CARING FOR YOUR BREASTS WHILE YOU BREASTFEED Nipples can become dry, cracked, and sore while breastfeeding. The following recommendations can help keep your breasts moisturized and healthy:  Avoid using soap on your nipples.   Wear a supportive bra. Although not required, special nursing bras and tank tops are designed to allow access to your breasts for breastfeeding without taking off your entire bra or top. Avoid wearing underwire-style bras or extremely tight bras.  Air dry your nipples for 3-4minutes after each feeding.   Use only cotton bra pads to absorb leaked breast milk. Leaking of breast milk between feedings is normal.   Use lanolin on your nipples after breastfeeding. Lanolin helps to maintain your skin's   normal moisture barrier. If you use pure lanolin, you do not need to wash it off before feeding your baby again. Pure lanolin is not toxic to your baby. You may also hand express a few drops of breast milk and gently massage that milk into your nipples and allow the milk to air dry. In the first few weeks after giving birth, some women experience extremely full breasts (engorgement). Engorgement can make your breasts feel heavy, warm, and tender to the touch.  Engorgement peaks within 3-5 days after you give birth. The following recommendations can help ease engorgement:  Completely empty your breasts while breastfeeding or pumping. You may want to start by applying warm, moist heat (in the shower or with warm water-soaked hand towels) just before feeding or pumping. This increases circulation and helps the milk flow. If your baby does not completely empty your breasts while breastfeeding, pump any extra milk after he or she is finished.  Wear a snug bra (nursing or regular) or tank top for 1-2 days to signal your body to slightly decrease milk production.  Apply ice packs to your breasts, unless this is too uncomfortable for you.  Make sure that your baby is latched on and positioned properly while breastfeeding. If engorgement persists after 48 hours of following these recommendations, contact your health care provider or a lactation consultant. OVERALL HEALTH CARE RECOMMENDATIONS WHILE BREASTFEEDING  Eat healthy foods. Alternate between meals and snacks, eating 3 of each per day. Because what you eat affects your breast milk, some of the foods may make your baby more irritable than usual. Avoid eating these foods if you are sure that they are negatively affecting your baby.  Drink milk, fruit juice, and water to satisfy your thirst (about 10 glasses a day).   Rest often, relax, and continue to take your prenatal vitamins to prevent fatigue, stress, and anemia.  Continue breast self-awareness checks.  Avoid chewing and smoking tobacco.  Avoid alcohol and drug use. Some medicines that may be harmful to your baby can pass through breast milk. It is important to ask your health care provider before taking any medicine, including all over-the-counter and prescription medicine as well as vitamin and herbal supplements. It is possible to become pregnant while breastfeeding. If birth control is desired, ask your health care provider about options that  will be safe for your baby. SEEK MEDICAL CARE IF:   You feel like you want to stop breastfeeding or have become frustrated with breastfeeding.  You have painful breasts or nipples.  Your nipples are cracked or bleeding.  Your breasts are red, tender, or warm.  You have a swollen area on either breast.  You have a fever or chills.  You have nausea or vomiting.  You have drainage other than breast milk from your nipples.  Your breasts do not become full before feedings by the fifth day after you give birth.  You feel sad and depressed.  Your baby is too sleepy to eat well.  Your baby is having trouble sleeping.   Your baby is wetting less than 3 diapers in a 24-hour period.  Your baby has less than 3 stools in a 24-hour period.  Your baby's skin or the white part of his or her eyes becomes yellow.   Your baby is not gaining weight by 5 days of age. SEEK IMMEDIATE MEDICAL CARE IF:   Your baby is overly tired (lethargic) and does not want to wake up and feed.  Your baby   develops an unexplained fever. Document Released: 04/06/2005 Document Revised: 04/11/2013 Document Reviewed: 09/28/2012 [employer] Patient Information 2015 [employer]. This information is not intended to replace advice given to you by your health care provider. Make sure you discuss any questions you have with your health care provider.  

## 2014-09-10 ENCOUNTER — Ambulatory Visit (INDEPENDENT_AMBULATORY_CARE_PROVIDER_SITE_OTHER): Payer: PRIVATE HEALTH INSURANCE | Admitting: Obstetrics and Gynecology

## 2014-09-10 ENCOUNTER — Encounter: Payer: Self-pay | Admitting: Obstetrics and Gynecology

## 2014-09-10 VITALS — BP 112/71 | HR 108 | Wt 223.0 lb

## 2014-09-10 DIAGNOSIS — F413 Other mixed anxiety disorders: Secondary | ICD-10-CM

## 2014-09-10 DIAGNOSIS — Z3493 Encounter for supervision of normal pregnancy, unspecified, third trimester: Secondary | ICD-10-CM

## 2014-09-10 NOTE — Progress Notes (Signed)
Patient is doing well and is without complaints. FM/PTL precautions reviewed. Growth ultrasound ordered for size less than dates. Cultures next visit

## 2014-09-14 ENCOUNTER — Other Ambulatory Visit: Payer: Self-pay | Admitting: Obstetrics and Gynecology

## 2014-09-14 ENCOUNTER — Ambulatory Visit (HOSPITAL_COMMUNITY)
Admission: RE | Admit: 2014-09-14 | Discharge: 2014-09-14 | Disposition: A | Discharge status: Home / Self Care | Payer: PRIVATE HEALTH INSURANCE | Source: Ambulatory Visit | Attending: Obstetrics and Gynecology | Admitting: Obstetrics and Gynecology

## 2014-09-14 DIAGNOSIS — Z3493 Encounter for supervision of normal pregnancy, unspecified, third trimester: Secondary | ICD-10-CM

## 2014-09-18 ENCOUNTER — Encounter: Payer: Self-pay | Admitting: Family Medicine

## 2014-09-18 ENCOUNTER — Ambulatory Visit (INDEPENDENT_AMBULATORY_CARE_PROVIDER_SITE_OTHER): Payer: PRIVATE HEALTH INSURANCE | Admitting: Family Medicine

## 2014-09-18 VITALS — BP 111/72 | HR 105 | Wt 219.6 lb

## 2014-09-18 DIAGNOSIS — Z3493 Encounter for supervision of normal pregnancy, unspecified, third trimester: Secondary | ICD-10-CM

## 2014-09-18 DIAGNOSIS — O4103X1 Oligohydramnios, third trimester, fetus 1: Secondary | ICD-10-CM | POA: Diagnosis not present

## 2014-09-18 DIAGNOSIS — O4100X0 Oligohydramnios, unspecified trimester, not applicable or unspecified: Secondary | ICD-10-CM | POA: Insufficient documentation

## 2014-09-18 DIAGNOSIS — Z36 Encounter for antenatal screening of mother: Secondary | ICD-10-CM | POA: Diagnosis not present

## 2014-09-18 DIAGNOSIS — Z3403 Encounter for supervision of normal first pregnancy, third trimester: Secondary | ICD-10-CM

## 2014-09-18 DIAGNOSIS — O368131 Decreased fetal movements, third trimester, fetus 1: Secondary | ICD-10-CM

## 2014-09-18 LAB — OB RESULTS CONSOLE GBS: GBS: NEGATIVE

## 2014-09-18 LAB — OB RESULTS CONSOLE GC/CHLAMYDIA
Chlamydia: NEGATIVE
Gonorrhea: NEGATIVE

## 2014-09-18 NOTE — Progress Notes (Signed)
Ultrasound recommended twice weekly testing.

## 2014-09-18 NOTE — Progress Notes (Signed)
Subjective:   Jordan LikesSamantha R Quam is a 29 y.o. G1P0000 8032w3d being seen today for her obstetrical visit.  Patient reports no complaints.  Denies contractions, vaginal bleeding or leaking of fluid.  Reports good fetal movement.  The following portions of the patient's history were reviewed and updated as appropriate: allergies, current medications, past family history, past medical history, past social history, past surgical history and problem list.   Objective:  BP 111/72 mmHg  Pulse 105  Wt 219 lb 9.6 oz (99.61 kg)  LMP 01/06/2014  FHT: Fetal Heart Rate (bpm): 144  Uterine Size: Fundal Height: 33 cm  Fetal Movement: Movement: (!) Decreased  Presentation: Presentation: Vertex  U/s reviewed.  Shows normally grown fetus and low AFI at 6. Recommends 2x/wk testing. NST reviewed and reactive.   Abdomen:  soft, gravid, appropriate for gestational age,non-tender  Cervix: Closed,50%,-2, medium,posterior    Urine Pr/gluc-neg/neg  Assessment and Plan:   Pregnancy:  G1P0000 at 7232w3d  1. Supervision of normal pregnancy, third trimester  - Culture, beta strep (group b only) - GC/Chlamydia Probe Amp  2. Oligohydramnios antepartum, third trimester, fetus 1 NST and 2x/wk testing. Fetal movement precautions reviewed.  Follow up in 1 week for OB visit and NST F/u in 3 days for NST and AFI.  Reva Boresanya S Rambo Sarafian, MD

## 2014-09-18 NOTE — Patient Instructions (Signed)
Third Trimester of Pregnancy The third trimester is from week 29 through week 42, months 7 through 9. The third trimester is a time when the fetus is growing rapidly. At the end of the ninth month, the fetus is about 20 inches in length and weighs 6-10 pounds.  BODY CHANGES Your body goes through many changes during pregnancy. The changes vary from woman to woman.   Your weight will continue to increase. You can expect to gain 25-35 pounds (11-16 kg) by the end of the pregnancy.  You may begin to get stretch marks on your hips, abdomen, and breasts.  You may urinate more often because the fetus is moving lower into your pelvis and pressing on your bladder.  You may develop or continue to have heartburn as a result of your pregnancy.  You may develop constipation because certain hormones are causing the muscles that push waste through your intestines to slow down.  You may develop hemorrhoids or swollen, bulging veins (varicose veins).  You may have pelvic pain because of the weight gain and pregnancy hormones relaxing your joints between the bones in your pelvis. Backaches may result from overexertion of the muscles supporting your posture.  You may have changes in your hair. These can include thickening of your hair, rapid growth, and changes in texture. Some women also have hair loss during or after pregnancy, or hair that feels dry or thin. Your hair will most likely return to normal after your baby is born.  Your breasts will continue to grow and be tender. A yellow discharge may leak from your breasts called colostrum.  Your belly button may stick out.  You may feel short of breath because of your expanding uterus.  You may notice the fetus "dropping," or moving lower in your abdomen.  You may have a bloody mucus discharge. This usually occurs a few days to a week before labor begins.  Your cervix becomes thin and soft (effaced) near your due date. WHAT TO EXPECT AT YOUR  PRENATAL EXAMS  You will have prenatal exams every 2 weeks until week 36. Then, you will have weekly prenatal exams. During a routine prenatal visit:  You will be weighed to make sure you and the fetus are growing normally.  Your blood pressure is taken.  Your abdomen will be measured to track your baby's growth.  The fetal heartbeat will be listened to.  Any test results from the previous visit will be discussed.  You may have a cervical check near your due date to see if you have effaced. At around 36 weeks, your caregiver will check your cervix. At the same time, your caregiver will also perform a test on the secretions of the vaginal tissue. This test is to determine if a type of bacteria, Group B streptococcus, is present. Your caregiver will explain this further. Your caregiver may ask you:  What your birth plan is.  How you are feeling.  If you are feeling the baby move.  If you have had any abnormal symptoms, such as leaking fluid, bleeding, severe headaches, or abdominal cramping.  If you have any questions. Other tests or screenings that may be performed during your third trimester include:  Blood tests that check for low iron levels (anemia).  Fetal testing to check the health, activity level, and growth of the fetus. Testing is done if you have certain medical conditions or if there are problems during the pregnancy. FALSE LABOR You may feel small, irregular contractions that   eventually go away. These are called Braxton Hicks contractions, or false labor. Contractions may last for hours, days, or even weeks before true labor sets in. If contractions come at regular intervals, intensify, or become painful, it is best to be seen by your caregiver.  SIGNS OF LABOR   Menstrual-like cramps.  Contractions that are 5 minutes apart or less.  Contractions that start on the top of the uterus and spread down to the lower abdomen and back.  A sense of increased pelvic  pressure or back pain.  A watery or bloody mucus discharge that comes from the vagina. If you have any of these signs before the 37th week of pregnancy, call your caregiver right away. You need to go to the hospital to get checked immediately. HOME CARE INSTRUCTIONS   Avoid all smoking, herbs, alcohol, and unprescribed drugs. These chemicals affect the formation and growth of the baby.  Follow your caregiver's instructions regarding medicine use. There are medicines that are either safe or unsafe to take during pregnancy.  Exercise only as directed by your caregiver. Experiencing uterine cramps is a good sign to stop exercising.  Continue to eat regular, healthy meals.  Wear a good support bra for breast tenderness.  Do not use hot tubs, steam rooms, or saunas.  Wear your seat belt at all times when driving.  Avoid raw meat, uncooked cheese, cat litter boxes, and soil used by cats. These carry germs that can cause birth defects in the baby.  Take your prenatal vitamins.  Try taking a stool softener (if your caregiver approves) if you develop constipation. Eat more high-fiber foods, such as fresh vegetables or fruit and whole grains. Drink plenty of fluids to keep your urine clear or pale yellow.  Take warm sitz baths to soothe any pain or discomfort caused by hemorrhoids. Use hemorrhoid cream if your caregiver approves.  If you develop varicose veins, wear support hose. Elevate your feet for 15 minutes, 3-4 times a day. Limit salt in your diet.  Avoid heavy lifting, wear low heal shoes, and practice good posture.  Rest a lot with your legs elevated if you have leg cramps or low back pain.  Visit your dentist if you have not gone during your pregnancy. Use a soft toothbrush to brush your teeth and be gentle when you floss.  A sexual relationship may be continued unless your caregiver directs you otherwise.  Do not travel far distances unless it is absolutely necessary and only  with the approval of your caregiver.  Take prenatal classes to understand, practice, and ask questions about the labor and delivery.  Make a trial run to the hospital.  Pack your hospital bag.  Prepare the baby's nursery.  Continue to go to all your prenatal visits as directed by your caregiver. SEEK MEDICAL CARE IF:  You are unsure if you are in labor or if your water has broken.  You have dizziness.  You have mild pelvic cramps, pelvic pressure, or nagging pain in your abdominal area.  You have persistent nausea, vomiting, or diarrhea.  You have a bad smelling vaginal discharge.  You have pain with urination. SEEK IMMEDIATE MEDICAL CARE IF:   You have a fever.  You are leaking fluid from your vagina.  You have spotting or bleeding from your vagina.  You have severe abdominal cramping or pain.  You have rapid weight loss or gain.  You have shortness of breath with chest pain.  You notice sudden or extreme swelling   of your face, hands, ankles, feet, or legs.  You have not felt your baby move in over an hour.  You have severe headaches that do not go away with medicine.  You have vision changes. Document Released: 03/31/2001 Document Revised: 04/11/2013 Document Reviewed: 06/07/2012 [employer] Patient Information 2015 [employer]. This information is not intended to replace advice given to you by your health care provider. Make sure you discuss any questions you have with your health care provider.  Breastfeeding Deciding to breastfeed is one of the best choices you can make for you and your baby. A change in hormones during pregnancy causes your breast tissue to grow and increases the number and size of your milk ducts. These hormones also allow proteins, sugars, and fats from your blood supply to make breast milk in your milk-producing glands. Hormones prevent breast milk from being released before your baby is born as well as prompt milk flow after birth. Once  breastfeeding has begun, thoughts of your baby, as well as his or her sucking or crying, can stimulate the release of milk from your milk-producing glands.  BENEFITS OF BREASTFEEDING For Your Baby  Your first milk (colostrum) helps your baby's digestive system function better.   There are antibodies in your milk that help your baby fight off infections.   Your baby has a lower incidence of asthma, allergies, and sudden infant death syndrome.   The nutrients in breast milk are better for your baby than infant formulas and are designed uniquely for your baby's needs.   Breast milk improves your baby's brain development.   Your baby is less likely to develop other conditions, such as childhood obesity, asthma, or type 2 diabetes mellitus.  For You   Breastfeeding helps to create a very special bond between you and your baby.   Breastfeeding is convenient. Breast milk is always available at the correct temperature and costs nothing.   Breastfeeding helps to burn calories and helps you lose the weight gained during pregnancy.   Breastfeeding makes your uterus contract to its prepregnancy size faster and slows bleeding (lochia) after you give birth.   Breastfeeding helps to lower your risk of developing type 2 diabetes mellitus, osteoporosis, and breast or ovarian cancer later in life. SIGNS THAT YOUR BABY IS HUNGRY Early Signs of Hunger  Increased alertness or activity.  Stretching.  Movement of the head from side to side.  Movement of the head and opening of the mouth when the corner of the mouth or cheek is stroked (rooting).  Increased sucking sounds, smacking lips, cooing, sighing, or squeaking.  Hand-to-mouth movements.  Increased sucking of fingers or hands. Late Signs of Hunger  Fussing.  Intermittent crying. Extreme Signs of Hunger Signs of extreme hunger will require calming and consoling before your baby will be able to breastfeed successfully. Do not  wait for the following signs of extreme hunger to occur before you initiate breastfeeding:   Restlessness.  A loud, strong cry.   Screaming. BREASTFEEDING BASICS Breastfeeding Initiation  Find a comfortable place to sit or lie down, with your neck and back well supported.  Place a pillow or rolled up blanket under your baby to bring him or her to the level of your breast (if you are seated). Nursing pillows are specially designed to help support your arms and your baby while you breastfeed.  Make sure that your baby's abdomen is facing your abdomen.   Gently massage your breast. With your fingertips, massage from your chest   wall toward your nipple in a circular motion. This encourages milk flow. You may need to continue this action during the feeding if your milk flows slowly.  Support your breast with 4 fingers underneath and your thumb above your nipple. Make sure your fingers are well away from your nipple and your baby's mouth.   Stroke your baby's lips gently with your finger or nipple.   When your baby's mouth is open wide enough, quickly bring your baby to your breast, placing your entire nipple and as much of the colored area around your nipple (areola) as possible into your baby's mouth.   More areola should be visible above your baby's upper lip than below the lower lip.   Your baby's tongue should be between his or her lower gum and your breast.   Ensure that your baby's mouth is correctly positioned around your nipple (latched). Your baby's lips should create a seal on your breast and be turned out (everted).  It is common for your baby to suck about 2-3 minutes in order to start the flow of breast milk. Latching Teaching your baby how to latch on to your breast properly is very important. An improper latch can cause nipple pain and decreased milk supply for you and poor weight gain in your baby. Also, if your baby is not latched onto your nipple properly, he or she  may swallow some air during feeding. This can make your baby fussy. Burping your baby when you switch breasts during the feeding can help to get rid of the air. However, teaching your baby to latch on properly is still the best way to prevent fussiness from swallowing air while breastfeeding. Signs that your baby has successfully latched on to your nipple:    Silent tugging or silent sucking, without causing you pain.   Swallowing heard between every 3-4 sucks.    Muscle movement above and in front of his or her ears while sucking.  Signs that your baby has not successfully latched on to nipple:   Sucking sounds or smacking sounds from your baby while breastfeeding.  Nipple pain. If you think your baby has not latched on correctly, slip your finger into the corner of your baby's mouth to break the suction and place it between your baby's gums. Attempt breastfeeding initiation again. Signs of Successful Breastfeeding Signs from your baby:   A gradual decrease in the number of sucks or complete cessation of sucking.   Falling asleep.   Relaxation of his or her body.   Retention of a small amount of milk in his or her mouth.   Letting go of your breast by himself or herself. Signs from you:  Breasts that have increased in firmness, weight, and size 1-3 hours after feeding.   Breasts that are softer immediately after breastfeeding.  Increased milk volume, as well as a change in milk consistency and color by the fifth day of breastfeeding.   Nipples that are not sore, cracked, or bleeding. Signs That Your Baby is Getting Enough Milk  Wetting at least 3 diapers in a 24-hour period. The urine should be clear and pale yellow by age 5 days.  At least 3 stools in a 24-hour period by age 5 days. The stool should be soft and yellow.  At least 3 stools in a 24-hour period by age 7 days. The stool should be seedy and yellow.  No loss of weight greater than 10% of birth weight  during the first 3   days of age.  Average weight gain of 4-7 ounces (113-198 g) per week after age 4 days.  Consistent daily weight gain by age 5 days, without weight loss after the age of 2 weeks. After a feeding, your baby may spit up a small amount. This is common. BREASTFEEDING FREQUENCY AND DURATION Frequent feeding will help you make more milk and can prevent sore nipples and breast engorgement. Breastfeed when you feel the need to reduce the fullness of your breasts or when your baby shows signs of hunger. This is called "breastfeeding on demand." Avoid introducing a pacifier to your baby while you are working to establish breastfeeding (the first 4-6 weeks after your baby is born). After this time you may choose to use a pacifier. Research has shown that pacifier use during the first year of a baby's life decreases the risk of sudden infant death syndrome (SIDS). Allow your baby to feed on each breast as long as he or she wants. Breastfeed until your baby is finished feeding. When your baby unlatches or falls asleep while feeding from the first breast, offer the second breast. Because newborns are often sleepy in the first few weeks of life, you may need to awaken your baby to get him or her to feed. Breastfeeding times will vary from baby to baby. However, the following rules can serve as a guide to help you ensure that your baby is properly fed:  Newborns (babies 4 weeks of age or younger) may breastfeed every 1-3 hours.  Newborns should not go longer than 3 hours during the day or 5 hours during the night without breastfeeding.  You should breastfeed your baby a minimum of 8 times in a 24-hour period until you begin to introduce solid foods to your baby at around 6 months of age. BREAST MILK PUMPING Pumping and storing breast milk allows you to ensure that your baby is exclusively fed your breast milk, even at times when you are unable to breastfeed. This is especially important if you are  going back to work while you are still breastfeeding or when you are not able to be present during feedings. Your lactation consultant can give you guidelines on how long it is safe to store breast milk.  A breast pump is a machine that allows you to pump milk from your breast into a sterile bottle. The pumped breast milk can then be stored in a refrigerator or freezer. Some breast pumps are operated by hand, while others use electricity. Ask your lactation consultant which type will work best for you. Breast pumps can be purchased, but some hospitals and breastfeeding support groups lease breast pumps on a monthly basis. A lactation consultant can teach you how to hand express breast milk, if you prefer not to use a pump.  CARING FOR YOUR BREASTS WHILE YOU BREASTFEED Nipples can become dry, cracked, and sore while breastfeeding. The following recommendations can help keep your breasts moisturized and healthy:  Avoid using soap on your nipples.   Wear a supportive bra. Although not required, special nursing bras and tank tops are designed to allow access to your breasts for breastfeeding without taking off your entire bra or top. Avoid wearing underwire-style bras or extremely tight bras.  Air dry your nipples for 3-4minutes after each feeding.   Use only cotton bra pads to absorb leaked breast milk. Leaking of breast milk between feedings is normal.   Use lanolin on your nipples after breastfeeding. Lanolin helps to maintain your skin's   normal moisture barrier. If you use pure lanolin, you do not need to wash it off before feeding your baby again. Pure lanolin is not toxic to your baby. You may also hand express a few drops of breast milk and gently massage that milk into your nipples and allow the milk to air dry. In the first few weeks after giving birth, some women experience extremely full breasts (engorgement). Engorgement can make your breasts feel heavy, warm, and tender to the touch.  Engorgement peaks within 3-5 days after you give birth. The following recommendations can help ease engorgement:  Completely empty your breasts while breastfeeding or pumping. You may want to start by applying warm, moist heat (in the shower or with warm water-soaked hand towels) just before feeding or pumping. This increases circulation and helps the milk flow. If your baby does not completely empty your breasts while breastfeeding, pump any extra milk after he or she is finished.  Wear a snug bra (nursing or regular) or tank top for 1-2 days to signal your body to slightly decrease milk production.  Apply ice packs to your breasts, unless this is too uncomfortable for you.  Make sure that your baby is latched on and positioned properly while breastfeeding. If engorgement persists after 48 hours of following these recommendations, contact your health care provider or a lactation consultant. OVERALL HEALTH CARE RECOMMENDATIONS WHILE BREASTFEEDING  Eat healthy foods. Alternate between meals and snacks, eating 3 of each per day. Because what you eat affects your breast milk, some of the foods may make your baby more irritable than usual. Avoid eating these foods if you are sure that they are negatively affecting your baby.  Drink milk, fruit juice, and water to satisfy your thirst (about 10 glasses a day).   Rest often, relax, and continue to take your prenatal vitamins to prevent fatigue, stress, and anemia.  Continue breast self-awareness checks.  Avoid chewing and smoking tobacco.  Avoid alcohol and drug use. Some medicines that may be harmful to your baby can pass through breast milk. It is important to ask your health care provider before taking any medicine, including all over-the-counter and prescription medicine as well as vitamin and herbal supplements. It is possible to become pregnant while breastfeeding. If birth control is desired, ask your health care provider about options that  will be safe for your baby. SEEK MEDICAL CARE IF:   You feel like you want to stop breastfeeding or have become frustrated with breastfeeding.  You have painful breasts or nipples.  Your nipples are cracked or bleeding.  Your breasts are red, tender, or warm.  You have a swollen area on either breast.  You have a fever or chills.  You have nausea or vomiting.  You have drainage other than breast milk from your nipples.  Your breasts do not become full before feedings by the fifth day after you give birth.  You feel sad and depressed.  Your baby is too sleepy to eat well.  Your baby is having trouble sleeping.   Your baby is wetting less than 3 diapers in a 24-hour period.  Your baby has less than 3 stools in a 24-hour period.  Your baby's skin or the white part of his or her eyes becomes yellow.   Your baby is not gaining weight by 5 days of age. SEEK IMMEDIATE MEDICAL CARE IF:   Your baby is overly tired (lethargic) and does not want to wake up and feed.  Your baby   develops an unexplained fever. Document Released: 04/06/2005 Document Revised: 04/11/2013 Document Reviewed: 09/28/2012 [employer] Patient Information 2015 [employer]. This information is not intended to replace advice given to you by your health care provider. Make sure you discuss any questions you have with your health care provider.  

## 2014-09-19 LAB — GC/CHLAMYDIA PROBE AMP
CT Probe RNA: NEGATIVE
GC Probe RNA: NEGATIVE

## 2014-09-20 LAB — CULTURE, BETA STREP (GROUP B ONLY)

## 2014-09-21 ENCOUNTER — Ambulatory Visit (INDEPENDENT_AMBULATORY_CARE_PROVIDER_SITE_OTHER): Payer: PRIVATE HEALTH INSURANCE | Admitting: *Deleted

## 2014-09-21 ENCOUNTER — Encounter: Payer: Self-pay | Admitting: *Deleted

## 2014-09-21 DIAGNOSIS — O4103X1 Oligohydramnios, third trimester, fetus 1: Secondary | ICD-10-CM | POA: Diagnosis not present

## 2014-09-21 NOTE — Progress Notes (Signed)
Patient is having increased contractions last night and increased mucous discharge.  She had a large amount of mucous with a small amount of blood in it as well.

## 2014-09-24 ENCOUNTER — Encounter (HOSPITAL_COMMUNITY): Payer: Self-pay | Admitting: *Deleted

## 2014-09-24 ENCOUNTER — Inpatient Hospital Stay (HOSPITAL_COMMUNITY)
Admission: AD | Admit: 2014-09-24 | Discharge: 2014-09-24 | Disposition: A | Discharge status: Home / Self Care | Payer: PRIVATE HEALTH INSURANCE | Source: Ambulatory Visit | Attending: Obstetrics & Gynecology | Admitting: Obstetrics & Gynecology

## 2014-09-24 ENCOUNTER — Encounter: Payer: PRIVATE HEALTH INSURANCE | Admitting: Family Medicine

## 2014-09-24 DIAGNOSIS — O4103X1 Oligohydramnios, third trimester, fetus 1: Secondary | ICD-10-CM

## 2014-09-24 DIAGNOSIS — Z3A37 37 weeks gestation of pregnancy: Secondary | ICD-10-CM | POA: Diagnosis not present

## 2014-09-24 DIAGNOSIS — Z3493 Encounter for supervision of normal pregnancy, unspecified, third trimester: Secondary | ICD-10-CM

## 2014-09-24 DIAGNOSIS — O9989 Other specified diseases and conditions complicating pregnancy, childbirth and the puerperium: Secondary | ICD-10-CM | POA: Insufficient documentation

## 2014-09-24 NOTE — MAU Note (Signed)
Contractions became regular this morning, now every 4  Minutes. No bleeding or leaking. Has been followed for low fluids.  Has not been dilated.

## 2014-09-24 NOTE — Discharge Instructions (Signed)
Braxton Hicks Contractions °Contractions of the uterus can occur throughout pregnancy. Contractions are not always a sign that you are in labor.  °WHAT ARE BRAXTON HICKS CONTRACTIONS?  °Contractions that occur before labor are called Braxton Hicks contractions, or false labor. Toward the end of pregnancy (32-34 weeks), these contractions can develop more often and may become more forceful. This is not true labor because these contractions do not result in opening (dilatation) and thinning of the cervix. They are sometimes difficult to tell apart from true labor because these contractions can be forceful and people have different pain tolerances. You should not feel embarrassed if you go to the hospital with false labor. Sometimes, the only way to tell if you are in true labor is for your health care provider to look for changes in the cervix. °If there are no prenatal problems or other health problems associated with the pregnancy, it is completely safe to be sent home with false labor and await the onset of true labor. °HOW CAN YOU TELL THE DIFFERENCE BETWEEN TRUE AND FALSE LABOR? °False Labor °· The contractions of false labor are usually shorter and not as hard as those of true labor.   °· The contractions are usually irregular.   °· The contractions are often felt in the front of the lower abdomen and in the groin.   °· The contractions may go away when you walk around or change positions while lying down.   °· The contractions get weaker and are shorter lasting as time goes on.   °· The contractions do not usually become progressively stronger, regular, and closer together as with true labor.   °True Labor °· Contractions in true labor last 30-70 seconds, become very regular, usually become more intense, and increase in frequency.   °· The contractions do not go away with walking.   °· The discomfort is usually felt in the top of the uterus and spreads to the lower abdomen and low back.   °· True labor can be  determined by your health care provider with an exam. This will show that the cervix is dilating and getting thinner.   °WHAT TO REMEMBER °· Keep up with your usual exercises and follow other instructions given by your health care provider.   °· Take medicines as directed by your health care provider.   °· Keep your regular prenatal appointments.   °· Eat and drink lightly if you think you are going into labor.   °· If Braxton Hicks contractions are making you uncomfortable:   °¨ Change your position from lying down or resting to walking, or from walking to resting.   °¨ Sit and rest in a tub of warm water.   °¨ Drink 2-3 glasses of water. Dehydration may cause these contractions.   °¨ Do slow and deep breathing several times an hour.   °WHEN SHOULD I SEEK IMMEDIATE MEDICAL CARE? °Seek immediate medical care if: °· Your contractions become stronger, more regular, and closer together.   °· You have fluid leaking or gushing from your vagina.   °· You have a fever.    °· You have vaginal bleeding.   °· You have continuous abdominal pain.   °· You have low back pain that you never had before.   °· You feel your baby's head pushing down and causing pelvic pressure.   °· Your baby is not moving as much as it used to.   °Document Released: 04/06/2005 Document Revised: 04/11/2013 Document Reviewed: 01/16/2013 °[employer]® Patient Information ©2015 [employer]. This information is not intended to replace advice given to you by your health care provider. Make sure you discuss any   questions you have with your health care provider. ° °

## 2014-09-28 ENCOUNTER — Ambulatory Visit (INDEPENDENT_AMBULATORY_CARE_PROVIDER_SITE_OTHER): Payer: PRIVATE HEALTH INSURANCE | Admitting: Obstetrics & Gynecology

## 2014-09-28 ENCOUNTER — Encounter: Payer: Self-pay | Admitting: Obstetrics & Gynecology

## 2014-09-28 VITALS — BP 116/77 | HR 111 | Wt 223.4 lb

## 2014-09-28 DIAGNOSIS — O471 False labor at or after 37 completed weeks of gestation: Secondary | ICD-10-CM | POA: Diagnosis not present

## 2014-09-28 DIAGNOSIS — Z3A38 38 weeks gestation of pregnancy: Secondary | ICD-10-CM | POA: Diagnosis not present

## 2014-09-28 DIAGNOSIS — O4103X0 Oligohydramnios, third trimester, not applicable or unspecified: Secondary | ICD-10-CM

## 2014-09-28 DIAGNOSIS — Z3493 Encounter for supervision of normal pregnancy, unspecified, third trimester: Secondary | ICD-10-CM

## 2014-09-28 NOTE — Progress Notes (Signed)
Patient is having increased contractions, was seen in MAU Monday.

## 2014-09-28 NOTE — Progress Notes (Signed)
Routine visit. Good FM. No problems. Labor precautions reviewed. 

## 2014-10-02 ENCOUNTER — Ambulatory Visit (INDEPENDENT_AMBULATORY_CARE_PROVIDER_SITE_OTHER): Payer: PRIVATE HEALTH INSURANCE | Admitting: *Deleted

## 2014-10-02 VITALS — BP 111/67 | HR 125 | Wt 223.0 lb

## 2014-10-02 DIAGNOSIS — O4103X1 Oligohydramnios, third trimester, fetus 1: Secondary | ICD-10-CM | POA: Diagnosis not present

## 2014-10-02 NOTE — Progress Notes (Signed)
NST reviewed and reactive.  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

## 2014-10-05 ENCOUNTER — Ambulatory Visit (INDEPENDENT_AMBULATORY_CARE_PROVIDER_SITE_OTHER): Payer: PRIVATE HEALTH INSURANCE | Admitting: Obstetrics and Gynecology

## 2014-10-05 ENCOUNTER — Encounter: Payer: Self-pay | Admitting: Obstetrics and Gynecology

## 2014-10-05 VITALS — BP 111/69 | HR 114 | Wt 223.0 lb

## 2014-10-05 DIAGNOSIS — O4103X1 Oligohydramnios, third trimester, fetus 1: Secondary | ICD-10-CM

## 2014-10-05 DIAGNOSIS — Z3493 Encounter for supervision of normal pregnancy, unspecified, third trimester: Secondary | ICD-10-CM

## 2014-10-05 NOTE — Progress Notes (Signed)
Bedside US shows AFI 8.27cm.

## 2014-10-05 NOTE — Progress Notes (Signed)
Subjective:  Loretta Burton is a 29 y.o. G1P0000 at [redacted]w[redacted]d being seen today for ongoing prenatal care.  Patient reports no complaints.  Contractions: Irregular.  Vag. Bleeding: None. Movement: Present. Denies leaking of fluid.   The following portions of the patient's history were reviewed and updated as appropriate: allergies, current medications, past family history, past medical history, past social history, past surgical history and problem list.   Objective:   Filed Vitals:   10/05/14 0933  BP: 111/69  Pulse: 114  Weight: 223 lb (101.152 kg)    Fetal Status: Fetal Heart Rate (bpm): NST-R   Movement: Present     General:  Alert, oriented and cooperative. Patient is in no acute distress.  Skin: Skin is warm and dry. No rash noted.   Cardiovascular: Normal heart rate noted  Respiratory: Effort and breath sounds normal, no problems with respiration noted  Abdomen: Soft, gravid, appropriate for gestational age. Pain/Pressure: Present     Vaginal: Vag. Bleeding: None.    Vag D/C Character: Thin  Cervix: Not evaluated  Extremities: Normal range of motion.  Edema: Trace  Mental Status: Normal mood and affect. Normal behavior. Normal judgment and thought content.   Urinalysis:      Assessment and Plan:  Pregnancy: G1P0000 at [redacted]w[redacted]d  1. Oligohydramnios antepartum, third trimester, fetus 1 Repeat AFI today 8 NST reviewed and reactive  2. Supervision of normal pregnancy, third trimester    Term labor symptoms and general obstetric precautions including but not limited to vaginal bleeding, contractions, leaking of fluid and fetal movement were reviewed in detail with the patient.  Please refer to After Visit Summary for other counseling recommendations.   Return in about 1 week (around 10/12/2014).   Catalina Antigua, MD

## 2014-10-09 ENCOUNTER — Ambulatory Visit (INDEPENDENT_AMBULATORY_CARE_PROVIDER_SITE_OTHER): Payer: PRIVATE HEALTH INSURANCE | Admitting: *Deleted

## 2014-10-09 VITALS — BP 130/69 | HR 94 | Wt 224.0 lb

## 2014-10-09 DIAGNOSIS — O4103X1 Oligohydramnios, third trimester, fetus 1: Secondary | ICD-10-CM | POA: Diagnosis not present

## 2014-10-09 NOTE — Progress Notes (Signed)
NST performed today was reviewed and was found to be reactive.  Continue recommended antenatal testing and prenatal care.  Jaynie Collins, MD, FACOG Attending Obstetrician & Gynecologist Center for [employer], [facility]

## 2014-10-10 ENCOUNTER — Inpatient Hospital Stay (HOSPITAL_COMMUNITY)
Admission: AD | Admit: 2014-10-10 | Discharge: 2014-10-10 | Disposition: A | Discharge status: Home / Self Care | Payer: PRIVATE HEALTH INSURANCE | Source: Ambulatory Visit | Attending: Family Medicine | Admitting: Family Medicine

## 2014-10-10 ENCOUNTER — Other Ambulatory Visit: Payer: PRIVATE HEALTH INSURANCE

## 2014-10-10 ENCOUNTER — Encounter (HOSPITAL_COMMUNITY): Payer: Self-pay

## 2014-10-10 DIAGNOSIS — O99613 Diseases of the digestive system complicating pregnancy, third trimester: Secondary | ICD-10-CM

## 2014-10-10 DIAGNOSIS — Z3493 Encounter for supervision of normal pregnancy, unspecified, third trimester: Secondary | ICD-10-CM

## 2014-10-10 DIAGNOSIS — O212 Late vomiting of pregnancy: Secondary | ICD-10-CM | POA: Insufficient documentation

## 2014-10-10 DIAGNOSIS — Z3A39 39 weeks gestation of pregnancy: Secondary | ICD-10-CM | POA: Insufficient documentation

## 2014-10-10 DIAGNOSIS — R1013 Epigastric pain: Secondary | ICD-10-CM | POA: Diagnosis not present

## 2014-10-10 DIAGNOSIS — O4103X1 Oligohydramnios, third trimester, fetus 1: Secondary | ICD-10-CM

## 2014-10-10 DIAGNOSIS — K219 Gastro-esophageal reflux disease without esophagitis: Secondary | ICD-10-CM | POA: Diagnosis not present

## 2014-10-10 DIAGNOSIS — R109 Unspecified abdominal pain: Secondary | ICD-10-CM | POA: Diagnosis present

## 2014-10-10 MED ORDER — GI COCKTAIL ~~LOC~~
30.0000 mL | Freq: Once | ORAL | Status: AC
Start: 2014-10-10 — End: 2014-10-10
  Administered 2014-10-10: 30 mL via ORAL
  Filled 2014-10-10: qty 30

## 2014-10-10 NOTE — Discharge Instructions (Signed)
Abdominal Pain During Pregnancy Belly (abdominal) pain is common during pregnancy. Most of the time, it is not a serious problem. Other times, it can be a sign that something is wrong with the pregnancy. Always tell your doctor if you have belly pain. HOME CARE Monitor your belly pain for any changes. The following actions may help you feel better:  Do not have sex (intercourse) or put anything in your vagina until you feel better.  Rest until your pain stops.  Drink clear fluids if you feel sick to your stomach (nauseous). Do not eat solid food until you feel better.  Only take medicine as told by your doctor.  Keep all doctor visits as told. GET HELP RIGHT AWAY IF:   You are bleeding, leaking fluid, or pieces of tissue come out of your vagina.  You have more pain or cramping.  You keep throwing up (vomiting).  You have pain when you pee (urinate) or have blood in your pee.  You have a fever.  You do not feel your baby moving as much.  You feel very weak or feel like passing out.  You have trouble breathing, with or without belly pain.  You have a very bad headache and belly pain.  You have fluid leaking from your vagina and belly pain.  You keep having watery poop (diarrhea).  Your belly pain does not go away after resting, or the pain gets worse. MAKE SURE YOU:   Understand these instructions.  Will watch your condition.  Will get help right away if you are not doing well or get worse. Document Released: 03/25/2009 Document Revised: 12/07/2012 Document Reviewed: 11/03/2012 [facility] Patient Information 2015 [place], [place]. This information is not intended to replace advice given to you by your health care provider. Make sure you discuss any questions you have with your health care provider. Heartburn During Pregnancy  Heartburn is a burning sensation in the chest caused by stomach acid backing up into the esophagus. Heartburn is common in pregnancy because a  certain hormone (progesterone) is released when a woman is pregnant. The progesterone hormone may relax the valve that separates the esophagus from the stomach. This allows acid to go up into the esophagus, causing heartburn. Heartburn may also happen in pregnancy because the enlarging uterus pushes up on the stomach, which pushes more acid into the esophagus. This is especially true in the later stages of pregnancy. Heartburn problems usually go away after giving birth. CAUSES  Heartburn is caused by stomach acid backing up into the esophagus. During pregnancy, this may result from various things, including:   The progesterone hormone.  Changing hormone levels.  The growing uterus pushing stomach acid upward.  Large meals.  Certain foods and drinks.  Exercise.  Increased acid production. SIGNS AND SYMPTOMS   Burning pain in the chest or lower throat.  Bitter taste in the mouth.  Coughing. DIAGNOSIS  Your health care provider will typically diagnose heartburn by taking a careful history of your concern. Blood tests may be done to check for a certain type of bacteria that is associated with heartburn. Sometimes, heartburn is diagnosed by prescribing a heartburn medicine to see if the symptoms improve. In some cases, a procedure called an endoscopy may be done. In this procedure, a tube with a light and a camera on the end (endoscope) is used to examine the esophagus and the stomach. TREATMENT  Treatment will vary depending on the severity of your symptoms. Your health care provider may recommend:  Over-the-counter medicines (antacids, acid reducers) for mild heartburn.  Prescription medicines to decrease stomach acid or to protect your stomach lining.  Certain changes in your diet.  Elevating the head of your bed by putting blocks under the legs. This helps prevent stomach acid from backing up into the esophagus when you are lying down. HOME CARE INSTRUCTIONS   Only take  over-the-counter or prescription medicines as directed by your health care provider.  Raise the head of your bed by putting blocks under the legs if instructed to do so by your health care provider. Sleeping with more pillows is not effective because it only changes the position of your head.  Do not exercise right after eating.  Avoid eating 2-3 hours before bed. Do not lie down right after eating.  Eat small meals throughout the day instead of three large meals.  Identify foods and beverages that make your symptoms worse and avoid them. Foods you may want to avoid include:  Peppers.  Chocolate.  High-fat foods, including fried foods.  Spicy foods.  Garlic and onions.  Citrus fruits, including oranges, grapefruit, lemons, and limes.  Food containing tomatoes or tomato products.  Mint.  Carbonated and caffeinated drinks.  Vinegar. SEEK MEDICAL CARE IF:  You have abdominal pain of any kind.  You feel burning in your upper abdomen or chest, especially after eating or lying down.  You have nausea and vomiting.  Your stomach feels upset after you eat. SEEK IMMEDIATE MEDICAL CARE IF:   You have severe chest pain that goes down your arm or into your jaw or neck.  You feel sweaty, dizzy, or light-headed.  You become short of breath.  You vomit blood.  You have difficulty or pain with swallowing.  You have bloody or black, tarry stools.  You have episodes of heartburn more than 3 times a week, for more than 2 weeks. MAKE SURE YOU:  Understand these instructions.  Will watch your condition.  Will get help right away if you are not doing well or get worse. Document Released: 04/03/2000 Document Revised: 04/11/2013 Document Reviewed: 11/23/2012 [facility] Patient Information 2015 [place], [place]. This information is not intended to replace advice given to you by your health care provider. Make sure you discuss any questions you have with your health care  provider.

## 2014-10-10 NOTE — MAU Note (Signed)
PT  SAYS SHE STARTED  HURTING IN HER BACK ON Monday  -  EASES OFF  THEN PAIN COMES  BACK.    TOOK  ZANTAC AT 0530-  AND NOW  PAIN LESS.   HAD   VOMITING  AT 0400 -  ATE LAST  AY 8PM.    DENIES  HSV AND MRSA.  GBS- NEG     NO VE IN   OFFICE

## 2014-10-10 NOTE — MAU Provider Note (Signed)
History     CSN: [id number]  Arrival date and time: 10/10/14 6010   None     No chief complaint on file.  HPI Patient is 29 y.o. G1P0000 [redacted]w[redacted]d here with complaints of abdominal pain for the last three days. Describes the pain as mid-upper abdomen with radiation to her right side and her back. It is worse when she lays down. Comes and goes without warning. Will ease off for a few hours and then come back. Has some associated nausea and vomiting. Has been able to eat and drink some. Pain is not necessarily worse after eating. No fevers or chills. No constipation or diarrhea. She notes the pain was a little better after taking ranitidine.  +FM, denies LOF, VB, contractions, vaginal discharge.    OB History    Gravida Para Term Preterm AB TAB SAB Ectopic Multiple Living   1 0 0 0 0 0 0 0 0 0       Past Medical History  Diagnosis Date  . Depression   . Panic attack   . Allergy     seasonal    Past Surgical History  Procedure Laterality Date  . Wisdom tooth extraction      x4    Family History  Problem Relation Age of Onset  . Diabetes Mother   . Hypertension Mother   . Depression Mother   . Diabetes Maternal Grandmother   . Hypertension Maternal Grandmother   . Heart disease Maternal Grandmother   . Cancer Maternal Grandmother     skin  . Diabetes Paternal Grandmother   . Hypertension Paternal Grandmother   . Heart disease Paternal Grandmother   . Diabetes Paternal Grandfather   . Hypertension Paternal Grandfather   . Heart disease Paternal Grandfather     History  Substance Use Topics  . Smoking status: Never Smoker   . Smokeless tobacco: Never Used  . Alcohol Use: No    Allergies:  Allergies  Allergen Reactions  . Ciprofloxacin Hives and Nausea And Vomiting    Prescriptions prior to admission  Medication Sig Dispense Refill Last Dose  . bismuth subsalicylate (PEPTO BISMOL) 262 MG/15ML suspension Take 30 mLs by mouth every 6 (six) hours as needed.    10/09/2014 at Unknown time  . calcium carbonate (TUMS - DOSED IN MG ELEMENTAL CALCIUM) 500 MG chewable tablet Chew 1 tablet by mouth daily.   10/09/2014 at Unknown time  . cetirizine (ZYRTEC) 10 MG tablet Take 10 mg by mouth daily.   10/09/2014 at Unknown time  . docusate sodium (COLACE) 100 MG capsule Take 100 mg by mouth daily.   10/09/2014 at Unknown time  . ondansetron (ZOFRAN ODT) 4 MG disintegrating tablet Take 1 tablet (4 mg total) by mouth every 6 (six) hours as needed for nausea. 42 tablet 3 Past Month at Unknown time  . ranitidine (ZANTAC) 150 MG tablet Take 150 mg by mouth 2 (two) times daily.   10/10/2014 at 0500    Review of Systems  Constitutional: Negative for fever and chills.  Eyes: Negative for blurred vision and double vision.  Respiratory: Negative for shortness of breath.   Cardiovascular: Negative for chest pain and leg swelling.  Gastrointestinal: Positive for nausea and abdominal pain. Negative for heartburn, vomiting, diarrhea and constipation.  Genitourinary: Negative for dysuria and urgency.       No contractions, vaginal bleeding, itching or discharge  Skin: Negative for itching and rash.  Neurological: Negative for dizziness and headaches.   Physical Exam  Last menstrual period 01/06/2014.  Physical Exam  Constitutional: She is oriented to person, place, and time. She appears well-developed and well-nourished.  HENT:  Head: Normocephalic and atraumatic.  Eyes: Conjunctivae are normal.  Neck: Normal range of motion.  Cardiovascular: Normal rate, regular rhythm, normal heart sounds and intact distal pulses.   No murmur heard. Respiratory: Effort normal and breath sounds normal. No respiratory distress. She has no wheezes.  GI: Soft. Bowel sounds are normal.  Mild tenderness to palpation in epigastrum, RUQ, otherwise non tender  Musculoskeletal: Normal range of motion. She exhibits no edema.  Neurological: She is alert and oriented to person, place, and time.   Skin: Skin is warm and dry.  Psychiatric: She has a normal mood and affect.    MAU Course  Procedures  MDM Patient is a 29 y.o. G1P0000 at [redacted]w[redacted]d presenting with three days of intermittent epigastric abdominal pain with N/V. Physical exam is reassuring. FHT Cat I and no contractions noted. Patient is given GI cocktail and notes significant improvement in symptoms.   Assessment and Plan  Patient is 29 y.o. G1P0000 [redacted]w[redacted]d reporting epigastric abdominal pain, N/V likely secondary to GERD. Relieved with GI cocktail -discharge to home -patient to take ranitidine BID - fetal kick counts reinforced - preterm labor precautions  Patient history, exam, assessment and plan discussed with Dr. Burnett Kanaris T Delanna Ahmadi 10/10/2014, 6:54 AM   OB fellow attestation:  I have seen and examined this patient; I agree with above documentation in the resident's note.   REHANNA OLOUGHLIN is a 29 y.o. G1P0000 reporting midepigastric pain x 3 days, back pain.  On further questioning I do not think they two symptoms are linked. PAtient regularly has back pain and it is at it's baseline.  Has had midepigastric pain x 3 days, has had previously but not this long.  Took ranitidine once and thinks it may have helped.. +FM, denies LOF, VB, contractions, vaginal discharge.  PE: BP 104/55 mmHg  Pulse 88  Resp 16  LMP 01/06/2014 Gen: calm comfortable, NAD Resp: normal effort, no distress Abd: gravid, soft, mild midepigastric TTP  ROS, labs, PMH reviewed NST reactive, no contractions  Plan: Dyspepsia with resolution of symptoms with GI cocktail - fetal kick counts reinforced, labor precautions - dyspepsia: GI cocktail, rx ranitidine BID - continue routine follow up in OB clinic  Lakyra Tippins ROCIO, MD 1:30 PM

## 2014-10-10 NOTE — MAU Note (Signed)
Urine in lab 

## 2014-10-12 ENCOUNTER — Inpatient Hospital Stay (HOSPITAL_COMMUNITY)
Admission: AD | Admit: 2014-10-12 | Discharge: 2014-10-16 | DRG: 775 | Disposition: A | Discharge status: Home / Self Care | Payer: PRIVATE HEALTH INSURANCE | Source: Ambulatory Visit | Attending: Obstetrics and Gynecology | Admitting: Obstetrics and Gynecology

## 2014-10-12 ENCOUNTER — Ambulatory Visit (INDEPENDENT_AMBULATORY_CARE_PROVIDER_SITE_OTHER): Payer: PRIVATE HEALTH INSURANCE | Admitting: Obstetrics & Gynecology

## 2014-10-12 ENCOUNTER — Encounter (HOSPITAL_COMMUNITY): Payer: Self-pay | Admitting: *Deleted

## 2014-10-12 VITALS — BP 141/84 | HR 109 | Wt 227.0 lb

## 2014-10-12 DIAGNOSIS — F329 Major depressive disorder, single episode, unspecified: Secondary | ICD-10-CM | POA: Diagnosis present

## 2014-10-12 DIAGNOSIS — O4103X0 Oligohydramnios, third trimester, not applicable or unspecified: Secondary | ICD-10-CM

## 2014-10-12 DIAGNOSIS — Z881 Allergy status to other antibiotic agents status: Secondary | ICD-10-CM

## 2014-10-12 DIAGNOSIS — O99354 Diseases of the nervous system complicating childbirth: Secondary | ICD-10-CM | POA: Diagnosis present

## 2014-10-12 DIAGNOSIS — Z79899 Other long term (current) drug therapy: Secondary | ICD-10-CM | POA: Diagnosis not present

## 2014-10-12 DIAGNOSIS — F410 Panic disorder [episodic paroxysmal anxiety] without agoraphobia: Secondary | ICD-10-CM | POA: Diagnosis present

## 2014-10-12 DIAGNOSIS — O99344 Other mental disorders complicating childbirth: Secondary | ICD-10-CM | POA: Diagnosis present

## 2014-10-12 DIAGNOSIS — Z3403 Encounter for supervision of normal first pregnancy, third trimester: Secondary | ICD-10-CM

## 2014-10-12 DIAGNOSIS — G4700 Insomnia, unspecified: Secondary | ICD-10-CM | POA: Diagnosis present

## 2014-10-12 DIAGNOSIS — Z3A39 39 weeks gestation of pregnancy: Secondary | ICD-10-CM | POA: Diagnosis present

## 2014-10-12 DIAGNOSIS — O9952 Diseases of the respiratory system complicating childbirth: Secondary | ICD-10-CM | POA: Diagnosis present

## 2014-10-12 DIAGNOSIS — J302 Other seasonal allergic rhinitis: Secondary | ICD-10-CM | POA: Diagnosis present

## 2014-10-12 DIAGNOSIS — Z3493 Encounter for supervision of normal pregnancy, unspecified, third trimester: Secondary | ICD-10-CM

## 2014-10-12 HISTORY — DX: Insomnia, unspecified: G47.00

## 2014-10-12 LAB — COMPREHENSIVE METABOLIC PANEL
ALT: 47 U/L (ref 14–54)
AST: 26 U/L (ref 15–41)
Albumin: 3.3 g/dL — ABNORMAL LOW (ref 3.5–5.0)
Alkaline Phosphatase: 176 U/L — ABNORMAL HIGH (ref 38–126)
Anion gap: 10 (ref 5–15)
BUN: 8 mg/dL (ref 6–20)
CALCIUM: 9.7 mg/dL (ref 8.9–10.3)
CO2: 20 mmol/L — ABNORMAL LOW (ref 22–32)
Chloride: 107 mmol/L (ref 101–111)
Creatinine, Ser: 0.51 mg/dL (ref 0.44–1.00)
GFR calc non Af Amer: >60 mL/min (ref >60)
Glucose, Bld: 121 mg/dL — ABNORMAL HIGH (ref 65–99)
POTASSIUM: 3.4 mmol/L — AB (ref 3.5–5.1)
SODIUM: 137 mmol/L (ref 135–145)
TOTAL PROTEIN: 6.6 g/dL (ref 6.5–8.1)
Total Bilirubin: 0.4 mg/dL (ref 0.3–1.2)

## 2014-10-12 LAB — CBC
HCT: 34.8 % — ABNORMAL LOW (ref 36.0–46.0)
Hemoglobin: 12 g/dL (ref 12.0–15.0)
MCH: 30.4 pg (ref 26.0–34.0)
MCHC: 34.5 g/dL (ref 30.0–36.0)
MCV: 88.1 fL (ref 78.0–100.0)
PLATELETS: 197 10*3/uL (ref 150–400)
RBC: 3.95 MIL/uL (ref 3.87–5.11)
RDW: 13.3 % (ref 11.5–15.5)
WBC: 8.9 10*3/uL (ref 4.0–10.5)

## 2014-10-12 LAB — TYPE AND SCREEN
ABO/RH(D): A POS
ANTIBODY SCREEN: NEGATIVE

## 2014-10-12 LAB — ABO/RH: ABO/RH(D): A POS

## 2014-10-12 LAB — PROTEIN / CREATININE RATIO, URINE
CREATININE, URINE: 181 mg/dL
Protein Creatinine Ratio: 0.09 mg/mg{Cre} (ref 0.00–0.15)
TOTAL PROTEIN, URINE: 16 mg/dL

## 2014-10-12 MED ORDER — ACETAMINOPHEN 325 MG PO TABS
650.0000 mg | ORAL_TABLET | ORAL | Status: DC | PRN
  Administered 2014-10-13 – 2014-10-14 (×2): 650 mg via ORAL
  Filled 2014-10-12 (×2): qty 2

## 2014-10-12 MED ORDER — OXYTOCIN 40 UNITS IN LACTATED RINGERS INFUSION - SIMPLE MED: 62.5000 mL/h | INTRAVENOUS | Status: DC

## 2014-10-12 MED ORDER — OXYTOCIN BOLUS FROM INFUSION: 500.0000 mL | INTRAVENOUS | Status: DC

## 2014-10-12 MED ORDER — LACTATED RINGERS IV SOLN
INTRAVENOUS | Status: DC
  Administered 2014-10-12: 125 mL/h via INTRAVENOUS
  Administered 2014-10-12 – 2014-10-13 (×2): via INTRAVENOUS

## 2014-10-12 MED ORDER — MISOPROSTOL 25 MCG QUARTER TABLET
25.0000 ug | ORAL_TABLET | ORAL | Status: DC | PRN
  Administered 2014-10-12 (×3): 25 ug via VAGINAL
  Filled 2014-10-12 (×3): qty 0.25

## 2014-10-12 MED ORDER — ONDANSETRON HCL 4 MG/2ML IJ SOLN
4.0000 mg | Freq: Four times a day (QID) | INTRAMUSCULAR | Status: DC | PRN
  Administered 2014-10-14: 4 mg via INTRAVENOUS
  Filled 2014-10-12: qty 2

## 2014-10-12 MED ORDER — CITRIC ACID-SODIUM CITRATE 334-500 MG/5ML PO SOLN
30.0000 mL | ORAL | Status: DC | PRN
  Administered 2014-10-12 – 2014-10-13 (×2): 30 mL via ORAL
  Filled 2014-10-12 (×2): qty 15

## 2014-10-12 MED ORDER — LACTATED RINGERS IV SOLN: 500.0000 mL | INTRAVENOUS | Status: DC | PRN

## 2014-10-12 MED ORDER — OXYCODONE-ACETAMINOPHEN 5-325 MG PO TABS: 2.0000 | ORAL_TABLET | ORAL | Status: DC | PRN

## 2014-10-12 MED ORDER — OXYCODONE-ACETAMINOPHEN 5-325 MG PO TABS: 1.0000 | ORAL_TABLET | ORAL | Status: DC | PRN

## 2014-10-12 MED ORDER — FLEET ENEMA 7-19 GM/118ML RE ENEM: 1.0000 | ENEMA | RECTAL | Status: DC | PRN

## 2014-10-12 MED ORDER — TERBUTALINE SULFATE 1 MG/ML IJ SOLN: 0.2500 mg | Freq: Once | INTRAMUSCULAR | Status: AC | PRN

## 2014-10-12 MED ORDER — LIDOCAINE HCL (PF) 1 % IJ SOLN
30.0000 mL | INTRAMUSCULAR | Status: DC | PRN
  Filled 2014-10-12: qty 30

## 2014-10-12 NOTE — H&P (Signed)
LABOR ADMISSION HISTORY AND PHYSICAL  Loretta Burton is a 29 y.o. female G1P0000 with IUP at [redacted]w[redacted]d by LMP presenting for IOL 2/2 oligohydraminos. She reports +FM, denies contractions, No LOF, no VB.  She plans on breast feeding. She request POPs for birth control.  Dating: By LMP c/w 7wk [place] --->  Estimated Date of Delivery: 10/13/14  Sono:   , CWD, normal anatomy, cephalic presentation, 2557g, 16% EFW, low nml AF   Prenatal History/Complications: [facility] Prenatal Labs  Dating LMP c/w 7 week clinic scan Blood type:  A pos  Genetic Screen Declines on genetic screening Antibody: neg  Anatomic [place]  normal but incomplete--declines follow-up Rubella:  immune  GTT Early:   130            Third trimester: 92 RPR:   NR  TDaP vaccine Declines HBsAg:   neg  Flu vaccine  10/15 at her job [facility] HIV:   neg  GBS Neg GBS: neg  Contraception  considering LARC Pap: 02/28/14  Baby [employer] Breast   Circumcision girl   Pediatrician [facility]   Support Person Husband     Past Medical History: Past Medical History  Diagnosis Date  . Depression   . Panic attack   . Allergy     seasonal  . Insomnia     Past Surgical History: Past Surgical History  Procedure Laterality Date  . Wisdom tooth extraction      x4    Obstetrical History: OB History    Gravida Para Term Preterm AB TAB SAB Ectopic Multiple Living        Social History: History   Social History  . Marital Status: Single    Spouse Name: N/A  . Number of Children: N/A  . Years of Education: N/A   Social History Main Topics  . Smoking status: Never Smoker   . Smokeless tobacco: Never Used  . Alcohol Use: No  . Drug Use: No  . Sexual Activity:    Partners: Male    Birth Control/ Protection: None   Other Topics Concern  . None   Social History Narrative    Family History: Family History  Problem Relation Age of Onset  . Diabetes Mother   . Hypertension Mother    . Depression Mother   . Diabetes Maternal Grandmother   . Hypertension Maternal Grandmother   . Heart disease Maternal Grandmother   . Cancer Maternal Grandmother     skin  . Diabetes Paternal Grandmother   . Hypertension Paternal Grandmother   . Heart disease Paternal Grandmother   . Diabetes Paternal Grandfather   . Hypertension Paternal Grandfather   . Heart disease Paternal Grandfather     Allergies: Allergies  Allergen Reactions  . Ciprofloxacin Hives and Nausea And Vomiting    Prescriptions prior to admission  Medication Sig Dispense Refill Last Dose  . calcium carbonate (TUMS - DOSED IN MG ELEMENTAL CALCIUM) 500 MG chewable tablet Chew 2 tablets by mouth 3 (three) times daily as needed for indigestion or heartburn.    10/11/2014 at Unknown time  . cetirizine (ZYRTEC) 10 MG tablet Take 10 mg by mouth daily.   Past Week at Unknown time  . docusate sodium (COLACE) 100 MG capsule Take 100 mg by mouth daily as needed for mild constipation.    Past Week at Unknown time  . ranitidine (ZANTAC) 150 MG tablet Take 150 mg by mouth  2 (two) times daily.   10/11/2014 at Unknown time  . ondansetron (ZOFRAN ODT) 4 MG disintegrating tablet Take 1 tablet (4 mg total) by mouth every 6 (six) hours as needed for nausea. 42 tablet 3 prn   Review of Systems  Constitutional: Negative for fever and chills.  Eyes: Negative for blurred vision.  Respiratory: Negative for shortness of breath.   Cardiovascular: Negative for chest pain.  Gastrointestinal: Negative for nausea and vomiting.  Genitourinary: Negative for dysuria.  Neurological: Negative for dizziness and headaches.  Also per HPI  BP 124/68 mmHg  Pulse 96  Temp(Src) 98.6 F (37 C) (Oral)  Resp 20  Ht  (1.651 m)  Wt 227 lb (102.967 kg)  BMI 37.77 kg/m2  LMP 01/06/2014 General appearance: alert, cooperative and no distress Lungs: normal work of breathing Heart: regular rate  Abdomen: soft, non-tender; bowel sounds  normal Extremities: Homans sign is negative, no sign of DVT Presentation: cephalic Fetal monitoringBaseline: 145 bpm, Variability: Good {> 6 bpm), Accelerations: Reactive and Decelerations: Absent Uterine activity: None Dilation: 1 Effacement (%): 70 Station: -3 Exam by:: Delrae Sawyers RN   Prenatal labs: ABO, Rh: --/--/A POS (06/24 1455) Antibody: NEG (06/24 1455) Rubella:  Immune RPR: NON REAC (04/04 1539)  HBsAg: NEGATIVE (11/11 1008)  HIV: NONREACTIVE (04/04 1539)  GBS: Negative (05/31 0000)neg 1 hr Glucola early: 132 3rd trim: 92 Genetic screening declined Anatomy US normal  Prenatal Transfer Tool  Maternal Diabetes: No Genetic Screening: Declined Maternal Ultrasounds/Referrals: Normal Fetal Ultrasounds or other Referrals:  None Maternal Substance Abuse:  No Significant Maternal Medications:  None Significant Maternal Lab Results: None  Results for orders placed or performed during the hospital encounter of 10/12/14 (from the past 24 hour(s))  CBC   Collection Time: 10/12/14  2:55 PM  Result Value Ref Range   WBC 8.9 4.0 - 10.5 K/uL   RBC 3.95 3.87 - 5.11 MIL/uL   Hemoglobin 12.0 12.0 - 15.0 g/dL   HCT 16.1 (L) 09.6 - 04.5 %   MCV 88.1 78.0 - 100.0 fL   MCH 30.4 26.0 - 34.0 pg   MCHC 34.5 30.0 - 36.0 g/dL   RDW 40.9 81.1 - 91.4 %   Platelets 197 150 - 400 K/uL  Type and screen   Collection Time: 10/12/14  2:55 PM  Result Value Ref Range   ABO/RH(D) A POS    Antibody Screen NEG    Sample Expiration 10/15/2014     Patient Active Problem List   Diagnosis Date Noted  . Oligohydramnios in third trimester 10/12/2014  . Oligohydramnios antepartum 09/18/2014  . Supervision of normal pregnancy 02/28/2014  . Anxiety disorder 01/17/2014    Assessment: Loretta Burton is a 29 y.o. G1P0000 at [redacted]w[redacted]d here for IOL for oligohydramnios.   #Labor: Will induce with cytotec #Pain: Plan for epidural #FWB: Cat 1 #ID: GBS neg #MOF: Breast #MOC: POPs #Circ: N/A,  female   Caryl Ada, DO 10/12/2014, 4:07 PM PGY-1, [facility]  CNM attestation:  I have seen and examined this patient; I agree with above documentation in the resident's note.   Loretta Burton is a 29 y.o. G1P0000 here for IOL due to oligo  PE: BP 133/71 mmHg  Pulse 92  Temp(Src) 99.2 F (37.3 C) (Oral)  Resp 20  Ht  (1.651 m)  Wt 227 lb (102.967 kg)  BMI 37.77 kg/m2  LMP 01/06/2014 Gen: calm comfortable, NAD Resp: normal effort, no distress Abd: gravid  ROS,  labs, PMH reviewed  Plan: Admit to [facility] w/ cx ripening using cytotec and then progress to foley bulb/Pit as we can Plan SW consult PP due to hx of drug use  Cam Hai CNM 10/12/2014, 8:55 PM

## 2014-10-12 NOTE — Progress Notes (Signed)
Labor Progress Note  Loretta Burton is a 29 y.o. G1P0000 at [redacted]w[redacted]d  admitted for induction of labor due to Oligohydramnios.  S: Patient doing well. No complaints.   O:  BP 129/70 mmHg  Pulse 94  Temp(Src) 97.9 F (36.6 C) (Oral)  Resp 20  Ht 5\' 5"  (1.651 m)  Wt 227 lb (102.967 kg)  BMI 37.77 kg/m2  LMP 01/06/2014 FHT:  FHR: 145 bpm, variability: moderate,  accelerations:  Present,  decelerations:  Absent UC:   regular, every 2-6 minutes SVE:   Dilation: 1 Effacement (%): 70 Station: -3 Exam by:: S Grindstaff RN  Labs: Lab Results  Component Value Date   WBC 8.9 10/12/2014   HGB 12.0 10/12/2014   HCT 34.8* 10/12/2014   MCV 88.1 10/12/2014   PLT 197 10/12/2014    Assessment / Plan: 29 y.o. G1P0000 [redacted]w[redacted]d not in labor. Doing well. Induction of labor due to oligohydraminos  Labor: Not in active labor yet. Inducing labor with cytotec (now x2).  Fetal Wellbeing:  Category I Pain Control:  Labor support without medications Anticipated MOD:  NSVD  Expectant management   Caryl Ada, DO 10/12/2014, 7:00 PM PGY-1, [facility]

## 2014-10-12 NOTE — MAU Note (Signed)
Patient presents at [redacted] weeks gestation stating that she just left the doctor's office and was sent by her physician for induction of labor due to oligohydramnios. Fetus active but not as much today. Denies discharge but states she has seen a little bloody show after having her membranes stripped in the office.

## 2014-10-12 NOTE — Progress Notes (Signed)
Subjective:  Loretta Burton is a 29 y.o. G1P0000 at [redacted]w[redacted]d being seen today for ongoing prenatal care.  Patient reports backache.  Contractions: Irregular.  Vag. Bleeding: None. Movement: Present. Denies leaking of fluid.   The following portions of the patient's history were reviewed and updated as appropriate: allergies, current medications, past family history, past medical history, past social history, past surgical history and problem list.   Objective:   Filed Vitals:   10/12/14 1011  BP: 141/84  Pulse: 109  Weight: 227 lb (102.967 kg)    Fetal Status: Fetal Heart Rate (bpm): 147   Movement: Present     General:  Alert, oriented and cooperative. Patient is in no acute distress.  Skin: Skin is warm and dry. No rash noted.   Cardiovascular: Normal heart rate noted  Respiratory: Normal respiratory effort, no problems with respiration noted  Abdomen: Soft, gravid, appropriate for gestational age. Pain/Pressure: Present     Vaginal: Vag. Bleeding: None.    Vag D/C Character: Thin  Cervix: Exam revealed       2/95/-2/membranes stripped  Extremities: Normal range of motion.  Edema: Trace  Mental Status: Normal mood and affect. Normal behavior. Normal judgment and thought content.   Urinalysis: Urine Protein: Negative Urine Glucose: Negative   AFI 6 here today NST reactive with irregular contractions  Assessment and Plan:  Pregnancy: G1P0000 at [redacted]w[redacted]d  1. Oligohydramnios antepartum, third trimester, not applicable or unspecified fetus Early labor with oligo. She will go to [facility] for labor/augmentation prn  2. Supervision of normal pregnancy, third trimester    Term labor symptoms and general obstetric precautions including but not limited to vaginal bleeding, contractions, leaking of fluid and fetal movement were reviewed in detail with the patient.  Please refer to After Visit Summary for other counseling recommendations.   No Follow-up on file.   Allie Bossier, MD

## 2014-10-13 LAB — RPR: RPR Ser Ql: NONREACTIVE

## 2014-10-13 MED ORDER — FENTANYL CITRATE (PF) 100 MCG/2ML IJ SOLN
100.0000 ug | INTRAMUSCULAR | Status: DC | PRN
  Administered 2014-10-13: 100 ug via INTRAVENOUS
  Filled 2014-10-13 (×2): qty 2

## 2014-10-13 MED ORDER — TERBUTALINE SULFATE 1 MG/ML IJ SOLN: 0.2500 mg | Freq: Once | INTRAMUSCULAR | Status: AC | PRN

## 2014-10-13 MED ORDER — MISOPROSTOL 200 MCG PO TABS
50.0000 ug | ORAL_TABLET | ORAL | Status: DC | PRN
  Administered 2014-10-13: 50 ug via ORAL
  Filled 2014-10-13: qty 0.5

## 2014-10-13 MED ORDER — OXYTOCIN 40 UNITS IN LACTATED RINGERS INFUSION - SIMPLE MED
1.0000 m[IU]/min | INTRAVENOUS | Status: DC
  Administered 2014-10-13: 2 m[IU]/min via INTRAVENOUS
  Filled 2014-10-13: qty 1000

## 2014-10-13 NOTE — Progress Notes (Signed)
Loretta Burton is a 29 y.o. G1P0000 at [redacted]w[redacted]d   Subjective: Woken from sleep to attempt to place foley bulb; mostly comfortable  Objective: BP 116/63 mmHg  Pulse 55  Temp(Src) 98.2 F (36.8 C) (Oral)  Resp 20  Ht 5\' 5"  (1.651 m)  Wt 102.967 kg (227 lb)  BMI 37.77 kg/m2  LMP 01/06/2014      FHT:  FHR: 130-140 bpm, variability: moderate,  accelerations:  Present,  decelerations:  Absent UC:   irreg SVE:   Dilation: 2 Effacement (%): 80 Station: -1 Exam by:: Loretta Burton RNC--OB- attempted to place foley, but too posterior  Labs: Lab Results  Component Value Date   WBC 8.9 10/12/2014   HGB 12.0 10/12/2014   HCT 34.8* 10/12/2014   MCV 88.1 10/12/2014   PLT 197 10/12/2014    Assessment / Plan: IOL process Oligo Cx unfavorable  Will give a dose of PO cytotec for now, and possibly attempt foley bulb later Report to L Clemmons CNM  Kendricks Reap CNM 10/13/2014, 9:53 AM

## 2014-10-14 ENCOUNTER — Encounter (HOSPITAL_COMMUNITY): Payer: Self-pay | Admitting: *Deleted

## 2014-10-14 ENCOUNTER — Inpatient Hospital Stay (HOSPITAL_COMMUNITY): Payer: PRIVATE HEALTH INSURANCE | Admitting: Anesthesiology

## 2014-10-14 DIAGNOSIS — O4103X0 Oligohydramnios, third trimester, not applicable or unspecified: Secondary | ICD-10-CM

## 2014-10-14 DIAGNOSIS — Z3A39 39 weeks gestation of pregnancy: Secondary | ICD-10-CM

## 2014-10-14 MED ORDER — PHENYLEPHRINE 40 MCG/ML (10ML) SYRINGE FOR IV PUSH (FOR BLOOD PRESSURE SUPPORT)
80.0000 ug | PREFILLED_SYRINGE | INTRAVENOUS | Status: DC | PRN
  Filled 2014-10-14: qty 20
  Filled 2014-10-14: qty 2

## 2014-10-14 MED ORDER — BENZOCAINE-MENTHOL 20-0.5 % EX AERO
1.0000 "application " | INHALATION_SPRAY | CUTANEOUS | Status: DC | PRN
  Administered 2014-10-14: 1 via TOPICAL
  Filled 2014-10-14 (×2): qty 56

## 2014-10-14 MED ORDER — SENNOSIDES-DOCUSATE SODIUM 8.6-50 MG PO TABS
2.0000 | ORAL_TABLET | ORAL | Status: DC
  Administered 2014-10-15 (×2): 2 via ORAL
  Filled 2014-10-14 (×2): qty 2

## 2014-10-14 MED ORDER — ONDANSETRON HCL 4 MG/2ML IJ SOLN: 4.0000 mg | INTRAMUSCULAR | Status: DC | PRN

## 2014-10-14 MED ORDER — OXYTOCIN 40 UNITS IN LACTATED RINGERS INFUSION - SIMPLE MED: 62.5000 mL/h | INTRAVENOUS | Status: DC | PRN

## 2014-10-14 MED ORDER — SODIUM CHLORIDE 0.9 % IV SOLN: 250.0000 mL | INTRAVENOUS | Status: DC | PRN

## 2014-10-14 MED ORDER — SODIUM CHLORIDE 0.9 % IJ SOLN: 3.0000 mL | Freq: Two times a day (BID) | INTRAMUSCULAR | Status: DC

## 2014-10-14 MED ORDER — ACETAMINOPHEN 325 MG PO TABS: 650.0000 mg | ORAL_TABLET | ORAL | Status: DC | PRN

## 2014-10-14 MED ORDER — FENTANYL 2.5 MCG/ML BUPIVACAINE 1/10 % EPIDURAL INFUSION (WH - ANES)
14.0000 mL/h | INTRAMUSCULAR | Status: DC | PRN
  Administered 2014-10-14 (×2): 14 mL/h via EPIDURAL
  Filled 2014-10-14: qty 125

## 2014-10-14 MED ORDER — FENTANYL 2.5 MCG/ML BUPIVACAINE 1/10 % EPIDURAL INFUSION (WH - ANES)
14.0000 mL/h | INTRAMUSCULAR | Status: DC | PRN
  Filled 2014-10-14: qty 125

## 2014-10-14 MED ORDER — DIPHENHYDRAMINE HCL 50 MG/ML IJ SOLN: 12.5000 mg | INTRAMUSCULAR | Status: DC | PRN

## 2014-10-14 MED ORDER — LANOLIN HYDROUS EX OINT
TOPICAL_OINTMENT | CUTANEOUS | Status: DC | PRN
Start: 2014-10-14 — End: 2014-10-16

## 2014-10-14 MED ORDER — TETANUS-DIPHTH-ACELL PERTUSSIS 5-2.5-18.5 LF-MCG/0.5 IM SUSP
0.5000 mL | Freq: Once | INTRAMUSCULAR | Status: DC
  Filled 2014-10-14: qty 0.5

## 2014-10-14 MED ORDER — OXYCODONE-ACETAMINOPHEN 5-325 MG PO TABS: 2.0000 | ORAL_TABLET | ORAL | Status: DC | PRN

## 2014-10-14 MED ORDER — EPHEDRINE 5 MG/ML INJ
10.0000 mg | INTRAVENOUS | Status: DC | PRN
  Filled 2014-10-14: qty 2

## 2014-10-14 MED ORDER — DIPHENHYDRAMINE HCL 25 MG PO CAPS: 25.0000 mg | ORAL_CAPSULE | Freq: Four times a day (QID) | ORAL | Status: DC | PRN

## 2014-10-14 MED ORDER — FENTANYL 2.5 MCG/ML BUPIVACAINE 1/10 % EPIDURAL INFUSION (WH - ANES)
INTRAMUSCULAR | Status: DC | PRN
  Administered 2014-10-14: 14 mL/h via EPIDURAL

## 2014-10-14 MED ORDER — LIDOCAINE HCL (PF) 1 % IJ SOLN
INTRAMUSCULAR | Status: DC | PRN
  Administered 2014-10-14: 11 mL

## 2014-10-14 MED ORDER — ONDANSETRON HCL 4 MG PO TABS: 4.0000 mg | ORAL_TABLET | ORAL | Status: DC | PRN

## 2014-10-14 MED ORDER — OXYCODONE-ACETAMINOPHEN 5-325 MG PO TABS
1.0000 | ORAL_TABLET | ORAL | Status: DC | PRN
  Administered 2014-10-14 – 2014-10-15 (×3): 1 via ORAL
  Filled 2014-10-14 (×3): qty 1

## 2014-10-14 MED ORDER — DIBUCAINE 1 % RE OINT
1.0000 "application " | TOPICAL_OINTMENT | RECTAL | Status: DC | PRN
  Filled 2014-10-14: qty 28

## 2014-10-14 MED ORDER — WITCH HAZEL-GLYCERIN EX PADS: 1.0000 "application " | MEDICATED_PAD | CUTANEOUS | Status: DC | PRN

## 2014-10-14 MED ORDER — IBUPROFEN 600 MG PO TABS
600.0000 mg | ORAL_TABLET | Freq: Four times a day (QID) | ORAL | Status: DC
  Administered 2014-10-14 – 2014-10-16 (×6): 600 mg via ORAL
  Filled 2014-10-14 (×7): qty 1

## 2014-10-14 MED ORDER — ZOLPIDEM TARTRATE 5 MG PO TABS
5.0000 mg | ORAL_TABLET | Freq: Every evening | ORAL | Status: DC | PRN
Start: 2014-10-14 — End: 2014-10-16

## 2014-10-14 MED ORDER — SODIUM CHLORIDE 0.9 % IJ SOLN: 3.0000 mL | INTRAMUSCULAR | Status: DC | PRN

## 2014-10-14 MED ORDER — PRENATAL MULTIVITAMIN CH
1.0000 | ORAL_TABLET | Freq: Every day | ORAL | Status: DC
  Administered 2014-10-15: 1 via ORAL
  Filled 2014-10-14: qty 1

## 2014-10-14 MED ORDER — SIMETHICONE 80 MG PO CHEW
80.0000 mg | CHEWABLE_TABLET | ORAL | Status: DC | PRN
  Filled 2014-10-14: qty 1

## 2014-10-14 NOTE — Consult Note (Signed)
Neonatology Note:   Attendance at Delivery:   I was asked by the OB nurses for D. Lawson, CNM/OB teaching service to check this infant at about 5-6 minutes after NSVD at term. The baby had become dusky at about 4-5 minutes of age and had a HR of 100, not increasing, and decreased muscle tone. The mother is a G1P0 A pos, GBS neg with IOL due to oligohydramnios. ROM 9 hours prior to delivery, fluid clear. Mother had not received Magnesium sulfate nor narcotic analgesia. At birth, infant was dusky with good spontaneous cry but decreased tone. Given stimulation and improved. The baby remained on the mother's chest; a pulse oximeter was placed and the HR was found to be about 100. The baby was placed under a warmer and given BBO2. I was called and I arrived at about 6-7 minutes of life. The baby was pink, well-perfused, with RR 60-70 and unlabored, with excellent air exchange that was equal bilaterally. HR was 130, there were no murmurs, and O2 saturations remained above 90% when supplemental O2 was withdrawn. I observed her for several more minutes and she appeared stable. Ap 6/8/9. I spoke with the parents, then transferred care of the baby to the Pediatrician. I instructed the nurses to leave the pulse oximeter on the baby for at least a few more minutes during skin to skin time and to take her to CN if it dropped below 90% for more than a few seconds, to allow for closer observation during transition, if necessary.  Loretta Spinello C. Audianna Landgren, MD 

## 2014-10-14 NOTE — Anesthesia Preprocedure Evaluation (Signed)
Anesthesia Evaluation  Patient identified by MRN, date of birth, ID band Patient awake and Patient confused    Reviewed: Allergy & Precautions, H&P , NPO status , Patient's Chart, lab work & pertinent test results  Airway Mallampati: II       Dental   Pulmonary  breath sounds clear to auscultation  Pulmonary exam normal       Cardiovascular Exercise Tolerance: Good Normal cardiovascular examRhythm:regular Rate:Normal     Neuro/Psych    GI/Hepatic   Endo/Other  Morbid obesity  Renal/GU      Musculoskeletal   Abdominal   Peds  Hematology   Anesthesia Other Findings   Reproductive/Obstetrics (+) Pregnancy                             Anesthesia Physical Anesthesia Plan  ASA: III  Anesthesia Plan:    Post-op Pain Management:    Induction:   Airway Management Planned:   Additional Equipment:   Intra-op Plan:   Post-operative Plan:   Informed Consent: I have reviewed the patients History and Physical, chart, labs and discussed the procedure including the risks, benefits and alternatives for the proposed anesthesia with the patient or authorized representative who has indicated his/her understanding and acceptance.     Plan Discussed with:   Anesthesia Plan Comments:         Anesthesia Quick Evaluation

## 2014-10-14 NOTE — Anesthesia Procedure Notes (Signed)
Epidural Patient location during procedure: OB Start time: 10/14/2014 6:15 AM End time: 10/14/2014 6:28 AM  Staffing Anesthesiologist: Sebastian Ache  Preanesthetic Checklist Completed: patient identified, site marked, surgical consent, pre-op evaluation, timeout performed, IV checked, risks and benefits discussed and monitors and equipment checked  Epidural Patient position: sitting Prep: Betadine Patient monitoring: heart rate, continuous pulse ox and blood pressure Approach: midline Location: L2-L3 Injection technique: LOR air  Needle:  Needle type: Tuohy  Needle gauge: 17 G Needle length: 9 cm and 9 Needle insertion depth: 6 cm Catheter type: closed end flexible Catheter size: 20 Guage Catheter at skin depth: 14 cm Test dose: negative  Assessment Events: blood not aspirated, injection not painful, no injection resistance, negative IV test and no paresthesia  Additional Notes   Patient tolerated the insertion well without complications.Reason for block:procedure for pain

## 2014-10-14 NOTE — Progress Notes (Signed)
Labor Progress Note  S: comfortable  O:  BP 107/64 mmHg  Pulse 88  Temp(Src) 98.4 F (36.9 C) (Oral)  Resp 18  Ht 5\' 5"  (1.651 m)  Wt 227 lb (102.967 kg)  BMI 37.77 kg/m2  LMP 01/06/2014 Cat I CVE: 3/80/-2  A&P: 29 y.o. G1P0000 [redacted]w[redacted]d IOL 2/2 oligo #labor: progressing slowly, s/p cytotec x 5, pitocin and multiple attempts at [facility] placement.  Given dilation of 3cm, unlikely that FB will help much.  Start pitocin after pt showers (per pt request)  Perry Mount, MD 3:57 AM

## 2014-10-14 NOTE — Progress Notes (Signed)
Loretta Burton is a 29 y.o. G1P0000 at [redacted]w[redacted]d by ultrasound admitted for induction of labor due to Low amniotic fluid..  Subjective:   Objective: BP 116/63 mmHg  Pulse 71  Temp(Src) 98.6 F (37 C) (Oral)  Resp 18  Ht 5\' 5"  (1.651 m)  Wt 227 lb (102.967 kg)  BMI 37.77 kg/m2  SpO2 99%  LMP 01/06/2014      FHT:  FHR: 130's bpm, variability: moderate,  accelerations:  Present,  decelerations:  Absent UC:   regular, every 2-3 minutes SVE:   Dilation: 4 Effacement (%): 80 Station: -1 Exam by:: lawson  Labs: Lab Results  Component Value Date   WBC 8.9 10/12/2014   HGB 12.0 10/12/2014   HCT 34.8* 10/12/2014   MCV 88.1 10/12/2014   PLT 197 10/12/2014    Assessment / Plan: Induction of labor due to low fluid,  progressing well on pitocin  Labor: latent early labor Preeclampsia:  no signs or symptoms of toxicity Fetal Wellbeing:  Category I Pain Control:  Epidural I/D:  n/a Anticipated MOD:  NSVD  LAWSON, MARIE DARLENE 10/14/2014, 9:10 AM

## 2014-10-14 NOTE — Progress Notes (Signed)
Loretta Burton is a 29 y.o. G1P0000 at [redacted]w[redacted]d by ultrasound admitted for induction of labor due to Low amniotic fluid..  Subjective:   Objective: BP 123/57 mmHg  Pulse 89  Temp(Src) 98.4 F (36.9 C) (Oral)  Resp 18  Ht 5\' 5"  (1.651 m)  Wt 227 lb (102.967 kg)  BMI 37.77 kg/m2  SpO2 99%  LMP 01/06/2014      FHT:  FHR: 135 bpm, variability: moderate,  accelerations:  Present,  decelerations:  Present variable UC:   regular, every 3 minutes SVE:   Dilation: 10 Effacement (%): 80 Station: 0 Exam by:: hayes  Labs: Lab Results  Component Value Date   WBC 8.9 10/12/2014   HGB 12.0 10/12/2014   HCT 34.8* 10/12/2014   MCV 88.1 10/12/2014   PLT 197 10/12/2014    Assessment / Plan: Induction of labor due to low fluid,  progressing well on pitocin  Labor: Progressing normally Preeclampsia:  no signs or symptoms of toxicity Fetal Wellbeing:  Category I Pain Control:  Epidural I/D:  n/a Anticipated MOD:  NSVD  LAWSON, MARIE DARLENE 10/14/2014, 12:58 PM

## 2014-10-15 NOTE — Progress Notes (Signed)
CLINICAL SOCIAL WORK MATERNAL/CHILD NOTE  Patient Details  Name: Girl Dicie Nuckles MRN: [id number] Date of Birth: 10/14/2012  Date:  10/15/2014  Clinical Social Worker Initiating Note:  Maron Stanzione, LCSW Date/ Time Initiated:  10/15/14/0945     Child's Name:  Penelope   Legal Guardian:  Semya Lamartina and Kevin (parents)  Need for Interpreter:  None   Date of Referral:  10/14/14     Reason for Referral: History of depression/anxiety  Referral Source:  Central Nursery   Address:  [address] [place], [place] [address]  Phone number:  [phone]   Household Members:  Significant Other   Natural Supports (not living in the home):  Immediate Family (mother-in-law lives across the street), Church, Extended Family   Professional Supports: None   Employment: Full-time   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 She was previously prescribed Lexapro and Xanax, medications managed by a psychiatrist. MOB has not been prescribed medications during the pregnancy, MOB declined restarting medications in May due to increase in anxiety.  MOB currently presents as emotional, tearful, and anxious.  Cognitive State:  Able to Concentrate , Alert , Goal Oriented , Linear Thinking , Insightful    Mood/Affect:  Anxious , Happy , Interested    CSW Assessment:  CSW received referral due to MOB presenting with a history of depression/anxiety.  MOB presented as easily engaged and receptive to the visit. She displayed a full range in affect, but was noted to be emotional/tearful. MOB presented as  anxious/nervous as evidenced by her speech being slightly pressured while simultaneously scratching her arms.  FOB was also in the room for the assessment, but he was sleeping during the entire visit.  MOB shared that she and the FOB have created a schedule in order to "take turns" in regards to providing infant care and sleeping.  MOB presents with insight and self-awareness related to her current mental health and her potential needs as she transitions to the postpartum period.   CSW provided brief supportive therapy as she reflects upon her thoughts and feelings as she transitions to the postpartum period.  Per MOB, she feels satisfied and content with her birthing experience despite learning on 6/24 need to be induced due to low amniotic fluid.  She shared that she has had time to prepare for the infant and was "ready" for the infant to be born.  MOB openly discussed how she feels stressed and overwhelmed about breastfeeding since she was not prepared for some of the difficulties that she may encounter.  CSW noted that MOB called herself a "failure" for initial difficulties. Upon further exploration, she recognizes that she is not responsible for the difficulties since breastfeeding is new for both herself and the infant. She stated that she knows that the infant is having a difficult time latching, and that she is doing the best she can.  MOB verbalized negative core beliefs about herself, and continued to discuss how it is difficult/overwhelming for her to hear the infant cry when she attempts to initiate breastfeeding since she is concerned that something is wrong with   the infant when she cries. She presents with high expectations for herself as a mother, and while she reports recognizing that has high standards (often unrealistic), she struggles to remind herself that perfection is not possible as a mother and that she is doing the best she can.  CSW validated and normalized her feelings, and continue to  explore cognitive techniques that may assist her to disengage from anxious thought processes.   MOB was noted to be tearful as she discussed these events, and shared that she has noted an increase in emotions since the infant has been born.  MOB shared that she hopes that she is experiencing just the "baby blues", since she knows that she is at a heightened risk for developing postpartum depression/anxiety due to her mental health history.  MOB reported history of depression/anxiety that was treated with Lexapro, Xanax, and Trazodone (to help with insomnia). Per MOB, she had been slowly weaning her medications with assistance of the psychiatrist when she learned of the pregnancy.  MOB stated that her OB explored re-starting her medications during the pregnancy due to increase in anxiety, but she declined prescription.  MOB discussed how stress can be situational, and that the FOB can "help" by talking to his family (which can trigger anxiety).    Per MOB, she and the FOB have discussed their plans to support her mental health in upcoming weeks.  MOB presents with insight about non-clinical interventions, such as sleep, healthy diets, and exercise.  She shared that she has strong family support that will help to ensure that she is able to sleep, and discussed how she and the FOB have divided up household chores so that she has less to "worry" about.  MOB stated that if after the 1-2 week period of the baby blues, if she continues to present with symptoms of depression or anxiety, she will consider re-starting her medications. She stated that she if notes that she is withdrawing from the infant or that her anxiety is a barrier to positive interactions with the infant, she will notify her medical providers. MOB stated that she can return to her psychiatrist or her OB if she needs to re-start medications.  MOB acknowledged that symptoms can be treated, and she presented with insight on benefit of notifying providers  early to reduce worsening of symptoms.  MOB denied additional questions, concerns, or needs at this time. She expressed appreciation for the visit, and agreed to contact CSW if specific needs arise.   CSW Plan/Description:   1)Patient/Family Education: Perinatal mood and anxiety disorders 2)No Further Intervention Required/No Barriers to Discharge    Kelby FamVenning, Charlyn Vialpando N, LCSW 10/15/2014, 10:48 AM

## 2014-10-15 NOTE — Progress Notes (Signed)
Post Partum Day 1 Subjective: no complaints, up ad lib, voiding and tolerating PO  Objective: Blood pressure 105/52, pulse 79, temperature 98 F (36.7 C), temperature source Oral, resp. rate 20, height 5\' 5"  (1.651 m), weight 227 lb (102.967 kg), last menstrual period 01/06/2014, SpO2 99 %, unknown if currently breastfeeding.  Physical Exam:  General: alert, cooperative, appears stated age and no distress Lochia: appropriate Uterine Fundus: firm Incision: healing well DVT Evaluation: No evidence of DVT seen on physical exam. Negative Homan's sign. No cords or calf tenderness. No significant calf/ankle edema.   Recent Labs  10/12/14 1455  HGB 12.0  HCT 34.8*    Assessment/Plan: Plan for discharge tomorrow   LOS: 3 days   LAWSON, MARIE DARLENE 10/15/2014, 7:12 AM

## 2014-10-15 NOTE — Lactation Note (Signed)
This [facility] Lactation Consultation Note  Baby is not BF well.  She is very agitated and cries when disturbed.  She acts hungry but does not suckle well even on a gloved finger.  Baby elevates tongue to the roof of her mouth and a frenum is noted that inserts about 2 mm from the tip of the tongue.  I have not seen her extend her tongue.  After much coaxing she suckled on a gloved finger.  The posterior portion of her mouth is tight and with jaw massage it relaxed a bit.  SHe finally attached to the breast but I suspect that she had the nipple with NS in place positioned under her tongue.  She sucked a few times, became frustrated and detached crying.  Mom has colostrum so we hand expressed 8 ml and finger-fed it to baby using a curved tipped syringe.  I suggested putting the baby to breast now that she was less hungry.  SHe was almost asleep but still cueing a bit so she might attach easier. Plan is to offer the breast, hand express and finger feed if necessary and pump to increase production. Patient Name: Loretta Burton ZOXWR'UToday's Date: 10/15/2014     Maternal Data Has patient been taught Hand Expression?: Yes Does the patient have breastfeeding experience prior to this delivery?: No  Feeding Feeding Type: Breast Milk  LATCH Score/Interventions Latch: Too sleepy or reluctant, no latch achieved, no sucking elicited.  Audible Swallowing: None  Type of Nipple: Everted at rest and after stimulation  Comfort (Breast/Nipple): Soft / non-tender     Hold (Positioning): Assistance needed to correctly position infant at breast and maintain latch.  LATCH Score: 5  Lactation Tools Discussed/Used Nipple shield size: 20   Consult Status      Soyla DryerJoseph, Dimitria Ketchum 10/15/2014, 6:19 PM

## 2014-10-15 NOTE — Anesthesia Postprocedure Evaluation (Signed)
  Anesthesia Post-op Note  Patient: Loretta Burton  Procedure(s) Performed: * No procedures listed *  Patient Location: Mother/Baby  Anesthesia Type:Epidural  Level of Consciousness: awake, alert , oriented and patient cooperative  Airway and Oxygen Therapy: Patient Spontanous Breathing  Post-op Pain: none  Post-op Assessment: Post-op Vital signs reviewed, Patient's Cardiovascular Status Stable, Respiratory Function Stable, Patent Airway, No headache, No backache and Patient able to bend at knees              Post-op Vital Signs: Reviewed and stable  Last Vitals:  Filed Vitals:   10/15/14 0600  BP: 105/52  Pulse: 79  Temp: 36.7 C  Resp: 20    Complications: No apparent anesthesia complications

## 2014-10-16 MED ORDER — NORETHINDRONE 0.35 MG PO TABS: 1.0000 | ORAL_TABLET | Freq: Every day | ORAL | Status: DC

## 2014-10-16 MED ORDER — IBUPROFEN 600 MG PO TABS: 600.0000 mg | ORAL_TABLET | Freq: Four times a day (QID) | ORAL | Status: DC

## 2014-10-16 MED ORDER — PRENATAL MULTIVITAMIN CH: 1.0000 | ORAL_TABLET | Freq: Every day | ORAL | Status: DC

## 2014-10-16 NOTE — Discharge Summary (Signed)
Obstetric Discharge Summary Reason for Admission: induction of labor Prenatal Procedures: NST and ultrasound Intrapartum Procedures: spontaneous vaginal delivery Postpartum Procedures: none Complications-Operative and Postpartum: none  Delivery Summary: At 4:13 PM a viable female was delivered via Vaginal, Spontaneous Delivery (Presentation: ;left Occiput Anterior). APGAR: 6, 8.Placenta status: Intact, Spontaneous. Cord: 3 vessels with the following complications: None. Cord pH: n/a  Anesthesia: Epidural  Episiotomy: None Lacerations: 1st degree Suture Repair: 2.0 vicryl Est. Blood Loss (mL): 50  Hospital Course:  Active Problems:   Oligohydramnios in third trimester   Loretta Burton is a 29 y.o. G1P1001 s/p SVD.  Patient was admitted IOL 2/2 oligohydraminos.  She has postpartum course that was uncomplicated including no problems with ambulating, PO intake, urination, pain, or bleeding. The pt feels ready to go home and  will be discharged with outpatient follow-up.   Today: No acute events overnight.  Pt denies problems with ambulating, voiding or po intake.  She denies nausea or vomiting.  Pain is well controlled.  She has had flatus. She has had bowel movement.  Lochia Small.  Plan for birth control is  oral progesterone-only contraceptive.  Method of Feeding: Breast.  Physical Exam:  General: alert, cooperative and no distress Lochia: appropriate Uterine Fundus: firm DVT Evaluation: No evidence of DVT seen on physical exam.  H/H: Lab Results  Component Value Date/Time   HGB 12.0 10/12/2014 02:55 PM   HCT 34.8* 10/12/2014 02:55 PM    Discharge Diagnoses: Term Pregnancy-delivered  Discharge Information: Date: 10/16/2014 Activity: pelvic rest Diet: routine  Medications: PNV, Ibuprofen and Colace Breast feeding:  Yes Condition: stable Instructions: refer to handout Discharge to: home   Discharge Instructions    Increase activity slowly    Complete by:  As  directed             Medication List    TAKE these medications        calcium carbonate 500 MG chewable tablet  Commonly known as:  TUMS - dosed in mg elemental calcium  Chew 2 tablets by mouth 3 (three) times daily as needed for indigestion or heartburn.     cetirizine 10 MG tablet  Commonly known as:  ZYRTEC  Take 10 mg by mouth daily.     docusate sodium 100 MG capsule  Commonly known as:  COLACE  Take 100 mg by mouth daily as needed for mild constipation.     ibuprofen 600 MG tablet  Commonly known as:  ADVIL,MOTRIN  Take 1 tablet (600 mg total) by mouth every 6 (six) hours.     ondansetron 4 MG disintegrating tablet  Commonly known as:  ZOFRAN ODT  Take 1 tablet (4 mg total) by mouth every 6 (six) hours as needed for nausea.     prenatal multivitamin Tabs tablet  Take 1 tablet by mouth daily at 12 noon.     ranitidine 150 MG tablet  Commonly known as:  ZANTAC  Take 150 mg by mouth 2 (two) times daily.           Follow-up Information    Schedule an appointment as soon as possible for [facility]   Specialty:  Obstetrics and Gynecology   Why:  for post-partum visit   Contact information:   [address] [place] [phone]      Caryl AdaJazma Phelps, DO 10/16/2014, 8:30 AM PGY-1, [facility]

## 2014-10-16 NOTE — Discharge Instructions (Signed)

## 2014-11-26 ENCOUNTER — Encounter: Payer: Self-pay | Admitting: Surgery

## 2014-11-26 ENCOUNTER — Emergency Department: Payer: PRIVATE HEALTH INSURANCE

## 2014-11-26 ENCOUNTER — Encounter: Payer: Self-pay | Admitting: *Deleted

## 2014-11-26 ENCOUNTER — Ambulatory Visit (INDEPENDENT_AMBULATORY_CARE_PROVIDER_SITE_OTHER): Payer: 59 | Admitting: Surgery

## 2014-11-26 ENCOUNTER — Emergency Department
Admission: EM | Admit: 2014-11-26 | Discharge: 2014-11-26 | Disposition: A | Discharge status: Home / Self Care | Payer: PRIVATE HEALTH INSURANCE | Attending: Emergency Medicine | Admitting: Emergency Medicine

## 2014-11-26 VITALS — BP 133/68 | HR 48 | Temp 97.9°F | Ht 65.0 in | Wt 193.0 lb

## 2014-11-26 DIAGNOSIS — R1011 Right upper quadrant pain: Secondary | ICD-10-CM

## 2014-11-26 DIAGNOSIS — K8001 Calculus of gallbladder with acute cholecystitis with obstruction: Secondary | ICD-10-CM | POA: Diagnosis not present

## 2014-11-26 DIAGNOSIS — K8020 Calculus of gallbladder without cholecystitis without obstruction: Secondary | ICD-10-CM | POA: Insufficient documentation

## 2014-11-26 DIAGNOSIS — Z79899 Other long term (current) drug therapy: Secondary | ICD-10-CM | POA: Insufficient documentation

## 2014-11-26 LAB — COMPREHENSIVE METABOLIC PANEL
ALT: 57 U/L — ABNORMAL HIGH (ref 14–54)
ANION GAP: 13 (ref 5–15)
AST: 72 U/L — AB (ref 15–41)
Albumin: 4.6 g/dL (ref 3.5–5.0)
Alkaline Phosphatase: 117 U/L (ref 38–126)
BUN: 12 mg/dL (ref 6–20)
CALCIUM: 9.9 mg/dL (ref 8.9–10.3)
CHLORIDE: 104 mmol/L (ref 101–111)
CO2: 27 mmol/L (ref 22–32)
CREATININE: 0.82 mg/dL (ref 0.44–1.00)
GLUCOSE: 91 mg/dL (ref 65–99)
Potassium: 3.3 mmol/L — ABNORMAL LOW (ref 3.5–5.1)
SODIUM: 144 mmol/L (ref 135–145)
TOTAL PROTEIN: 8 g/dL (ref 6.5–8.1)
Total Bilirubin: 0.5 mg/dL (ref 0.3–1.2)

## 2014-11-26 LAB — CBC
HCT: 38.4 % (ref 35.0–47.0)
Hemoglobin: 13.1 g/dL (ref 12.0–16.0)
MCH: 29.7 pg (ref 26.0–34.0)
MCHC: 34.1 g/dL (ref 32.0–36.0)
MCV: 87.1 fL (ref 80.0–100.0)
PLATELETS: 265 10*3/uL (ref 150–440)
RBC: 4.41 MIL/uL (ref 3.80–5.20)
RDW: 13.6 % (ref 11.5–14.5)
WBC: 7.9 10*3/uL (ref 3.6–11.0)

## 2014-11-26 LAB — LIPASE, BLOOD: LIPASE: 22 U/L (ref 22–51)

## 2014-11-26 MED ORDER — HYDROCODONE-ACETAMINOPHEN 5-325 MG PO TABS: 1.0000 | ORAL_TABLET | ORAL | Status: DC | PRN

## 2014-11-26 MED ORDER — ONDANSETRON HCL 4 MG/2ML IJ SOLN
4.0000 mg | Freq: Once | INTRAMUSCULAR | Status: AC
  Administered 2014-11-26: 4 mg via INTRAVENOUS
  Filled 2014-11-26: qty 2

## 2014-11-26 MED ORDER — SODIUM CHLORIDE 0.9 % IV BOLUS (SEPSIS)
1000.0000 mL | Freq: Once | INTRAVENOUS | Status: AC
  Administered 2014-11-26: 1000 mL via INTRAVENOUS

## 2014-11-26 MED ORDER — HYDROMORPHONE HCL 1 MG/ML IJ SOLN
1.0000 mg | Freq: Once | INTRAMUSCULAR | Status: AC
  Administered 2014-11-26: 1 mg via INTRAVENOUS
  Filled 2014-11-26: qty 1

## 2014-11-26 MED ORDER — ONDANSETRON HCL 4 MG PO TABS: 4.0000 mg | ORAL_TABLET | Freq: Every day | ORAL | Status: DC | PRN

## 2014-11-26 NOTE — ED Provider Notes (Addendum)
[facility] Emergency Department Provider Note ___________________________________________  Time seen: Approximately 6:27 AM  I have reviewed the triage vital signs and the nursing notes.   HISTORY  Chief Complaint Abdominal Pain  HPI Loretta Burton is a 29 y.o. female who is complaining of pain in her right upper quadrant that started early this morning much worse but has been off and on throughout her most recent pregnancy and since that time her pregnancy. Patient delivered 6 weeks ago and has continued to have this  right upper quadrant pain.Patient denies any associated fever, chills, vomiting, diarrhea, or any other symptoms. Patient denies any cough or cold symptoms. Patient denies any urinary symptoms. Patient states that her pain appears to get worse when she eats or drinks.   Past Medical History  Diagnosis Date  . Depression   . Panic attack   . Allergy     seasonal  . Insomnia     Patient Active Problem List   Diagnosis Date Noted  . Oligohydramnios in third trimester 10/12/2014  . Oligohydramnios antepartum 09/18/2014  . Supervision of normal pregnancy 02/28/2014  . Anxiety disorder 01/17/2014    Past Surgical History  Procedure Laterality Date  . Wisdom tooth extraction      x4    Current Outpatient Rx  Name  Route  Sig  Dispense  Refill  . ibuprofen (ADVIL,MOTRIN) 600 MG tablet   Oral   Take 1 tablet (600 mg total) by mouth every 6 (six) hours.   30 tablet   0   . Prenatal Vit-Fe Fumarate-FA (PRENATAL MULTIVITAMIN) TABS tablet   Oral   Take 1 tablet by mouth daily at 12 noon.   30 tablet   0   . ranitidine (ZANTAC) 150 MG tablet   Oral   Take 150 mg by mouth 2 (two) times daily.         . calcium carbonate (TUMS - DOSED IN MG ELEMENTAL CALCIUM) 500 MG chewable tablet   Oral   Chew 2 tablets by mouth 3 (three) times daily as needed for indigestion or heartburn.          . cetirizine (ZYRTEC) 10 MG tablet    Oral   Take 10 mg by mouth daily.         [name] docusate sodium (COLACE) 100 MG capsule   Oral   Take 100 mg by mouth daily as needed for mild constipation.          [name] HYDROcodone-acetaminophen (NORCO/VICODIN) 5-325 MG per tablet   Oral   Take 1 tablet by mouth every 4 (four) hours as needed for moderate pain.   20 tablet   0   . norethindrone (MICRONOR,CAMILA,ERRIN) 0.35 MG tablet   Oral   Take 1 tablet (0.35 mg total) by mouth daily.   1 Package   11   . norethindrone (ORTHO MICRONOR) 0.35 MG tablet   Oral   Take 1 tablet (0.35 mg total) by mouth daily.   1 Package   11   . ondansetron (ZOFRAN ODT) 4 MG disintegrating tablet   Oral   Take 1 tablet (4 mg total) by mouth every 6 (six) hours as needed for nausea.   42 tablet   3   . ondansetron (ZOFRAN) 4 MG tablet   Oral   Take 1 tablet (4 mg total) by mouth daily as needed for nausea or vomiting.   10 tablet   1     Allergies Ciprofloxacin  Family  History  Problem Relation Age of Onset  . Diabetes Mother   . Hypertension Mother   . Depression Mother   . Diabetes Maternal Grandmother   . Hypertension Maternal Grandmother   . Heart disease Maternal Grandmother   . Cancer Maternal Grandmother     skin  . Diabetes Paternal Grandmother   . Hypertension Paternal Grandmother   . Heart disease Paternal Grandmother   . Diabetes Paternal Grandfather   . Hypertension Paternal Grandfather   . Heart disease Paternal Grandfather     Social History History  Substance Use Topics  . Smoking status: Never Smoker   . Smokeless tobacco: Never Used  . Alcohol Use: No    Review of Systems Constitutional: No fever/chills Eyes: No visual changes. ENT: No sore throat. Cardiovascular: Denies chest pain. Respiratory: Denies shortness of breath. Gastrointestinal: Patient with abdominal pain right upper quadrant..  Patient was severe nausea but  no vomiting.  No diarrhea.  No constipation. Genitourinary: Negative for  dysuria. Musculoskeletal: Negative for back pain. Skin: Negative for rash. Neurological: Negative for headaches, focal weakness or numbness. 10-point ROS otherwise negative.  ____________________________________________   PHYSICAL EXAM:  VITAL SIGNS: ED Triage Vitals  Enc Vitals Group     BP 11/26/14 0434 123/41 mmHg     Pulse Rate 11/26/14 0434 100     Resp 11/26/14 0434 22     Temp 11/26/14 0434 97.8 F (36.6 C)     Temp Source 11/26/14 0434 Oral     SpO2 11/26/14 0434 99 %     Weight 11/26/14 0434 191 lb (86.637 kg)     Height 11/26/14 0434 5\' 4"  (1.626 m)     Head Cir --      Peak Flow --      Pain Score 11/26/14 0435 9     Pain Loc --      Pain Edu? --      Excl. in GC? --      Constitutional: Alert and oriented. Well appearing and in mild distress secondary to her pain. Eyes: Conjunctivae are normal. PERRL. EOMI. Head: Atraumatic. Nose: No congestion/rhinnorhea. Mouth/Throat: Mucous membranes are moist.  Oropharynx non-erythematous. Neck: No stridor.   Cardiovascular: Normal rate, regular rhythm. Grossly normal heart sounds.  Good peripheral circulation. Respiratory: Normal respiratory effort.  No retractions. Lungs CTAB. Gastrointestinal: Soft and tender to palpation in her right upper quadrant with positive Murphy sign.. No distention. No abdominal bruits. No CVA tenderness. Musculoskeletal: No lower extremity tenderness nor edema.  No joint effusions. Neurologic:  Normal speech and language. No gross focal neurologic deficits are appreciated. No gait instability. Skin:  Skin is warm, dry and intact. No rash noted. Psychiatric: Mood and affect are normal. Speech and behavior are normal.  ____________________________________________   LABS (all labs ordered are listed, but only abnormal results are displayed)  Labs Reviewed  COMPREHENSIVE METABOLIC PANEL - Abnormal; Notable for the following:    Potassium 3.3 (*)    AST 72 (*)    ALT 57 (*)    All other  components within normal limits  LIPASE, BLOOD  CBC  URINALYSIS COMPLETEWITH MICROSCOPIC ([facility] ONLY)   ____________________________________________  EKG  None  RADIOLOGY ultrasound right upper quadrant pending ____________________________________________   PROCEDURES  Procedure(s) performed: None  Critical Care performed: No  ____________________________________________   INITIAL IMPRESSION / ASSESSMENT AND PLAN / ED COURSE  Pertinent labs & imaging results that were available during my care of the patient were reviewed by me and considered  in my medical decision making (see chart for details). ----------------------------------------- 6:32 AM on 11/26/2014 -----------------------------------------  Patient was given some pain and nausea medicine and we will get a right upper quadrant ultrasound.  ____________________________________________  ----------------------------------------- 6:55 AM on 11/26/2014 -----------------------------------------  Patient skin her pain meds at this time and still awaiting ultrasound.  ----------------------------------------- 7:19 AM on 11/26/2014 -----------------------------------------  Patient's ultrasound showed cholelithiasis and her liver enzymes were minimally elevated. She is going to be given some hydrocodone and Zofran ODT to take at home for her pain and nausea intermittently and we are going to refer her to Dr. Tonita Cong for general surgery as soon as possible to remove her gallbladder. Patient is to do a bland diet until that time.  FINAL CLINICAL IMPRESSION(S) / ED DIAGNOSES  Final diagnoses:  Right upper quadrant abdominal pain  Gallstones      Leona Carry, MD 11/26/14 1610  Leona Carry, MD 11/26/14 0730

## 2014-11-26 NOTE — Progress Notes (Signed)
Surgical Consultation  11/26/2014  Loretta Burton is an 29 y.o. female.   CC: Right upper quadrant abdominal pain  HPI: This a patient who is in the emergency room early this morning with episodic right upper quadrant pain. She is postpartum, 6 weeks. Patient reports multiple episodes starting when she was several weeks pregnant those episodes were minor in nature but have increased in severity and intensity and duration. Now she's having a fairly frequently. Her most recent one started at 3 AM this morning after eating keep steak with gravy. She's had nausea and a single emesis today but is feeling much better now she has taken a Norco. A nice fevers or chills denies jaundice or acholic stools denies weight loss. Family members have had similar episodes and been diagnosed with gallbladder disease. Patient is breast-feeding and is taking amoxicillin for a sinus infection.  Past Medical History  Diagnosis Date  . Depression   . Panic attack   . Allergy     seasonal  . Insomnia     Past Surgical History  Procedure Laterality Date  . Wisdom tooth extraction      x4    Family History  Problem Relation Age of Onset  . Diabetes Mother   . Hypertension Mother   . Depression Mother   . Diabetes Maternal Grandmother   . Hypertension Maternal Grandmother   . Heart disease Maternal Grandmother   . Cancer Maternal Grandmother     skin  . Diabetes Paternal Grandmother   . Hypertension Paternal Grandmother   . Heart disease Paternal Grandmother   . Diabetes Paternal Grandfather   . Hypertension Paternal Grandfather   . Heart disease Paternal Grandfather     Social History:  reports that she has never smoked. She has never used smokeless tobacco. She reports that she does not drink alcohol or use illicit drugs.  Allergies:  Allergies  Allergen Reactions  . Ciprofloxacin Hives and Nausea And Vomiting    Medications reviewed.   Review of Systems:   Review of Systems   Constitutional: Negative for fever and chills.  HENT: Negative.   Eyes: Negative.   Respiratory: Negative for cough, hemoptysis and shortness of breath.   Cardiovascular: Negative for chest pain, palpitations and leg swelling.  Gastrointestinal: Positive for nausea, vomiting and abdominal pain. Negative for heartburn, diarrhea, constipation, blood in stool and melena.       No acholic stools. Has had some back pain.  Genitourinary: Positive for flank pain. Negative for dysuria and urgency.  Musculoskeletal: Negative.   Skin: Negative.   Neurological: Negative.  Negative for weakness.  Endo/Heme/Allergies: Negative.   Psychiatric/Behavioral: Negative.      Physical Exam:  BP 133/68 mmHg  Pulse 48  Temp(Src) 97.9 F (36.6 C) (Oral)  Ht '5\' 5"'  (1.651 m)  Wt 193 lb (87.544 kg)  BMI 32.12 kg/m2  LMP 11/26/2014  Physical Exam  Constitutional: She is oriented to person, place, and time and well-developed, well-nourished, and in no distress. No distress.  HENT:  Head: Normocephalic and atraumatic.  Eyes: No scleral icterus.  Neck: Normal range of motion. Neck supple.  Cardiovascular: Normal rate and regular rhythm.   Pulmonary/Chest: Effort normal and breath sounds normal. No respiratory distress. She has no wheezes. She has no rales.  Abdominal: Soft. Bowel sounds are normal. She exhibits no distension. There is tenderness. There is no rebound and no guarding.  Minimal right upper quadrant tenderness without Murphy's sign  Musculoskeletal: Normal range of motion. She  exhibits no edema.  Lymphadenopathy:    She has no cervical adenopathy.  Neurological: She is alert and oriented to person, place, and time.  Skin: Skin is warm and dry.  Psychiatric: Mood, affect and judgment normal.      Results for orders placed or performed during the hospital encounter of 11/26/14 (from the past 48 hour(s))  Lipase, blood     Status: None   Collection Time: 11/26/14  4:43 AM  Result Value  Ref Range   Lipase 22 22 - 51 U/L  Comprehensive metabolic panel     Status: Abnormal   Collection Time: 11/26/14  4:43 AM  Result Value Ref Range   Sodium 144 135 - 145 mmol/L   Potassium 3.3 (L) 3.5 - 5.1 mmol/L   Chloride 104 101 - 111 mmol/L   CO2 27 22 - 32 mmol/L   Glucose, Bld 91 65 - 99 mg/dL   BUN 12 6 - 20 mg/dL   Creatinine, Ser 0.82 0.44 - 1.00 mg/dL   Calcium 9.9 8.9 - 10.3 mg/dL   Total Protein 8.0 6.5 - 8.1 g/dL   Albumin 4.6 3.5 - 5.0 g/dL   AST 72 (H) 15 - 41 U/L   ALT 57 (H) 14 - 54 U/L   Alkaline Phosphatase 117 38 - 126 U/L   Total Bilirubin 0.5 0.3 - 1.2 mg/dL   GFR calc non Af Amer >60 >60 mL/min   GFR calc Af Amer >60 >60 mL/min    Comment: (NOTE) The eGFR has been calculated using the CKD EPI equation. This calculation has not been validated in all clinical situations. eGFR's persistently <60 mL/min signify possible Chronic Kidney Disease.    Anion gap 13 5 - 15  CBC     Status: None   Collection Time: 11/26/14  4:43 AM  Result Value Ref Range   WBC 7.9 3.6 - 11.0 K/uL   RBC 4.41 3.80 - 5.20 MIL/uL   Hemoglobin 13.1 12.0 - 16.0 g/dL   HCT 38.4 35.0 - 47.0 %   MCV 87.1 80.0 - 100.0 fL   MCH 29.7 26.0 - 34.0 pg   MCHC 34.1 32.0 - 36.0 g/dL   RDW 13.6 11.5 - 14.5 %   Platelets 265 150 - 440 K/uL   US Abdomen Limited Ruq  11/26/2014   CLINICAL DATA:  RIGHT upper quadrant pain for 3 hours. Similar episodes 1 pregnant earlier this year.  EXAM: US ABDOMEN LIMITED - RIGHT UPPER QUADRANT  COMPARISON:  None.  FINDINGS: Gallbladder:  Multiple echogenic gallstones measure up to 7 mm with acoustic shadowing. No gallbladder wall thickening or pericholecystic fluid. No sonographic Murphy's sign elicited.  Common bile duct:  Diameter: 5 mm  Liver:  No focal lesion identified. Within normal limits in parenchymal echogenicity. Hepatopetal portal vein.  IMPRESSION: Cholelithiasis without sonographic findings of acute cholecystitis.   Electronically Signed   By: Elon Alas M.D.   On: 11/26/2014 06:50    Assessment/Plan:  Ultrasound and labs personally reviewed. LFTs are elevated suggesting choledocholithiasis at least in a transient nature.  patient presents with signs of early acute cholecystitis and possible choledocholithiasis Recommend laparoscopic cholecystectomy with cholangiography for her symptoms the options  and rationale have been discussed as well as the risks of bleeding infection recurrence of symptoms failure to resolve her symptoms (procedure bile duct damage bile duct leak retained common bile duct stone any of which could require further surgery and/or ERCP stent and papillotomy have all been reviewed  she understood and agreed to proceed. Will schedule this week. Florene Glen, MD, FACS

## 2014-11-26 NOTE — ED Notes (Signed)
Patient transported to Ultrasound 

## 2014-11-26 NOTE — ED Notes (Signed)
Pt c/o RUQ and epigastric abdominal pain radiating to back. Pt has hx of same pain x 3 while pregnant and post-pregnancy.

## 2014-11-27 ENCOUNTER — Encounter
Admission: RE | Admit: 2014-11-27 | Discharge: 2014-11-27 | Disposition: A | Discharge status: Home / Self Care | Payer: PRIVATE HEALTH INSURANCE | Source: Ambulatory Visit | Attending: Surgery | Admitting: Surgery

## 2014-11-27 ENCOUNTER — Telehealth: Payer: Self-pay | Admitting: Surgery

## 2014-11-27 DIAGNOSIS — Z818 Family history of other mental and behavioral disorders: Secondary | ICD-10-CM | POA: Diagnosis not present

## 2014-11-27 DIAGNOSIS — J302 Other seasonal allergic rhinitis: Secondary | ICD-10-CM | POA: Diagnosis not present

## 2014-11-27 DIAGNOSIS — Z8249 Family history of ischemic heart disease and other diseases of the circulatory system: Secondary | ICD-10-CM | POA: Diagnosis not present

## 2014-11-27 DIAGNOSIS — K8012 Calculus of gallbladder with acute and chronic cholecystitis without obstruction: Secondary | ICD-10-CM | POA: Diagnosis not present

## 2014-11-27 DIAGNOSIS — Z833 Family history of diabetes mellitus: Secondary | ICD-10-CM | POA: Diagnosis not present

## 2014-11-27 DIAGNOSIS — Z881 Allergy status to other antibiotic agents status: Secondary | ICD-10-CM | POA: Diagnosis not present

## 2014-11-27 DIAGNOSIS — Z808 Family history of malignant neoplasm of other organs or systems: Secondary | ICD-10-CM | POA: Diagnosis not present

## 2014-11-27 DIAGNOSIS — Z79899 Other long term (current) drug therapy: Secondary | ICD-10-CM | POA: Diagnosis not present

## 2014-11-27 DIAGNOSIS — K8010 Calculus of gallbladder with chronic cholecystitis without obstruction: Secondary | ICD-10-CM | POA: Diagnosis present

## 2014-11-27 DIAGNOSIS — Z9889 Other specified postprocedural states: Secondary | ICD-10-CM | POA: Diagnosis not present

## 2014-11-27 DIAGNOSIS — Z791 Long term (current) use of non-steroidal anti-inflammatories (NSAID): Secondary | ICD-10-CM | POA: Diagnosis not present

## 2014-11-27 DIAGNOSIS — F329 Major depressive disorder, single episode, unspecified: Secondary | ICD-10-CM | POA: Diagnosis not present

## 2014-11-27 DIAGNOSIS — G4700 Insomnia, unspecified: Secondary | ICD-10-CM | POA: Diagnosis not present

## 2014-11-27 HISTORY — DX: Anemia, unspecified: D64.9

## 2014-11-27 LAB — CBC WITH DIFFERENTIAL/PLATELET
BASOS ABS: 0 10*3/uL (ref 0–0.1)
BASOS PCT: 1 %
Eosinophils Absolute: 0.1 10*3/uL (ref 0–0.7)
Eosinophils Relative: 2 %
HEMATOCRIT: 36.5 % (ref 35.0–47.0)
Hemoglobin: 12 g/dL (ref 12.0–16.0)
Lymphocytes Relative: 39 %
Lymphs Abs: 2 10*3/uL (ref 1.0–3.6)
MCH: 29.1 pg (ref 26.0–34.0)
MCHC: 32.9 g/dL (ref 32.0–36.0)
MCV: 88.5 fL (ref 80.0–100.0)
MONO ABS: 0.4 10*3/uL (ref 0.2–0.9)
Monocytes Relative: 8 %
Neutro Abs: 2.6 10*3/uL (ref 1.4–6.5)
Neutrophils Relative %: 50 %
Platelets: 234 10*3/uL (ref 150–440)
RBC: 4.13 MIL/uL (ref 3.80–5.20)
RDW: 13.6 % (ref 11.5–14.5)
WBC: 5.2 10*3/uL (ref 3.6–11.0)

## 2014-11-27 LAB — COMPREHENSIVE METABOLIC PANEL
ALBUMIN: 4.2 g/dL (ref 3.5–5.0)
ALK PHOS: 140 U/L — AB (ref 38–126)
ALT: 163 U/L — AB (ref 14–54)
AST: 99 U/L — AB (ref 15–41)
Anion gap: 7 (ref 5–15)
BUN: 13 mg/dL (ref 6–20)
CO2: 29 mmol/L (ref 22–32)
Calcium: 9.6 mg/dL (ref 8.9–10.3)
Chloride: 107 mmol/L (ref 101–111)
Creatinine, Ser: 0.74 mg/dL (ref 0.44–1.00)
GFR calc Af Amer: >60 mL/min (ref >60)
GFR calc non Af Amer: >60 mL/min (ref >60)
GLUCOSE: 89 mg/dL (ref 65–99)
Potassium: 3.7 mmol/L (ref 3.5–5.1)
Sodium: 143 mmol/L (ref 135–145)
TOTAL PROTEIN: 7.3 g/dL (ref 6.5–8.1)
Total Bilirubin: 0.4 mg/dL (ref 0.3–1.2)

## 2014-11-27 LAB — LIPASE, BLOOD: Lipase: 24 U/L (ref 22–51)

## 2014-11-27 NOTE — Telephone Encounter (Signed)
Pt advised of pre op date/time and sx date. Sx: 11/28/14 with Dr Perlie Mayo chole with gram Pre op: 11/27/14 @ 7:30am--Office

## 2014-11-27 NOTE — Patient Instructions (Signed)
  Your procedure is scheduled on: November 28, 2014 (Wednesday) Report to Day Surgery. To find out your arrival time please call [phone] between 1PM - 3PM on August 9,2016 (Today).  Remember: Instructions that are not followed completely may result in serious medical risk, up to and including death, or upon the discretion of your surgeon and anesthesiologist your surgery may need to be rescheduled.    _x___ 1. Do not eat food or drink liquids after midnight. No gum chewing or hard candies.     ____ 2. No Alcohol for 24 hours before or after surgery.   ____ 3. Bring all medications with you on the day of surgery if instructed.    _x___ 4. Notify your doctor if there is any change in your medical condition     (cold, fever, infections).     Do not wear jewelry, make-up, hairpins, clips or nail polish.  Do not wear lotions, powders, or perfumes. You may wear deodorant.  Do not shave 48 hours prior to surgery. Men may shave face and neck.  Do not bring valuables to the hospital.    [facility] is not responsible for any belongings or valuables.               Contacts, dentures or bridgework may not be worn into surgery.  Leave your suitcase in the car. After surgery it may be brought to your room.  For patients admitted to the hospital, discharge time is determined by your                treatment team.   Patients discharged the day of surgery will not be allowed to drive home.   Please read over the following fact sheets that you were given:   Surgical Site Infection Prevention   __x__ Take these medicines the morning of surgery with A SIP OF WATER:    1. Ranitidine (Ranitidine at bedtime tonight)  2.   3.   4.  5.  6.  ____ Fleet Enema (as directed)   _x___ Use CHG Soap as directed  ____ Use inhalers on the day of surgery  ____ Stop metformin 2 days prior to surgery    ____ Take 1/2 of usual insulin dose the night before surgery and none on the morning of surgery.    ____ Stop Coumadin/Plavix/aspirin on   _x___ Stop Anti-inflammatories on (STOP IBUPROFEN NOW)   ____ Stop supplements until after surgery.    ____ Bring C-Pap to the hospital.

## 2014-11-28 ENCOUNTER — Ambulatory Visit: Payer: PRIVATE HEALTH INSURANCE | Admitting: Certified Registered Nurse Anesthetist

## 2014-11-28 ENCOUNTER — Ambulatory Visit
Admission: RE | Admit: 2014-11-28 | Discharge: 2014-11-28 | Disposition: A | Discharge status: Home / Self Care | Payer: PRIVATE HEALTH INSURANCE | Source: Ambulatory Visit | Attending: Surgery | Admitting: Surgery

## 2014-11-28 ENCOUNTER — Encounter
Admission: RE | Disposition: A | Discharge status: Home / Self Care | Payer: Self-pay | Source: Ambulatory Visit | Attending: Surgery

## 2014-11-28 ENCOUNTER — Ambulatory Visit: Payer: PRIVATE HEALTH INSURANCE

## 2014-11-28 ENCOUNTER — Encounter: Payer: Self-pay | Admitting: *Deleted

## 2014-11-28 ENCOUNTER — Ambulatory Visit: Payer: PRIVATE HEALTH INSURANCE | Admitting: Obstetrics & Gynecology

## 2014-11-28 DIAGNOSIS — Z881 Allergy status to other antibiotic agents status: Secondary | ICD-10-CM | POA: Insufficient documentation

## 2014-11-28 DIAGNOSIS — K819 Cholecystitis, unspecified: Secondary | ICD-10-CM | POA: Diagnosis not present

## 2014-11-28 DIAGNOSIS — K8012 Calculus of gallbladder with acute and chronic cholecystitis without obstruction: Secondary | ICD-10-CM | POA: Insufficient documentation

## 2014-11-28 DIAGNOSIS — J302 Other seasonal allergic rhinitis: Secondary | ICD-10-CM | POA: Insufficient documentation

## 2014-11-28 DIAGNOSIS — F329 Major depressive disorder, single episode, unspecified: Secondary | ICD-10-CM | POA: Insufficient documentation

## 2014-11-28 DIAGNOSIS — Z791 Long term (current) use of non-steroidal anti-inflammatories (NSAID): Secondary | ICD-10-CM | POA: Insufficient documentation

## 2014-11-28 DIAGNOSIS — Z79899 Other long term (current) drug therapy: Secondary | ICD-10-CM | POA: Insufficient documentation

## 2014-11-28 DIAGNOSIS — Z9889 Other specified postprocedural states: Secondary | ICD-10-CM | POA: Insufficient documentation

## 2014-11-28 DIAGNOSIS — Z818 Family history of other mental and behavioral disorders: Secondary | ICD-10-CM | POA: Insufficient documentation

## 2014-11-28 DIAGNOSIS — Z833 Family history of diabetes mellitus: Secondary | ICD-10-CM | POA: Insufficient documentation

## 2014-11-28 DIAGNOSIS — Z8249 Family history of ischemic heart disease and other diseases of the circulatory system: Secondary | ICD-10-CM | POA: Insufficient documentation

## 2014-11-28 DIAGNOSIS — Z808 Family history of malignant neoplasm of other organs or systems: Secondary | ICD-10-CM | POA: Insufficient documentation

## 2014-11-28 DIAGNOSIS — G4700 Insomnia, unspecified: Secondary | ICD-10-CM | POA: Insufficient documentation

## 2014-11-28 HISTORY — PX: CHOLECYSTECTOMY: SHX55

## 2014-11-28 LAB — POCT PREGNANCY, URINE: Preg Test, Ur: NEGATIVE

## 2014-11-28 SURGERY — LAPAROSCOPIC CHOLECYSTECTOMY WITH INTRAOPERATIVE CHOLANGIOGRAM
Anesthesia: General | Wound class: Clean Contaminated

## 2014-11-28 MED ORDER — NEOSTIGMINE METHYLSULFATE 10 MG/10ML IV SOLN
INTRAVENOUS | Status: DC | PRN
  Administered 2014-11-28: 4 mg via INTRAVENOUS

## 2014-11-28 MED ORDER — HYDROCODONE-ACETAMINOPHEN 5-325 MG PO TABS
ORAL_TABLET | ORAL | Status: AC
  Filled 2014-11-28: qty 1

## 2014-11-28 MED ORDER — ACETAMINOPHEN 10 MG/ML IV SOLN
INTRAVENOUS | Status: DC | PRN
  Administered 2014-11-28: 1000 mg via INTRAVENOUS

## 2014-11-28 MED ORDER — LIDOCAINE HCL (CARDIAC) 20 MG/ML IV SOLN
INTRAVENOUS | Status: DC | PRN
  Administered 2014-11-28: 60 mg via INTRAVENOUS

## 2014-11-28 MED ORDER — HYDROCODONE-ACETAMINOPHEN 5-325 MG PO TABS
1.0000 | ORAL_TABLET | ORAL | Status: DC | PRN
  Administered 2014-11-28: 1 via ORAL

## 2014-11-28 MED ORDER — MIDAZOLAM HCL 2 MG/2ML IJ SOLN
INTRAMUSCULAR | Status: DC | PRN
  Administered 2014-11-28: 2 mg via INTRAVENOUS

## 2014-11-28 MED ORDER — LACTATED RINGERS IV SOLN
INTRAVENOUS | Status: DC
  Administered 2014-11-28 (×2): via INTRAVENOUS

## 2014-11-28 MED ORDER — SUCCINYLCHOLINE CHLORIDE 20 MG/ML IJ SOLN
INTRAMUSCULAR | Status: DC | PRN
  Administered 2014-11-28: 100 mg via INTRAVENOUS
  Administered 2014-11-28: 30 mg via INTRAVENOUS

## 2014-11-28 MED ORDER — HEPARIN SODIUM (PORCINE) 5000 UNIT/ML IJ SOLN
5000.0000 [IU] | Freq: Once | INTRAMUSCULAR | Status: AC
  Administered 2014-11-28: 5000 [IU] via SUBCUTANEOUS

## 2014-11-28 MED ORDER — HYDROCODONE-ACETAMINOPHEN 5-325 MG PO TABS: 1.0000 | ORAL_TABLET | ORAL | Status: DC | PRN

## 2014-11-28 MED ORDER — ACETAMINOPHEN 10 MG/ML IV SOLN
INTRAVENOUS | Status: AC
  Filled 2014-11-28: qty 100

## 2014-11-28 MED ORDER — BUPIVACAINE-EPINEPHRINE (PF) 0.25% -1:200000 IJ SOLN
INTRAMUSCULAR | Status: DC | PRN
  Administered 2014-11-28: 30 mL

## 2014-11-28 MED ORDER — FENTANYL CITRATE (PF) 100 MCG/2ML IJ SOLN
INTRAMUSCULAR | Status: AC
  Administered 2014-11-28: 25 ug via INTRAVENOUS
  Filled 2014-11-28: qty 2

## 2014-11-28 MED ORDER — FENTANYL CITRATE (PF) 100 MCG/2ML IJ SOLN
25.0000 ug | INTRAMUSCULAR | Status: AC | PRN
  Administered 2014-11-28 (×6): 25 ug via INTRAVENOUS

## 2014-11-28 MED ORDER — CEFAZOLIN SODIUM-DEXTROSE 2-3 GM-% IV SOLR
INTRAVENOUS | Status: AC
  Administered 2014-11-28: 2 g via INTRAVENOUS
  Filled 2014-11-28: qty 50

## 2014-11-28 MED ORDER — HEPARIN SODIUM (PORCINE) 5000 UNIT/ML IJ SOLN
INTRAMUSCULAR | Status: AC
  Administered 2014-11-28: 5000 [IU] via SUBCUTANEOUS
  Filled 2014-11-28: qty 1

## 2014-11-28 MED ORDER — GLYCOPYRROLATE 0.2 MG/ML IJ SOLN
INTRAMUSCULAR | Status: DC | PRN
  Administered 2014-11-28: 0.6 mg via INTRAVENOUS
  Administered 2014-11-28: 0.2 mg via INTRAVENOUS

## 2014-11-28 MED ORDER — ONDANSETRON HCL 4 MG/2ML IJ SOLN: 4.0000 mg | Freq: Once | INTRAMUSCULAR | Status: DC | PRN

## 2014-11-28 MED ORDER — DEXAMETHASONE SODIUM PHOSPHATE 4 MG/ML IJ SOLN
INTRAMUSCULAR | Status: DC | PRN
  Administered 2014-11-28: 8 mg via INTRAVENOUS

## 2014-11-28 MED ORDER — CEFAZOLIN SODIUM-DEXTROSE 2-3 GM-% IV SOLR
2.0000 g | INTRAVENOUS | Status: AC
  Administered 2014-11-28: 2 g via INTRAVENOUS

## 2014-11-28 MED ORDER — FENTANYL CITRATE (PF) 100 MCG/2ML IJ SOLN
INTRAMUSCULAR | Status: DC | PRN
  Administered 2014-11-28: 100 ug via INTRAVENOUS
  Administered 2014-11-28 (×3): 50 ug via INTRAVENOUS

## 2014-11-28 MED ORDER — BUPIVACAINE-EPINEPHRINE (PF) 0.25% -1:200000 IJ SOLN
INTRAMUSCULAR | Status: AC
  Filled 2014-11-28: qty 30

## 2014-11-28 MED ORDER — IOTHALAMATE MEGLUMINE 60 % INJ SOLN
INTRAMUSCULAR | Status: DC | PRN
  Administered 2014-11-28: 10 mL

## 2014-11-28 MED ORDER — PROPOFOL 10 MG/ML IV BOLUS
INTRAVENOUS | Status: DC | PRN
Start: 2014-11-28 — End: 2014-11-28
  Administered 2014-11-28: 160 mg via INTRAVENOUS

## 2014-11-28 MED ORDER — ONDANSETRON HCL 4 MG/2ML IJ SOLN
INTRAMUSCULAR | Status: DC | PRN
  Administered 2014-11-28: 4 mg via INTRAVENOUS

## 2014-11-28 SURGICAL SUPPLY — 41 items
ADHESIVE MASTISOL STRL (MISCELLANEOUS) ×2 IMPLANT
APPLIER CLIP ROT 10 11.4 M/L (STAPLE) ×2
BLADE SURG SZ11 CARB STEEL (BLADE) ×2 IMPLANT
CANISTER SUCT 1200ML W/VALVE (MISCELLANEOUS) ×2 IMPLANT
CATH CHOLANGI 4FR 420404F (CATHETERS) ×2 IMPLANT
CHLORAPREP W/TINT 26ML (MISCELLANEOUS) ×2 IMPLANT
CLIP APPLIE ROT 10 11.4 M/L (STAPLE) ×1 IMPLANT
CONRAY 60ML FOR OR (MISCELLANEOUS) ×2 IMPLANT
DRAPE C-ARM XRAY 36X54 (DRAPES) ×2 IMPLANT
ENDOPOUCH RETRIEVER 10 (MISCELLANEOUS) ×2 IMPLANT
GAUZE SPONGE NON-WVN 2X2 STRL (MISCELLANEOUS) ×4 IMPLANT
GLOVE BIO SURGEON STRL SZ8 (GLOVE) ×10 IMPLANT
GOWN STRL REUS W/ TWL LRG LVL3 (GOWN DISPOSABLE) ×3 IMPLANT
GOWN STRL REUS W/TWL LRG LVL3 (GOWN DISPOSABLE) ×3
IRRIGATION STRYKERFLOW (MISCELLANEOUS) ×1 IMPLANT
IRRIGATOR STRYKERFLOW (MISCELLANEOUS) ×2
IV CATH ANGIO 12GX3 LT BLUE (NEEDLE) ×2 IMPLANT
IV NS 1000ML (IV SOLUTION) ×1
IV NS 1000ML BAXH (IV SOLUTION) ×1 IMPLANT
JACKSON PRATT 10 (INSTRUMENTS) IMPLANT
KIT RM TURNOVER STRD PROC AR (KITS) ×2 IMPLANT
LABEL OR SOLS (LABEL) ×2 IMPLANT
NDL SAFETY 22GX1.5 (NEEDLE) ×2 IMPLANT
NEEDLE VERESS 14GA 120MM (NEEDLE) ×2 IMPLANT
NS IRRIG 500ML POUR BTL (IV SOLUTION) ×2 IMPLANT
PACK LAP CHOLECYSTECTOMY (MISCELLANEOUS) ×2 IMPLANT
PAD GROUND ADULT SPLIT (MISCELLANEOUS) ×2 IMPLANT
SCISSORS METZENBAUM CVD 33 (INSTRUMENTS) ×2 IMPLANT
SLEEVE ENDOPATH XCEL 5M (ENDOMECHANICALS) ×2 IMPLANT
SPONGE EXCIL AMD DRAIN 4X4 6P (MISCELLANEOUS) IMPLANT
SPONGE LAP 18X18 5 PK (GAUZE/BANDAGES/DRESSINGS) ×2 IMPLANT
SPONGE VERSALON 2X2 STRL (MISCELLANEOUS) ×4
STRIP CLOSURE SKIN 1/2X4 (GAUZE/BANDAGES/DRESSINGS) ×2 IMPLANT
SUT MNCRL 4-0 (SUTURE) ×1
SUT MNCRL 4-0 27XMFL (SUTURE) ×1
SUT VICRYL 0 AB UR-6 (SUTURE) ×2 IMPLANT
SUTURE MNCRL 4-0 27XMF (SUTURE) ×1 IMPLANT
SYR 20CC LL (SYRINGE) ×2 IMPLANT
TROCAR XCEL NON-BLD 11X100MML (ENDOMECHANICALS) ×2 IMPLANT
TROCAR XCEL NON-BLD 5MMX100MML (ENDOMECHANICALS) ×4 IMPLANT
TUBING INSUFFLATOR HI FLOW (MISCELLANEOUS) ×2 IMPLANT

## 2014-11-28 NOTE — Progress Notes (Signed)
   11/28/14 1000  Clinical Encounter Type  Visited With Patient and family together  Visit Type Spiritual support  Spiritual Encounters  Spiritual Needs Prayer   Status: good spirits, female 71, Supervision of normal pregnancy Family: Husband and Mother bedside Visit Assessment: The Chaplain introduced Pastoral Care and the patient agreed as well as her family to assist with signature witnessing for another patient's Adv Dir. The patient and her family and the pregnancy will be lifted up in prayer.  Pastoral Care: [phone] and online request

## 2014-11-28 NOTE — Anesthesia Preprocedure Evaluation (Signed)
Anesthesia Evaluation  Patient identified by MRN, date of birth, ID band Patient awake    Reviewed: Allergy & Precautions, NPO status , Patient's Chart, lab work & pertinent test results  History of Anesthesia Complications Negative for: history of anesthetic complications  Airway Mallampati: II  TM Distance: >3 FB Neck ROM: Full    Dental  (+) Teeth Intact   Pulmonary          Cardiovascular     Neuro/Psych Anxiety Depression    GI/Hepatic GERD-  Medicated and Poorly Controlled,  Endo/Other    Renal/GU      Musculoskeletal   Abdominal   Peds  Hematology  (+) Blood dyscrasia (iron deficiency), anemia ,   Anesthesia Other Findings   Reproductive/Obstetrics                             Anesthesia Physical Anesthesia Plan  ASA: II  Anesthesia Plan: General   Post-op Pain Management:    Induction: Intravenous  Airway Management Planned: Oral ETT  Additional Equipment:   Intra-op Plan:   Post-operative Plan:   Informed Consent: I have reviewed the patients History and Physical, chart, labs and discussed the procedure including the risks, benefits and alternatives for the proposed anesthesia with the patient or authorized representative who has indicated his/her understanding and acceptance.     Plan Discussed with:   Anesthesia Plan Comments:         Anesthesia Quick Evaluation

## 2014-11-28 NOTE — Anesthesia Postprocedure Evaluation (Signed)
  Anesthesia Post-op Note  Patient: Loretta Burton  Procedure(s) Performed: Procedure(s): LAPAROSCOPIC CHOLECYSTECTOMY WITH INTRAOPERATIVE CHOLANGIOGRAM (N/A)  Anesthesia type:General  Patient location: PACU  Post pain: Pain level controlled  Post assessment: Post-op Vital signs reviewed, Patient's Cardiovascular Status Stable, Respiratory Function Stable, Patent Airway and No signs of Nausea or vomiting  Post vital signs: Reviewed and stable  Last Vitals:  Filed Vitals:   11/28/14 1210  BP: 109/56  Pulse: 58  Temp:   Resp: 20    Level of consciousness: awake, alert  and patient cooperative  Complications: No apparent anesthesia complications

## 2014-11-28 NOTE — Discharge Instructions (Addendum)
Remove dressing in 24 hours. May shower in 24 hours. Leave paper strips in place. Resume all home medications. Follow-up with Dr. Excell Seltzer in 10 days                                                                                                                                               AMBULATORY SURGERY  DISCHARGE INSTRUCTIONS   1) The drugs that you were given will stay in your system until tomorrow so for the next 24 hours you should not:  A) Drive an automobile B) Make any legal decisions C) Drink any alcoholic beverage   2) You may resume regular meals tomorrow.  Today it is better to start with liquids and gradually work up to solid foods.  You may eat anything you prefer, but it is better to start with liquids, then soup and crackers, and gradually work up to solid foods.   3) Please notify your doctor immediately if you have any unusual bleeding, trouble breathing, redness and pain at the surgery site, drainage, fever, or pain not relieved by medication.

## 2014-11-28 NOTE — Op Note (Signed)
Laparoscopic Cholecystectomy  Pre-operative Diagnosis: acute cholecystitis  Post-operative Diagnosis: same  Procedure: laparascopic cholecystectomy with cholangiograms  Surgeon: Adah Salvage. Excell Seltzer, MD FACS  Anesthesia: Gen. with endotracheal tube   Procedure Details  The patient was seen again in the Holding Room. The benefits, complications, treatment options, and expected outcomes were discussed with the patient. The risks of bleeding, infection, recurrence of symptoms, failure to resolve symptoms, bile duct damage, bile duct leak, retained common bile duct stone, bowel injury, any of which could require further surgery and/or ERCP, stent, or papillotomy were reviewed with the patient. The likelihood of improving the patient's symptoms with return to their baseline status is good.  The patient and/or family concurred with the proposed plan, giving informed consent.  The patient was taken to Operating Room, identified as Loretta Burton and the procedure verified as Laparoscopic Cholecystectomy.  A Time Out was held and the above information confirmed.  Prior to the induction of general anesthesia, antibiotic prophylaxis was administered. VTE prophylaxis was in place. General endotracheal anesthesia was then administered and tolerated well. After the induction, the abdomen was prepped with Chloraprep and draped in the sterile fashion. The patient was positioned in the supine position.  Local anesthetic  was injected into the skin near the umbilicus and an incision made. The Veress needle was placed. Pneumoperitoneum was then created with CO2 and tolerated well without any adverse changes in the patient's vital signs. A 5mm port was placed in the periumbilical position and the abdominal cavity was explored.  Two 5-mm ports were placed in the right upper quadrant and a 12 mm epigastric port was placed all under direct vision. All skin incisions  were infiltrated with a local anesthetic agent  before making the incision and placing the trocars.   The patient was positioned  in reverse Trendelenburg, tilted slightly to the patient's left.  The gallbladder was identified, the fundus grasped and retracted cephalad. Adhesions were lysed bluntly. The infundibulum was grasped and retracted laterally, exposing the peritoneum overlying the triangle of Calot. This was then divided and exposed in a blunt fashion. A critical view of the cystic duct and cystic artery was obtained.  The cystic duct was clearly identified and bluntly dissected.   The cystic artery was well identified the cystic duct was clipped and incised and a cystic duct stone was removed visually. Through a separate incision and Angiocath a cholangiogram catheter was placed. C-arm fluoroscopic cholangiography demonstrated good flow in the duodenum without intraluminal filling defects the cystic duct had been cannulated in the proximal ducts were well identified. The cholangiocatheter was removed and the duct was doubly clipped and divided the cystic artery was doubly clipped and divided with the cystic lymph node.  The gallbladder was taken from the gallbladder fossa in a retrograde fashion with the electrocautery. The gallbladder was removed and placed in an Endocatch bag. The liver bed was irrigated and inspected. Hemostasis was achieved with the electrocautery. Copious irrigation was utilized and was repeatedly aspirated until clear.  The gallbladder and Endocatch sac were then removed through the epigastric port site.   Inspection of the right upper quadrant was performed. No bleeding, bile duct injury or leak, or bowel injury was noted. Pneumoperitoneum was released.  The epigastric port site was closed with figure-of-eight 0 Vicryl sutures. 4-0 subcuticular Monocryl was used to close the skin. Steristrips and Mastisol and sterile dressings were  applied.  The patient was then extubated and brought to the recovery room in stable  condition. Sponge, lap, and needle counts were correct at closure and at the conclusion of the case.   Findings: Acute Cholecystitis   Estimated Blood Loss: 20 cc         Drains: None         Specimens: Gallbladder           Complications: none               Loretta Burton E. Excell Seltzer, MD, FACS

## 2014-11-28 NOTE — Anesthesia Procedure Notes (Signed)
Procedure Name: Intubation Performed by: Malva Cogan Pre-anesthesia Checklist: Patient identified, Emergency Drugs available, Patient being monitored, Suction available and Timeout performed Patient Re-evaluated:Patient Re-evaluated prior to inductionOxygen Delivery Method: Circle system utilized Preoxygenation: Pre-oxygenation with 100% oxygen Intubation Type: IV induction, Cricoid Pressure applied and Rapid sequence

## 2014-11-28 NOTE — Transfer of Care (Signed)
Immediate Anesthesia Transfer of Care Note  Patient: Loretta Burton  Procedure(s) Performed: Procedure(s): LAPAROSCOPIC CHOLECYSTECTOMY WITH INTRAOPERATIVE CHOLANGIOGRAM (N/A)  Patient Location: PACU  Anesthesia Type:General  Level of Consciousness: awake, alert  and oriented  Airway & Oxygen Therapy: Patient Spontanous Breathing and Patient connected to face mask oxygen  Post-op Assessment: Report given to RN and Post -op Vital signs reviewed and stable  Post vital signs: Reviewed and stable  Last Vitals:  Filed Vitals:   11/28/14 1200  BP: 123/59  Pulse: 76  Temp:   Resp: 20   Temp 99.2 Complications: No apparent anesthesia complications

## 2014-11-28 NOTE — Progress Notes (Signed)
Preoperative Review   Patient is met in the preoperative holding area. The history is reviewed in the chart and with the patient. I personally reviewed the options and rationale as well as the risks of this procedure that have been previously discussed with the patient. All questions asked by the patient and/or family were answered to their satisfaction.  Discussed elevated LFT and poss retained stone, ERCP, open CBD exploration. Additional risks reviewed in detail.   Patient agrees to proceed with this procedure at this time.  Florene Glen M.D. FACS

## 2014-11-29 LAB — SURGICAL PATHOLOGY

## 2014-12-04 ENCOUNTER — Encounter: Payer: Self-pay | Admitting: *Deleted

## 2014-12-10 ENCOUNTER — Encounter: Payer: Self-pay | Admitting: Surgery

## 2014-12-10 ENCOUNTER — Ambulatory Visit (INDEPENDENT_AMBULATORY_CARE_PROVIDER_SITE_OTHER): Payer: 59 | Admitting: Surgery

## 2014-12-10 VITALS — BP 134/66 | HR 74 | Temp 98.2°F | Wt 194.0 lb

## 2014-12-10 DIAGNOSIS — K819 Cholecystitis, unspecified: Secondary | ICD-10-CM

## 2014-12-10 NOTE — Patient Instructions (Signed)
Please call our office with any questions or concerns.  Advance your activity as tolerated.

## 2014-12-10 NOTE — Progress Notes (Signed)
[facility]  12 days status post urgent laparoscopic cholecystectomy for acute on chronic calculus cholecystitis. She is 8 weeks postpartum. She is accompanied by her significant other. She's had no further pain no nausea no vomiting no change in bowel habits no jaundice. Today's her first postoperative visit.  PE:  The patient is abdomen is soft and nontender incisions healed nicely. There is no drainage or ecchymosis present.  Pathology was reviewed with the patient and her significant other.  Impression doing well status post laparoscopic cholecystectomy for acute on chronic calculus cholecystitis.  Plan:  Activity and diet as tolerated we will see her back in the office as needed,    no pain medications were requested.

## 2014-12-18 ENCOUNTER — Ambulatory Visit: Payer: PRIVATE HEALTH INSURANCE | Admitting: Obstetrics & Gynecology

## 2014-12-20 ENCOUNTER — Encounter: Payer: Self-pay | Admitting: Obstetrics and Gynecology

## 2014-12-20 ENCOUNTER — Ambulatory Visit (INDEPENDENT_AMBULATORY_CARE_PROVIDER_SITE_OTHER): Payer: PRIVATE HEALTH INSURANCE | Admitting: Obstetrics and Gynecology

## 2014-12-20 DIAGNOSIS — Z30011 Encounter for initial prescription of contraceptive pills: Secondary | ICD-10-CM

## 2014-12-20 MED ORDER — NORETHIN ACE-ETH ESTRAD-FE 1-20 MG-MCG(24) PO TABS: 1.0000 | ORAL_TABLET | Freq: Every day | ORAL | Status: DC

## 2014-12-20 NOTE — Progress Notes (Signed)
  Subjective:     Loretta Burton is a 29 y.o. female who presents for a postpartum visit. She is 8 weeks postpartum following a spontaneous vaginal delivery. I have fully reviewed the prenatal and intrapartum course. The delivery was at 39.6 gestational weeks. Outcome: spontaneous vaginal delivery. Anesthesia: epidural. Postpartum course has been complicated by cholecystitis requiring a cholecystectomy. Baby's course has been uncomplicated. Baby is feeding by bottle - Similac Advance. Bleeding normal menses. Bowel function is normal. Bladder function is normal. Patient is sexually active. Contraception method is condoms. Postpartum depression screening: negative.     Review of Systems Pertinent items are noted in HPI.   Objective:    BP 109/72 mmHg  Pulse 82  Resp 18  Ht  (1.626 m)  Wt 197 lb (89.359 kg)  BMI 33.80 kg/m2  LMP 12/18/2014  Breastfeeding? No  General:  alert, cooperative and no distress   Breasts:  inspection negative, no nipple discharge or bleeding, no masses or nodularity palpable  Lungs: clear to auscultation bilaterally  Heart:  regular rate and rhythm  Abdomen: soft, non-tender; bowel sounds normal; no masses,  no organomegaly   Vulva:  normal  Vagina: normal vagina, no discharge, exudate, lesion, or erythema  Cervix:  multiparous appearance and no lesions  Corpus: normal size, contour, position, consistency, mobility, non-tender  Adnexa:  normal adnexa and no mass, fullness, tenderness  Rectal Exam: Not performed.        Assessment:     Normal postpartum exam. Pap smear not done at today's visit.   Plan:    1. Contraception: OCP (estrogen/progesterone) Rx Loestrin provided as she has used it in the past 2. Patient is medically cleared to resume all activities of daily living 3. Follow up in November for annual exam or as needed.

## 2014-12-20 NOTE — Progress Notes (Signed)
Pt here for Postpartum check, had Cholecystectomy on 11-28-14.  No longer breastfeeding, would like rx for birth control pill.

## 2015-01-08 ENCOUNTER — Encounter: Payer: Self-pay | Admitting: *Deleted

## 2015-01-08 ENCOUNTER — Telehealth: Payer: Self-pay | Admitting: *Deleted

## 2015-01-08 NOTE — Telephone Encounter (Signed)
Pt left voice mail stating she returned to work on 12-24-2014 and needed a note sent to her work for her release back to normal duties.  Letter faxed to Beckie Salts per patient request.

## 2015-01-09 ENCOUNTER — Encounter: Payer: Self-pay | Admitting: *Deleted

## 2015-11-11 ENCOUNTER — Telehealth: Payer: Self-pay | Admitting: *Deleted

## 2015-11-11 MED ORDER — NORETHIN ACE-ETH ESTRAD-FE 1-20 MG-MCG(24) PO TABS: 1.0000 | ORAL_TABLET | Freq: Every day | ORAL | 11 refills | Status: DC

## 2015-11-11 NOTE — Telephone Encounter (Signed)
-----   Message from Olevia Bowens sent at 11/08/2015 10:20 AM EDT ----- Regarding: Refill Request Contact: [phone] Needs refill on OCP Uses [facility] on [address] in [place]

## 2015-11-11 NOTE — Telephone Encounter (Signed)
Sent meds to pharmacy as requested

## 2015-11-21 ENCOUNTER — Encounter: Payer: Self-pay | Admitting: Primary Care

## 2015-11-21 ENCOUNTER — Ambulatory Visit (INDEPENDENT_AMBULATORY_CARE_PROVIDER_SITE_OTHER): Payer: Managed Care, Other (non HMO) | Admitting: Primary Care

## 2015-11-21 DIAGNOSIS — J329 Chronic sinusitis, unspecified: Secondary | ICD-10-CM | POA: Diagnosis not present

## 2015-11-21 DIAGNOSIS — F413 Other mixed anxiety disorders: Secondary | ICD-10-CM

## 2015-11-21 DIAGNOSIS — G4700 Insomnia, unspecified: Secondary | ICD-10-CM | POA: Diagnosis not present

## 2015-11-21 NOTE — Patient Instructions (Signed)
Continue Melatonin 10 mg at bedtime for insomnia. Please notify me if this becomes ineffective.  Continue Zyrtec 10 mg once daily.  It was a pleasure to meet you today! Please don't hesitate to call me with any questions. Welcome to [facility]!

## 2015-11-21 NOTE — Assessment & Plan Note (Signed)
Chronic for years, requiring antibiotics 4-6 times annually. Now managed on daily Zyrtec and Flonase PRN which has decreased the frequency of her infections. Exam today not suspicious for bacterial involvement.

## 2015-11-21 NOTE — Progress Notes (Signed)
Subjective:    Patient ID: Loretta Burton, female    DOB: 10-Jan-1986, 30 y.o.   MRN: [id number]  HPI  Loretta Burton is a 30 year old female who presents today to establish care and discuss the problems mentioned below. Will obtain old records.  1) Anxiety and Depression: Diagnosed 3-4 years ago. She was once managed on Lexapro 10 mg, Xanax XR 1 mg BID, and Trazodone 75 mg at bedtime. Overall she feels well managed off of her medications for which she has not taken in 2 years. Denies SI/HI and panic attacks.  2) Chronic Sinusitis: Long history of recurrent sinus infections since childhood. She takes Zyrtec and Mucinex D as needed with improvement. She also uses Flonase as needed. No prior evaluation per ENT. Sinus infections requiring antibiotics are 4-6 per year, although this year has only required antibiotics twice. She has noticed sinus pressure with nasal congestion over the past 3 days. Denies fevers, chills, sore throat.  3) Seasonal Allergies: Currently managed on Zyrtec 10 mg once daily. Feels well managed since she started taking this daily.  4) Insomnia: Long history of. Previously managed on Trazodone, but now managed on Melatonin OTC with improvement. She has difficulty falling and staying asleep. Overall she feels well managed on Melatonin.  Review of Systems  Constitutional: Negative for chills, fatigue and fever.  HENT: Positive for congestion and sinus pressure. Negative for sore throat.   Respiratory: Negative for shortness of breath.   Allergic/Immunologic: Positive for environmental allergies.  Psychiatric/Behavioral: Negative for sleep disturbance and suicidal ideas. The patient is not nervous/anxious.        Past Medical History:  Diagnosis Date  . Allergy    seasonal  . Anemia   . Cholelithiasis   . Depression   . GERD (gastroesophageal reflux disease)   . Insomnia   . Panic attack      Social History   Social History  . Marital status: Married   Spouse name: N/A  . Number of children: N/A  . Years of education: N/A   Occupational History  . Not on file.   Social History Main Topics  . Smoking status: Never Smoker  . Smokeless tobacco: Never Used  . Alcohol use No  . Drug use: No  . Sexual activity: Yes    Partners: Male    Birth control/ protection: None, Condom   Other Topics Concern  . Not on file   Social History Narrative   Married.   1 daughter.   Works at [employer] as a Lawyer.   Enjoys crafting, spending time with her daughter    Past Surgical History:  Procedure Laterality Date  . CHOLECYSTECTOMY N/A 11/28/2014   Procedure: LAPAROSCOPIC CHOLECYSTECTOMY WITH INTRAOPERATIVE CHOLANGIOGRAM;  Surgeon: Lattie Haw, MD;  Location: [facility] ORS;  Service: General;  Laterality: N/A;  . WISDOM TOOTH EXTRACTION     x4    Family History  Problem Relation Age of Onset  . Diabetes Mother   . Hypertension Mother   . Depression Mother   . Diabetes Maternal Grandmother   . Hypertension Maternal Grandmother   . Heart disease Maternal Grandmother   . Cancer Maternal Grandmother     skin  . Diabetes Paternal Grandmother   . Hypertension Paternal Grandmother   . Heart disease Paternal Grandmother   . Diabetes Paternal Grandfather   . Hypertension Paternal Grandfather   . Heart disease Paternal Grandfather     Allergies  Allergen Reactions  .  Adhesive [Tape] Itching  . Ciprofloxacin Hives and Nausea And Vomiting    Current Outpatient Prescriptions on File Prior to Visit  Medication Sig Dispense Refill  . Norethindrone Acetate-Ethinyl Estrad-FE (LOESTRIN 24 FE) 1-20 MG-MCG(24) tablet Take 1 tablet by mouth daily. 1 Package 11   No current facility-administered medications on file prior to visit.     BP 124/72   Pulse 78   Temp 98 F (36.7 C) (Oral)   Ht 5\' 4"  (1.626 m)   Wt 213 lb (96.6 kg)   LMP 11/19/2015   SpO2 97%   BMI 36.56 kg/m    Objective:   Physical Exam  Constitutional: She  appears well-nourished.  HENT:  Right Ear: Tympanic membrane and ear canal normal.  Left Ear: Tympanic membrane and ear canal normal.  Nose: Right sinus exhibits no maxillary sinus tenderness and no frontal sinus tenderness. Left sinus exhibits no maxillary sinus tenderness and no frontal sinus tenderness.  Mouth/Throat: Oropharynx is clear and moist.  Eyes: Conjunctivae are normal.  Neck: Neck supple.  Cardiovascular: Normal rate and regular rhythm.   Pulmonary/Chest: Effort normal and breath sounds normal. She has no wheezes. She has no rales.  Lymphadenopathy:    She has no cervical adenopathy.  Skin: Skin is warm and dry.  Psychiatric: She has a normal mood and affect.          Assessment & Plan:

## 2015-11-21 NOTE — Progress Notes (Signed)
Pre visit review using our clinic review tool, if applicable. No additional management support is needed unless otherwise documented below in the visit note. 

## 2015-11-21 NOTE — Assessment & Plan Note (Signed)
Long history of. Previously managed on Trazodone. Now taking Melatonin as she prefers not to take prescription medication. Feels well managed on Melatonin.

## 2015-11-21 NOTE — Assessment & Plan Note (Signed)
Once managed on Lexapro 10 mg, Xanax XR 1 mg BID, and Trazodone. Off meds for the past 2 years, feels well off meds. No panic attacks.

## 2015-11-25 ENCOUNTER — Ambulatory Visit: Payer: PRIVATE HEALTH INSURANCE | Admitting: Obstetrics & Gynecology

## 2015-11-28 ENCOUNTER — Ambulatory Visit (INDEPENDENT_AMBULATORY_CARE_PROVIDER_SITE_OTHER): Payer: Managed Care, Other (non HMO) | Admitting: Advanced Practice Midwife

## 2015-11-28 ENCOUNTER — Encounter: Payer: Self-pay | Admitting: Advanced Practice Midwife

## 2015-11-28 VITALS — BP 132/79 | HR 111 | Resp 20 | Ht 64.0 in | Wt 213.0 lb

## 2015-11-28 DIAGNOSIS — Z3009 Encounter for other general counseling and advice on contraception: Secondary | ICD-10-CM

## 2015-11-28 DIAGNOSIS — Z30011 Encounter for initial prescription of contraceptive pills: Secondary | ICD-10-CM | POA: Diagnosis not present

## 2015-11-28 MED ORDER — NORETHIN ACE-ETH ESTRAD-FE 1-20 MG-MCG(24) PO TABS: 1.0000 | ORAL_TABLET | Freq: Every day | ORAL | 13 refills | Status: DC

## 2015-11-28 NOTE — Patient Instructions (Signed)
Breast Self-Awareness Practicing breast self-awareness may pick up problems early, prevent significant medical complications, and possibly save your life. By practicing breast self-awareness, you can become familiar with how your breasts look and feel and if your breasts are changing. This allows you to notice changes early. It can also offer you some reassurance that your breast health is good. One way to learn what is normal for your breasts and whether your breasts are changing is to do a breast self-exam. If you find a lump or something that was not present in the past, it is best to contact your caregiver right away. Other findings that should be evaluated by your caregiver include nipple discharge, especially if it is bloody; skin changes or reddening; areas where the skin seems to be pulled in (retracted); or new lumps and bumps. Breast pain is seldom associated with cancer (malignancy), but should also be evaluated by a caregiver. HOW TO PERFORM A BREAST SELF-EXAM The best time to examine your breasts is 5-7 days after your menstrual period is over. During menstruation, the breasts are lumpier, and it may be more difficult to pick up changes. If you do not menstruate, have reached menopause, or had your uterus removed (hysterectomy), you should examine your breasts at regular intervals, such as monthly. If you are breastfeeding, examine your breasts after a feeding or after using a breast pump. Breast implants do not decrease the risk for lumps or tumors, so continue to perform breast self-exams as recommended. Talk to your caregiver about how to determine the difference between the implant and breast tissue. Also, talk about the amount of pressure you should use during the exam. Over time, you will become more familiar with the variations of your breasts and more comfortable with the exam. A breast self-exam requires you to remove all your clothes above the waist. 1. Look at your breasts and nipples.  Stand in front of a mirror in a room with good lighting. With your hands on your hips, push your hands firmly downward. Look for a difference in shape, contour, and size from one breast to the other (asymmetry). Asymmetry includes puckers, dips, or bumps. Also, look for skin changes, such as reddened or scaly areas on the breasts. Look for nipple changes, such as discharge, dimpling, repositioning, or redness. 2. Carefully feel your breasts. This is best done either in the shower or tub while using soapy water or when flat on your back. Place the arm (on the side of the breast you are examining) above your head. Use the pads (not the fingertips) of your three middle fingers on your opposite hand to feel your breasts. Start in the underarm area and use  inch (2 cm) overlapping circles to feel your breast. Use 3 different levels of pressure (light, medium, and firm pressure) at each circle before moving to the next circle. The light pressure is needed to feel the tissue closest to the skin. The medium pressure will help to feel breast tissue a little deeper, while the firm pressure is needed to feel the tissue close to the ribs. Continue the overlapping circles, moving downward over the breast until you feel your ribs below your breast. Then, move one finger-width towards the center of the body. Continue to use the  inch (2 cm) overlapping circles to feel your breast as you move slowly up toward the collar bone (clavicle) near the base of the neck. Continue the up and down exam using all 3 pressures until you reach the   middle of the chest. Do this with each breast, carefully feeling for lumps or changes. 3.  Keep a written record with breast changes or normal findings for each breast. By writing this information down, you do not need to depend only on memory for size, tenderness, or location. Write down where you are in your menstrual cycle, if you are still menstruating. Breast tissue can have some lumps or  thick tissue. However, see your caregiver if you find anything that concerns you.  SEEK MEDICAL CARE IF:  You see a change in shape, contour, or size of your breasts or nipples.   You see skin changes, such as reddened or scaly areas on the breasts or nipples.   You have an unusual discharge from your nipples.   You feel a new lump or unusually thick areas.    This information is not intended to replace advice given to you by your health care provider. Make sure you discuss any questions you have with your health care provider.   Document Released: 04/06/2005 Document Revised: 03/23/2012 Document Reviewed: 07/22/2011 Elsevier Interactive Patient Education 2016 [employer]  

## 2015-11-28 NOTE — Progress Notes (Signed)
Subjective:    Loretta Burton is a 30 y.o. female who presents for oral contraception management. The patient has no complaints today. The patient is sexually active. Pertinent past medical history: none. Denies smoking.   Menstrual History: OB History    Gravida Para Term Preterm AB Living   1 1 1  0 0 1   SAB TAB Ectopic Multiple Live Births   0 0 0 0 1      Having regular, light periods. Has bee taking OCPs on schedule. Patient's last menstrual period was 11/23/2015.  Last pap 2015, normal. Discussed pap guidelines. Declines pap today. Declines STD testing.   The following portions of the patient's history were reviewed and updated as appropriate: allergies, current medications, past family history, past medical history, past social history, past surgical history and problem list.  Review of Systems Pertinent items are noted in HPI.   Objective:    BP 132/79 (BP Location: Left Arm, Patient Position: Sitting, Cuff Size: Large)   Pulse (!) 111   Resp 20   Ht 5\' 4"  (1.626 m)   Wt 213 lb (96.6 kg)   LMP 11/23/2015   BMI 36.56 kg/m  Pelvic Exam: Declined  System: Breast:  No nipple retraction or dimpling, No nipple discharge or bleeding, No axillary or supraclavicular adenopathy, Normal to palpation without dominant masses   Skin: normal coloration and turgor, no rashes    Neurologic: negative   Extremities: normal strength, tone, and muscle mass   HEENT neck supple with midline trachea and thyroid without masses   Mouth/Teeth mucous membranes moist, pharynx normal without lesions   Neck supple and no masses   Cardiovascular: regular rate    Respiratory:  appears well, vitals normal, no respiratory distress, normal RR   Abdomen: soft, non-tender; bowel sounds normal; no masses,  no organomegaly     Assessment:    30 y.o., continuing OCP (estrogen/progesterone), no contraindications.   Plan:    All questions answered. Breast self exam technique reviewed and  patient encouraged to perform self-exam monthly. Contraception: OCP (estrogen/progesterone).   Pap due in 1 year.

## 2015-12-11 ENCOUNTER — Ambulatory Visit (INDEPENDENT_AMBULATORY_CARE_PROVIDER_SITE_OTHER): Payer: Managed Care, Other (non HMO) | Admitting: Family Medicine

## 2015-12-11 ENCOUNTER — Encounter: Payer: Self-pay | Admitting: Family Medicine

## 2015-12-11 VITALS — BP 100/70 | HR 99 | Temp 98.6°F | Wt 211.8 lb

## 2015-12-11 DIAGNOSIS — R52 Pain, unspecified: Secondary | ICD-10-CM

## 2015-12-11 DIAGNOSIS — J029 Acute pharyngitis, unspecified: Secondary | ICD-10-CM | POA: Diagnosis not present

## 2015-12-11 DIAGNOSIS — J0101 Acute recurrent maxillary sinusitis: Secondary | ICD-10-CM | POA: Diagnosis not present

## 2015-12-11 DIAGNOSIS — J302 Other seasonal allergic rhinitis: Secondary | ICD-10-CM

## 2015-12-11 LAB — POCT RAPID STREP A (OFFICE): Rapid Strep A Screen: NEGATIVE

## 2015-12-11 MED ORDER — FLUTICASONE PROPIONATE 50 MCG/ACT NA SUSP: 2.0000 | Freq: Every day | NASAL | 6 refills | Status: DC

## 2015-12-11 MED ORDER — AMOXICILLIN 875 MG PO TABS: 875.0000 mg | ORAL_TABLET | Freq: Two times a day (BID) | ORAL | 0 refills | Status: DC

## 2015-12-11 NOTE — Progress Notes (Signed)
Pre visit review using our clinic review tool, if applicable. No additional management support is needed unless otherwise documented below in the visit note. 

## 2015-12-11 NOTE — Progress Notes (Signed)
Subjective:    Patient ID: Loretta Burton, female    DOB: 06/15/1985, 30 y.o.   MRN: [id number]  HPI This is a pleasant 30 yo female, accompanied by her 7214 month old daughter. She presents today with a month of nasal congestion which has gotten worse over the last 5 days with increasing nasal congestion, runny nose. Has been taking zyrtec d without much relief. Now with cough productive of clear sputum. No wheezing. Has long history of sinus issues. Has used flonase in past. Known ragweed, mold allergies. Has been maintained on zyrtec for several years, switches to zyrtec d when congestion worse. Some ear and facial pressure, post nasal drainage. No known fever. 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    Past Medical History:  Diagnosis Date  . Allergy    seasonal  . Anemia   . Cholelithiasis   . Depression   . GERD (gastroesophageal reflux disease)   . Insomnia   . Panic attack    Past Surgical History:  Procedure Laterality Date  . CHOLECYSTECTOMY N/A 11/28/2014   Procedure: LAPAROSCOPIC CHOLECYSTECTOMY WITH INTRAOPERATIVE CHOLANGIOGRAM;  Surgeon: Lattie Hawichard E Cooper, MD;  Location: [facility] ORS;  Service: General;  Laterality: N/A;  . WISDOM TOOTH EXTRACTION     x4   Family History  Problem Relation Age of Onset  . Diabetes Mother   . Hypertension Mother   . Depression Mother   . Diabetes Maternal Grandmother   . Hypertension Maternal Grandmother   . Heart disease Maternal Grandmother   . Cancer Maternal Grandmother     skin  . Diabetes Paternal Grandmother   . Hypertension Paternal Grandmother   . Heart disease Paternal Grandmother   . Diabetes Paternal Grandfather   . Hypertension Paternal Grandfather   . Heart disease Paternal Grandfather    Social History  Substance Use Topics  . Smoking status: Never Smoker  . Smokeless tobacco: Never Used  . Alcohol use No      Review of Systems Per HPI    Objective:   Physical Exam  Constitutional: She is oriented to person, place,  and time. She appears well-developed and well-nourished. No distress.  HENT:  Head: Normocephalic and atraumatic.  Right Ear: Tympanic membrane, external ear and ear canal normal.  Left Ear: Tympanic membrane, external ear and ear canal normal.  Nose: Mucosal edema, rhinorrhea and septal deviation present. Right sinus exhibits maxillary sinus tenderness. Right sinus exhibits no frontal sinus tenderness. Left sinus exhibits maxillary sinus tenderness. Left sinus exhibits no frontal sinus tenderness.  Mouth/Throat: Uvula is midline and mucous membranes are normal. Posterior oropharyngeal erythema present. No oropharyngeal exudate or posterior oropharyngeal edema.  Eyes: Conjunctivae are normal.  Neck: Normal range of motion. Neck supple.  Cardiovascular: Normal rate, regular rhythm and normal heart sounds.   Pulmonary/Chest: Effort normal and breath sounds normal.  Frequent, non productive cough.   Lymphadenopathy:    She has no cervical adenopathy.  Neurological: She is alert and oriented to person, place, and time.  Skin: Skin is warm and dry. She is not diaphoretic.  Psychiatric: She has a normal mood and affect. Her behavior is normal. Judgment and thought content normal.  Vitals reviewed.     BP 100/70   Pulse 99   Temp 98.6 F (37 C)   Wt 211 lb 12.8 oz (96.1 kg)   LMP 11/23/2015   SpO2 97%   BMI 36.36 kg/m  Wt Readings from Last 3 Encounters:  12/11/15 211 lb 12.8 oz (  96.1 kg)  11/28/15 213 lb (96.6 kg)  11/21/15 213 lb (96.6 kg)   Results for orders placed or performed in visit on 12/11/15  POCT rapid strep A  Result Value Ref Range   Rapid Strep A Screen Negative Negative        Assessment & Plan:  1. Body aches - POCT rapid strep A  2. Sore throat - POCT rapid strep A- negative  3. Acute recurrent maxillary sinusitis - amoxicillin (AMOXIL) 875 MG tablet; Take 1 tablet (875 mg total) by mouth 2 (two) times daily.  Dispense: 20 tablet; Refill: 0 - continue  antihistamine/decongestant, rest, hydrate - RTC precautions reviewed  4. Other seasonal allergic rhinitis - Provided written and verbal information regarding diagnosis and treatment. - continue antihistamine, may need to try a different one since she has been on Zyrtec for long time. - fluticasone (FLONASE) 50 MCG/ACT nasal spray; Place 2 sprays into both nostrils daily.  Dispense: 16 g; Refill: 6   Olean Reeeborah Ashford Clouse, FNP-BC  [facility], [facility]  12/11/2015 12:40 PM

## 2015-12-11 NOTE — Patient Instructions (Signed)
Allergic Rhinitis Allergic rhinitis is when the mucous membranes in the nose respond to allergens. Allergens are particles in the air that cause your body to have an allergic reaction. This causes you to release allergic antibodies. Through a chain of events, these eventually cause you to release histamine into the blood stream. Although meant to protect the body, it is this release of histamine that causes your discomfort, such as frequent sneezing, congestion, and an itchy, runny nose.  CAUSES Seasonal allergic rhinitis (hay fever) is caused by pollen allergens that may come from grasses, trees, and weeds. Year-round allergic rhinitis (perennial allergic rhinitis) is caused by allergens such as house dust mites, pet dander, and mold spores. SYMPTOMS  Nasal stuffiness (congestion).  Itchy, runny nose with sneezing and tearing of the eyes. DIAGNOSIS Your health care provider can help you determine the allergen or allergens that trigger your symptoms. If you and your health care provider are unable to determine the allergen, skin or blood testing may be used. Your health care provider will diagnose your condition after taking your health history and performing a physical exam. Your health care provider may assess you for other related conditions, such as asthma, pink eye, or an ear infection. TREATMENT Allergic rhinitis does not have a cure, but it can be controlled by:  Medicines that block allergy symptoms. These may include allergy shots, nasal sprays, and oral antihistamines.  Avoiding the allergen. Hay fever may often be treated with antihistamines in pill or nasal spray forms. Antihistamines block the effects of histamine. There are over-the-counter medicines that may help with nasal congestion and swelling around the eyes. Check with your health care provider before taking or giving this medicine. If avoiding the allergen or the medicine prescribed do not work, there are many new medicines  your health care provider can prescribe. Stronger medicine may be used if initial measures are ineffective. Desensitizing injections can be used if medicine and avoidance does not work. Desensitization is when a patient is given ongoing shots until the body becomes less sensitive to the allergen. Make sure you follow up with your health care provider if problems continue. HOME CARE INSTRUCTIONS It is not possible to completely avoid allergens, but you can reduce your symptoms by taking steps to limit your exposure to them. It helps to know exactly what you are allergic to so that you can avoid your specific triggers. SEEK MEDICAL CARE IF:  You have a fever.  You develop a cough that does not stop easily (persistent).  You have shortness of breath.  You start wheezing.  Symptoms interfere with normal daily activities.   This information is not intended to replace advice given to you by your health care provider. Make sure you discuss any questions you have with your health care provider.   Document Released: 12/30/2000 Document Revised: 04/27/2014 Document Reviewed: 12/12/2012 Elsevier Interactive Patient Education 2016 Elsevier Inc.  

## 2016-01-03 ENCOUNTER — Ambulatory Visit: Payer: Self-pay | Admitting: Primary Care

## 2016-01-09 ENCOUNTER — Encounter: Payer: Self-pay | Admitting: Primary Care

## 2016-01-09 ENCOUNTER — Ambulatory Visit (INDEPENDENT_AMBULATORY_CARE_PROVIDER_SITE_OTHER): Payer: Managed Care, Other (non HMO) | Admitting: Primary Care

## 2016-01-09 VITALS — BP 122/78 | HR 96 | Temp 98.0°F | Ht 64.0 in | Wt 211.0 lb

## 2016-01-09 DIAGNOSIS — F329 Major depressive disorder, single episode, unspecified: Secondary | ICD-10-CM

## 2016-01-09 DIAGNOSIS — F419 Anxiety disorder, unspecified: Secondary | ICD-10-CM

## 2016-01-09 DIAGNOSIS — F418 Other specified anxiety disorders: Secondary | ICD-10-CM | POA: Diagnosis not present

## 2016-01-09 DIAGNOSIS — F411 Generalized anxiety disorder: Secondary | ICD-10-CM | POA: Diagnosis not present

## 2016-01-09 MED ORDER — ESCITALOPRAM OXALATE 10 MG PO TABS: 10.0000 mg | ORAL_TABLET | Freq: Every day | ORAL | 1 refills | Status: DC

## 2016-01-09 NOTE — Assessment & Plan Note (Signed)
Increased symptoms of both over past 2-3 months, no longer able to manage with self calming techniques. GAD 7 score of 14 and PHQ 9 score of 13 today.  Will restart Lexapro as she was successful on this in the past. Patient is to take 1/2 tablet daily for 8 days, then advance to 1 full tablet thereafter. We discussed possible side effects of headache, GI upset, drowsiness, and SI/HI. If thoughts of SI/HI develop, we discussed to present to the emergency immediately. Patient verbalized understanding.   Follow up in 6 weeks for re-evaluation.

## 2016-01-09 NOTE — Patient Instructions (Signed)
Start Lexapro 10 mg tablets for anxiety and depression. Start by taking 1/2 tablet by mouth daily for 8 days, then advance to 1 full tablet thereafter.  Follow up in 6 weeks for re-evaluation.  It was a pleasure to see you today!

## 2016-01-09 NOTE — Progress Notes (Signed)
Pre visit review using our clinic review tool, if applicable. No additional management support is needed unless otherwise documented below in the visit note. 

## 2016-01-09 NOTE — Progress Notes (Signed)
Subjective:    Patient ID: Loretta Burton, female    DOB: January 17, 1986, 30 y.o.   MRN: [id number]  HPI  Loretta Burton is a 30 year old female who presents today with a chief complaint of anxiety and depression. Prior history of anxiety and depression several ago. She was once managed on Lexapro 10 mg, Xanax, and Trazodone. She's not required use of her medication in 2 years.   Over the past 2 months she's noticed depression and anxiety. She tried to managed her symptoms through self calming techniques, but has recently felt she's been unable to do so on her own. Her anixety and depression had previously been as bad as her developing suicidal thoughts so she's afraid to get back to that stage.  Her symptoms include chest pain, feeling anxious, palpitations, feeling down, increased irritability, difficulty sleeping. She's changed shifts from 3rd to 2nd one month ago, but this has been overall positive. She denies any increased stress in her work or home life. She has been sleeping better over the past 1-2 weeks as she's started reading before bed.  GAD 7 score of 14 and PHQ 9 score of 13. Denies SI/HI.  Review of Systems  Respiratory: Negative for shortness of breath.   Cardiovascular: Positive for chest pain and palpitations.  Neurological: Negative for dizziness.  Psychiatric/Behavioral: Positive for sleep disturbance. Negative for suicidal ideas. The patient is nervous/anxious.        Past Medical History:  Diagnosis Date  . Allergy    seasonal  . Anemia   . Cholelithiasis   . Depression   . GERD (gastroesophageal reflux disease)   . Insomnia   . Panic attack      Social History   Social History  . Marital status: Married    Spouse name: N/A  . Number of children: N/A  . Years of education: N/A   Occupational History  . Not on file.   Social History Main Topics  . Smoking status: Never Smoker  . Smokeless tobacco: Never Used  . Alcohol use No  . Drug use: No  .  Sexual activity: Yes    Partners: Male    Birth control/ protection: Condom, Pill   Other Topics Concern  . Not on file   Social History Narrative   Married.   1 daughter.   Works at [employer] as a Lawyer.   Enjoys crafting, spending time with her daughter    Past Surgical History:  Procedure Laterality Date  . CHOLECYSTECTOMY N/A 11/28/2014   Procedure: LAPAROSCOPIC CHOLECYSTECTOMY WITH INTRAOPERATIVE CHOLANGIOGRAM;  Surgeon: Lattie Haw, MD;  Location: [facility] ORS;  Service: General;  Laterality: N/A;  . WISDOM TOOTH EXTRACTION     x4    Family History  Problem Relation Age of Onset  . Diabetes Mother   . Hypertension Mother   . Depression Mother   . Diabetes Maternal Grandmother   . Hypertension Maternal Grandmother   . Heart disease Maternal Grandmother   . Cancer Maternal Grandmother     skin  . Diabetes Paternal Grandmother   . Hypertension Paternal Grandmother   . Heart disease Paternal Grandmother   . Diabetes Paternal Grandfather   . Hypertension Paternal Grandfather   . Heart disease Paternal Grandfather     Allergies  Allergen Reactions  . Adhesive [Tape] Itching  . Ciprofloxacin Hives and Nausea And Vomiting    Current Outpatient Prescriptions on File Prior to Visit  Medication Sig Dispense Refill  .  fluticasone (FLONASE) 50 MCG/ACT nasal spray Place 2 sprays into both nostrils daily. 16 g 6  . Melatonin 10 MG CAPS Take 1 tablet by mouth at bedtime.    . Norethindrone Acetate-Ethinyl Estrad-FE (LOESTRIN 24 FE) 1-20 MG-MCG(24) tablet Take 1 tablet by mouth daily. 1 Package 13   No current facility-administered medications on file prior to visit.     BP 122/78   Pulse 96   Temp 98 F (36.7 C) (Oral)   Ht 5\' 4"  (1.626 m)   Wt 211 lb (95.7 kg)   LMP 12/09/2015   SpO2 98%   BMI 36.22 kg/m    Objective:   Physical Exam  Constitutional: She appears well-nourished.  Neck: Neck supple.  Cardiovascular: Normal rate and regular rhythm.     Pulmonary/Chest: Effort normal and breath sounds normal.  Skin: Skin is warm and dry.  Psychiatric: She has a normal mood and affect.          Assessment & Plan:

## 2016-02-03 ENCOUNTER — Ambulatory Visit (INDEPENDENT_AMBULATORY_CARE_PROVIDER_SITE_OTHER): Payer: Managed Care, Other (non HMO) | Admitting: Family Medicine

## 2016-02-03 ENCOUNTER — Encounter: Payer: Self-pay | Admitting: Family Medicine

## 2016-02-03 DIAGNOSIS — J069 Acute upper respiratory infection, unspecified: Secondary | ICD-10-CM | POA: Diagnosis not present

## 2016-02-03 MED ORDER — AMOXICILLIN 875 MG PO TABS: 875.0000 mg | ORAL_TABLET | Freq: Two times a day (BID) | ORAL | 0 refills | Status: AC

## 2016-02-03 NOTE — Patient Instructions (Signed)
Presumed strep throat.  Start amoxil.  Rest and fluids.   Update us as needed.  Take care.  Glad to see you.

## 2016-02-03 NOTE — Progress Notes (Signed)
Sx started last week, about 6 days ago.  Out of work in the meantime.  ST.  Fever.  Aches diffusely.  Voice is altered.  Muscles in her neck were sore, likely had tender LA B per patient report.  Congestion.  No vomiting, no diarrhea.  Works at hospice.  Minimal cough.  Fatigued.   She feels better after taking 3 doses of agumentin.    Meds, vitals, and allergies reviewed.   ROS: Per HPI unless specifically indicated in ROS section   GEN: nad, alert and oriented HEENT: mucous membranes moist, tm w/o erythema, nasal exam w/o erythema, clear discharge noted,  OP with cobblestoning, no exudates NECK: supple w/tender LA CV: rrr.   PULM: ctab, no inc wob EXT: no edema SKIN: no acute rash

## 2016-02-03 NOTE — Progress Notes (Signed)
Pre visit review using our clinic review tool, if applicable. No additional management support is needed unless otherwise documented below in the visit note. 

## 2016-02-04 DIAGNOSIS — J069 Acute upper respiratory infection, unspecified: Secondary | ICD-10-CM | POA: Insufficient documentation

## 2016-02-04 NOTE — Assessment & Plan Note (Signed)
Presumed strep throat.  Start amoxil.  Rest and fluids.   Update us as needed.

## 2016-02-21 ENCOUNTER — Encounter: Payer: Self-pay | Admitting: Primary Care

## 2016-02-21 ENCOUNTER — Ambulatory Visit (INDEPENDENT_AMBULATORY_CARE_PROVIDER_SITE_OTHER): Payer: Managed Care, Other (non HMO) | Admitting: Primary Care

## 2016-02-21 DIAGNOSIS — F329 Major depressive disorder, single episode, unspecified: Secondary | ICD-10-CM

## 2016-02-21 DIAGNOSIS — F418 Other specified anxiety disorders: Secondary | ICD-10-CM

## 2016-02-21 DIAGNOSIS — F32A Depression, unspecified: Secondary | ICD-10-CM

## 2016-02-21 DIAGNOSIS — F419 Anxiety disorder, unspecified: Secondary | ICD-10-CM

## 2016-02-21 NOTE — Patient Instructions (Signed)
Continue Lexapro 10 mg tablets. Please notify your pharmacy when you need refills.   Follow up in 1 year or sooner if needed.  It was a pleasure to see you today!

## 2016-02-21 NOTE — Progress Notes (Signed)
Subjective:    Patient ID: Loretta Burton, female    DOB: 08/15/1985, 30 y.o.   MRN: [id number]  HPI  Loretta Burton is a 30 year old female who presents today for follow up of anxiety and depression. Evaluated on 01/09/16 with increased symptoms of anxiety, palpitations, feeing down, irritability, etc. She was previously managed on Lexapro  with improvement, had not taken in 2 years. GAD 7 score of 14 and PHQ 9 score of 13 last visit, therefore, her Lexapro was restarted.   Since her last visit she's feeling much improved. She's feeling more energized, less irritable, less "down", and is enjoying her typical activities again. Denies GI upset, Headaches, SI/HI.   Review of Systems  Gastrointestinal: Negative for abdominal pain.  Neurological: Negative for headaches.  Psychiatric/Behavioral: Negative for sleep disturbance and suicidal ideas. The patient is not nervous/anxious.        Past Medical History:  Diagnosis Date  . Allergy    seasonal  . Anemia   . Cholelithiasis   . Depression   . GERD (gastroesophageal reflux disease)   . Insomnia   . Panic attack      Social History   Social History  . Marital status: Married    Spouse name: N/A  . Number of children: N/A  . Years of education: N/A   Occupational History  . Not on file.   Social History Main Topics  . Smoking status: Never Smoker  . Smokeless tobacco: Never Used  . Alcohol use No  . Drug use: No  . Sexual activity: Yes    Partners: Male    Birth control/ protection: Condom, Pill   Other Topics Concern  . Not on file   Social History Narrative   Married.   1 daughter.   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   Enjoys crafting, spending time with her daughter    Past Surgical History:  Procedure Laterality Date  . CHOLECYSTECTOMY N/A 11/28/2014   Procedure: LAPAROSCOPIC CHOLECYSTECTOMY WITH INTRAOPERATIVE CHOLANGIOGRAM;  Surgeon: Lattie Hawichard E Cooper, MD;  Location: [facility] ORS;  Service: General;   Laterality: N/A;  . WISDOM TOOTH EXTRACTION     x4    Family History  Problem Relation Age of Onset  . Diabetes Mother   . Hypertension Mother   . Depression Mother   . Diabetes Maternal Grandmother   . Hypertension Maternal Grandmother   . Heart disease Maternal Grandmother   . Cancer Maternal Grandmother     skin  . Diabetes Paternal Grandmother   . Hypertension Paternal Grandmother   . Heart disease Paternal Grandmother   . Diabetes Paternal Grandfather   . Hypertension Paternal Grandfather   . Heart disease Paternal Grandfather     Allergies  Allergen Reactions  . Adhesive [Tape] Itching  . Ciprofloxacin Hives and Nausea And Vomiting    Current Outpatient Prescriptions on File Prior to Visit  Medication Sig Dispense Refill  . escitalopram (LEXAPRO) 10 MG tablet Take 1 tablet (10 mg total) by mouth daily. 90 tablet 1  . fluticasone (FLONASE) 50 MCG/ACT nasal spray Place 2 sprays into both nostrils daily. 16 g 6  . loratadine (CLARITIN) 10 MG tablet Take 10 mg by mouth daily.    . Melatonin 10 MG CAPS Take 1 tablet by mouth at bedtime.    . Norethindrone Acetate-Ethinyl Estrad-FE (LOESTRIN 24 FE) 1-20 MG-MCG(24) tablet Take 1 tablet by mouth daily. 1 Package 13   No current facility-administered medications on file  prior to visit.     BP 104/68   Pulse 68   Temp 98.2 F (36.8 C) (Oral)   Ht 5\' 4"  (1.626 m)   Wt 213 lb 6.4 oz (96.8 kg)   LMP 02/10/2016   SpO2 98%   BMI 36.63 kg/m    Objective:   Physical Exam  Constitutional: She appears well-nourished.  Cardiovascular: Normal rate and regular rhythm.   Pulmonary/Chest: Effort normal and breath sounds normal.  Skin: Skin is warm and dry.  Psychiatric: She has a normal mood and affect.  Much improved compared to last visit          Assessment & Plan:

## 2016-02-21 NOTE — Assessment & Plan Note (Signed)
Improved on Lexapro 10 mg, continue same. Will continue to monitor. Denies SI/HI.

## 2016-02-21 NOTE — Progress Notes (Signed)
Pre visit review using our clinic review tool, if applicable. No additional management support is needed unless otherwise documented below in the visit note. 

## 2016-03-27 ENCOUNTER — Ambulatory Visit (INDEPENDENT_AMBULATORY_CARE_PROVIDER_SITE_OTHER): Payer: Managed Care, Other (non HMO) | Admitting: Primary Care

## 2016-03-27 ENCOUNTER — Encounter: Payer: Self-pay | Admitting: Primary Care

## 2016-03-27 VITALS — BP 120/80 | HR 104 | Temp 99.0°F | Ht 64.0 in | Wt 215.8 lb

## 2016-03-27 DIAGNOSIS — J069 Acute upper respiratory infection, unspecified: Secondary | ICD-10-CM | POA: Diagnosis not present

## 2016-03-27 MED ORDER — HYDROCOD POLST-CPM POLST ER 10-8 MG/5ML PO SUER: 5.0000 mL | Freq: Two times a day (BID) | ORAL | 0 refills | Status: DC | PRN

## 2016-03-27 NOTE — Progress Notes (Signed)
Subjective:    Patient ID: Loretta Burton, female    DOB: 03/31/1986, 30 y.o.   MRN: [id number]  HPI  Ms. Loretta Burton is a 30 year old female who presents today with a chief complaint of cough. She also reports sore throat, low grade fevers, nasal congestion, rhinorrhea, itchy eyes. Her symptoms began 3 days ago with worsening symptoms 2 days ago. She's taken Alka-Seltzer, allergy/sinus pressure, Claritin, Flonase without much improvement. Her cough is non productive and is worse at night. She had her flu shot this season.  Review of Systems  Constitutional: Positive for chills, fatigue and fever.  HENT: Positive for congestion, postnasal drip and sore throat.   Respiratory: Positive for cough. Negative for shortness of breath and wheezing.   Cardiovascular: Negative for chest pain.       Past Medical History:  Diagnosis Date  . Allergy    seasonal  . Anemia   . Cholelithiasis   . Depression   . GERD (gastroesophageal reflux disease)   . Insomnia   . Panic attack      Social History   Social History  . Marital status: Married    Spouse name: N/A  . Number of children: N/A  . Years of education: N/A   Occupational History  . Not on file.   Social History Main Topics  . Smoking status: Never Smoker  . Smokeless tobacco: Never Used  . Alcohol use No  . Drug use: No  . Sexual activity: Yes    Partners: Male    Birth control/ protection: Condom, Pill   Other Topics Concern  . Not on file   Social History Narrative   Married.   1 daughter.   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   Enjoys crafting, spending time with her daughter    Past Surgical History:  Procedure Laterality Date  . CHOLECYSTECTOMY N/A 11/28/2014   Procedure: LAPAROSCOPIC CHOLECYSTECTOMY WITH INTRAOPERATIVE CHOLANGIOGRAM;  Surgeon: Lattie Hawichard E Cooper, MD;  Location: [facility] ORS;  Service: General;  Laterality: N/A;  . WISDOM TOOTH EXTRACTION     x4    Family History  Problem Relation Age of  Onset  . Diabetes Mother   . Hypertension Mother   . Depression Mother   . Diabetes Maternal Grandmother   . Hypertension Maternal Grandmother   . Heart disease Maternal Grandmother   . Cancer Maternal Grandmother     skin  . Diabetes Paternal Grandmother   . Hypertension Paternal Grandmother   . Heart disease Paternal Grandmother   . Diabetes Paternal Grandfather   . Hypertension Paternal Grandfather   . Heart disease Paternal Grandfather     Allergies  Allergen Reactions  . Adhesive [Tape] Itching  . Ciprofloxacin Hives and Nausea And Vomiting    Current Outpatient Prescriptions on File Prior to Visit  Medication Sig Dispense Refill  . escitalopram (LEXAPRO) 10 MG tablet Take 1 tablet (10 mg total) by mouth daily. 90 tablet 1  . fluticasone (FLONASE) 50 MCG/ACT nasal spray Place 2 sprays into both nostrils daily. 16 g 6  . loratadine (CLARITIN) 10 MG tablet Take 10 mg by mouth daily.    . Melatonin 10 MG CAPS Take 1 tablet by mouth at bedtime.    . Norethindrone Acetate-Ethinyl Estrad-FE (LOESTRIN 24 FE) 1-20 MG-MCG(24) tablet Take 1 tablet by mouth daily. 1 Package 13   No current facility-administered medications on file prior to visit.     BP 120/80   Pulse (!) 104  Temp 99 F (37.2 C) (Oral)   Ht 5\' 4"  (1.626 m)   Wt 215 lb 12.8 oz (97.9 kg)   LMP 03/20/2016   SpO2 94%   BMI 37.04 kg/m    Objective:   Physical Exam  Constitutional: She appears well-nourished. She appears ill.  HENT:  Right Ear: Tympanic membrane and ear canal normal.  Left Ear: Tympanic membrane and ear canal normal.  Nose: Mucosal edema present. Right sinus exhibits no maxillary sinus tenderness and no frontal sinus tenderness. Left sinus exhibits no maxillary sinus tenderness and no frontal sinus tenderness.  Mouth/Throat: Posterior oropharyngeal erythema present. No oropharyngeal exudate or posterior oropharyngeal edema.  Eyes: Conjunctivae are normal.  Neck: Neck supple.    Cardiovascular: Normal rate.   Sinus tachycardia at 105  Pulmonary/Chest: Effort normal and breath sounds normal. She has no wheezes. She has no rales.  Lymphadenopathy:    She has no cervical adenopathy.  Skin: Skin is warm and dry.          Assessment & Plan:  Viral URI:  Cough, congestion, fatigue, low grade fever x 3 days. Little to no improvement with OTC treatment. Exam today with clear lungs, does appear acutely ill, not toxic. Sinus tachycardia with rate of 105, low grade fever in office today. Rapid Flu: Negative. Suspect viral URI and will treat with supportive measures. Rx for Tussionex provided. Delsym, Mucinex, fluids, rest. Return precautions provided.  Morrie Sheldonlark,Vishruth Seoane Kendal, NP

## 2016-03-27 NOTE — Patient Instructions (Signed)
Your symptoms are representative of a viral illness which will resolve on its own over time. Our goal is to treat your symptoms in order to aid your body in the healing process and to make you more comfortable.   You may take the Tussionex cough suppressant twice daily as needed for cough and rest. Caution this medication contains codeine and will make you feel drowsy.  Cough/Congestion: Try taking Mucinex DM. This will help loosen up the mucous in your chest. Ensure you take this medication with a full glass of water.  Please notify me if you develop persistent fevers of 101, start coughing up green mucous, notice increased fatigue or weakness, or feel worse after 1 week of onset of symptoms.   Increase consumption of water intake and rest.  It was a pleasure to see you today!   Upper Respiratory Infection, Adult Most upper respiratory infections (URIs) are a viral infection of the air passages leading to the lungs. A URI affects the nose, throat, and upper air passages. The most common type of URI is nasopharyngitis and is typically referred to as "the common cold." URIs run their course and usually go away on their own. Most of the time, a URI does not require medical attention, but sometimes a bacterial infection in the upper airways can follow a viral infection. This is called a secondary infection. Sinus and middle ear infections are common types of secondary upper respiratory infections. Bacterial pneumonia can also complicate a URI. A URI can worsen asthma and chronic obstructive pulmonary disease (COPD). Sometimes, these complications can require emergency medical care and may be life threatening. What are the causes? Almost all URIs are caused by viruses. A virus is a type of germ and can spread from one person to another. What increases the risk? You may be at risk for a URI if:  You smoke.  You have chronic heart or lung disease.  You have a weakened defense (immune)  system.  You are very young or very old.  You have nasal allergies or asthma.  You work in crowded or poorly ventilated areas.  You work in health care facilities or schools. What are the signs or symptoms? Symptoms typically develop 2-3 days after you come in contact with a cold virus. Most viral URIs last 7-10 days. However, viral URIs from the influenza virus (flu virus) can last 14-18 days and are typically more severe. Symptoms may include:  Runny or stuffy (congested) nose.  Sneezing.  Cough.  Sore throat.  Headache.  Fatigue.  Fever.  Loss of appetite.  Pain in your forehead, behind your eyes, and over your cheekbones (sinus pain).  Muscle aches. How is this diagnosed? Your health care provider may diagnose a URI by:  Physical exam.  Tests to check that your symptoms are not due to another condition such as:  Strep throat.  Sinusitis.  Pneumonia.  Asthma. How is this treated? A URI goes away on its own with time. It cannot be cured with medicines, but medicines may be prescribed or recommended to relieve symptoms. Medicines may help:  Reduce your fever.  Reduce your cough.  Relieve nasal congestion. Follow these instructions at home:  Take medicines only as directed by your health care provider.  Gargle warm saltwater or take cough drops to comfort your throat as directed by your health care provider.  Use a warm mist humidifier or inhale steam from a shower to increase air moisture. This may make it easier to breathe.  Drink enough fluid to keep your urine clear or pale yellow.  Eat soups and other clear broths and maintain good nutrition.  Rest as needed.  Return to work when your temperature has returned to normal or as your health care provider advises. You may need to stay home longer to avoid infecting others. You can also use a face mask and careful hand washing to prevent spread of the virus.  Increase the usage of your inhaler if  you have asthma.  Do not use any tobacco products, including cigarettes, chewing tobacco, or electronic cigarettes. If you need help quitting, ask your health care provider. How is this prevented? The best way to protect yourself from getting a cold is to practice good hygiene.  Avoid oral or hand contact with people with cold symptoms.  Wash your hands often if contact occurs. There is no clear evidence that vitamin C, vitamin E, echinacea, or exercise reduces the chance of developing a cold. However, it is always recommended to get plenty of rest, exercise, and practice good nutrition. Contact a health care provider if:  You are getting worse rather than better.  Your symptoms are not controlled by medicine.  You have chills.  You have worsening shortness of breath.  You have brown or red mucus.  You have yellow or brown nasal discharge.  You have pain in your face, especially when you bend forward.  You have a fever.  You have swollen neck glands.  You have pain while swallowing.  You have white areas in the back of your throat. Get help right away if:  You have severe or persistent:  Headache.  Ear pain.  Sinus pain.  Chest pain.  You have chronic lung disease and any of the following:  Wheezing.  Prolonged cough.  Coughing up blood.  A change in your usual mucus.  You have a stiff neck.  You have changes in your:  Vision.  Hearing.  Thinking.  Mood. This information is not intended to replace advice given to you by your health care provider. Make sure you discuss any questions you have with your health care provider. Document Released: 09/30/2000 Document Revised: 12/08/2015 Document Reviewed: 07/12/2013 Elsevier Interactive Patient Education  2017 [employer]

## 2016-03-27 NOTE — Progress Notes (Signed)
Pre visit review using our clinic review tool, if applicable. No additional management support is needed unless otherwise documented below in the visit note. 

## 2016-04-15 ENCOUNTER — Encounter: Payer: Self-pay | Admitting: Primary Care

## 2016-04-15 ENCOUNTER — Other Ambulatory Visit: Payer: Self-pay | Admitting: Primary Care

## 2016-04-15 DIAGNOSIS — F32A Depression, unspecified: Secondary | ICD-10-CM

## 2016-04-15 DIAGNOSIS — F419 Anxiety disorder, unspecified: Secondary | ICD-10-CM

## 2016-04-15 DIAGNOSIS — F329 Major depressive disorder, single episode, unspecified: Secondary | ICD-10-CM

## 2016-04-15 MED ORDER — ESCITALOPRAM OXALATE 20 MG PO TABS: 20.0000 mg | ORAL_TABLET | Freq: Every day | ORAL | 1 refills | Status: DC

## 2016-07-24 ENCOUNTER — Encounter: Payer: Managed Care, Other (non HMO) | Admitting: Primary Care

## 2016-08-02 ENCOUNTER — Other Ambulatory Visit: Payer: Self-pay | Admitting: Primary Care

## 2016-08-02 DIAGNOSIS — F32A Depression, unspecified: Secondary | ICD-10-CM

## 2016-08-02 DIAGNOSIS — F419 Anxiety disorder, unspecified: Secondary | ICD-10-CM

## 2016-08-02 DIAGNOSIS — F329 Major depressive disorder, single episode, unspecified: Secondary | ICD-10-CM

## 2016-10-05 ENCOUNTER — Ambulatory Visit (INDEPENDENT_AMBULATORY_CARE_PROVIDER_SITE_OTHER): Payer: Managed Care, Other (non HMO) | Admitting: Primary Care

## 2016-10-05 ENCOUNTER — Encounter: Payer: Self-pay | Admitting: Primary Care

## 2016-10-05 VITALS — BP 122/82 | HR 99 | Temp 98.2°F | Ht 64.0 in | Wt 227.1 lb

## 2016-10-05 DIAGNOSIS — J069 Acute upper respiratory infection, unspecified: Secondary | ICD-10-CM | POA: Diagnosis not present

## 2016-10-05 MED ORDER — BENZONATATE 200 MG PO CAPS: 200.0000 mg | ORAL_CAPSULE | Freq: Three times a day (TID) | ORAL | 0 refills | Status: DC | PRN

## 2016-10-05 NOTE — Patient Instructions (Addendum)
You may take Benzonatate capsules for cough. Take 1 capsule by mouth three times daily as needed for cough.  Use Flonase twice daily. Instill one spray in each nostril twice daily.  Ensure you are staying hydrated with water and rest.  It was a pleasure to see you today!   Upper Respiratory Infection, Adult Most upper respiratory infections (URIs) are a viral infection of the air passages leading to the lungs. A URI affects the nose, throat, and upper air passages. The most common type of URI is nasopharyngitis and is typically referred to as "the common cold." URIs run their course and usually go away on their own. Most of the time, a URI does not require medical attention, but sometimes a bacterial infection in the upper airways can follow a viral infection. This is called a secondary infection. Sinus and middle ear infections are common types of secondary upper respiratory infections. Bacterial pneumonia can also complicate a URI. A URI can worsen asthma and chronic obstructive pulmonary disease (COPD). Sometimes, these complications can require emergency medical care and may be life threatening. What are the causes? Almost all URIs are caused by viruses. A virus is a type of germ and can spread from one person to another. What increases the risk? You may be at risk for a URI if:  You smoke.  You have chronic heart or lung disease.  You have a weakened defense (immune) system.  You are very young or very old.  You have nasal allergies or asthma.  You work in crowded or poorly ventilated areas.  You work in health care facilities or schools.  What are the signs or symptoms? Symptoms typically develop 2-3 days after you come in contact with a cold virus. Most viral URIs last 7-10 days. However, viral URIs from the influenza virus (flu virus) can last 14-18 days and are typically more severe. Symptoms may include:  Runny or stuffy (congested) nose.  Sneezing.  Cough.  Sore  throat.  Headache.  Fatigue.  Fever.  Loss of appetite.  Pain in your forehead, behind your eyes, and over your cheekbones (sinus pain).  Muscle aches.  How is this diagnosed? Your health care provider may diagnose a URI by:  Physical exam.  Tests to check that your symptoms are not due to another condition such as: ? Strep throat. ? Sinusitis. ? Pneumonia. ? Asthma.  How is this treated? A URI goes away on its own with time. It cannot be cured with medicines, but medicines may be prescribed or recommended to relieve symptoms. Medicines may help:  Reduce your fever.  Reduce your cough.  Relieve nasal congestion.  Follow these instructions at home:  Take medicines only as directed by your health care provider.  Gargle warm saltwater or take cough drops to comfort your throat as directed by your health care provider.  Use a warm mist humidifier or inhale steam from a shower to increase air moisture. This may make it easier to breathe.  Drink enough fluid to keep your urine clear or pale yellow.  Eat soups and other clear broths and maintain good nutrition.  Rest as needed.  Return to work when your temperature has returned to normal or as your health care provider advises. You may need to stay home longer to avoid infecting others. You can also use a face mask and careful hand washing to prevent spread of the virus.  Increase the usage of your inhaler if you have asthma.  Do not use  any tobacco products, including cigarettes, chewing tobacco, or electronic cigarettes. If you need help quitting, ask your health care provider. How is this prevented? The best way to protect yourself from getting a cold is to practice good hygiene.  Avoid oral or hand contact with people with cold symptoms.  Wash your hands often if contact occurs.  There is no clear evidence that vitamin C, vitamin E, echinacea, or exercise reduces the chance of developing a cold. However, it is  always recommended to get plenty of rest, exercise, and practice good nutrition. Contact a health care provider if:  You are getting worse rather than better.  Your symptoms are not controlled by medicine.  You have chills.  You have worsening shortness of breath.  You have brown or red mucus.  You have yellow or brown nasal discharge.  You have pain in your face, especially when you bend forward.  You have a fever.  You have swollen neck glands.  You have pain while swallowing.  You have white areas in the back of your throat. Get help right away if:  You have severe or persistent: ? Headache. ? Ear pain. ? Sinus pain. ? Chest pain.  You have chronic lung disease and any of the following: ? Wheezing. ? Prolonged cough. ? Coughing up blood. ? A change in your usual mucus.  You have a stiff neck.  You have changes in your: ? Vision. ? Hearing. ? Thinking. ? Mood. This information is not intended to replace advice given to you by your health care provider. Make sure you discuss any questions you have with your health care provider. Document Released: 09/30/2000 Document Revised: 12/08/2015 Document Reviewed: 07/12/2013 Elsevier Interactive Patient Education  2017 [employer]

## 2016-10-05 NOTE — Progress Notes (Signed)
Subjective:    Patient ID: Loretta Burton, female    DOB: 1985-06-25, 31 y.o.   MRN: [id number]  HPI  Loretta Burton is a 31 year old female with a history of recurrent sinusitis who presents today with a chief complaint of cough. She also reports chest congestion, nasal congestion, headache, fatigue, ear pain.  Her symptoms began 5 days ago. She's taken Mucinex-D, "multi-symptom" pill, Delsym DM with some improvement. Her cough is productive with mildly green sputum. Her daughter was ill with similar symptoms last week.  Review of Systems  Constitutional: Positive for fatigue. Negative for chills and fever.  HENT: Positive for congestion, ear pain and sinus pressure. Negative for sore throat.   Respiratory: Positive for cough. Negative for shortness of breath and wheezing.        Past Medical History:  Diagnosis Date  . Allergy    seasonal  . Anemia   . Cholelithiasis   . Depression   . GERD (gastroesophageal reflux disease)   . Insomnia   . Panic attack      Social History   Social History  . Marital status: Married    Spouse name: N/A  . Number of children: N/A  . Years of education: N/A   Occupational History  . Not on file.   Social History Main Topics  . Smoking status: Never Smoker  . Smokeless tobacco: Never Used  . Alcohol use No  . Drug use: No  . Sexual activity: Yes    Partners: Male    Birth control/ protection: Condom, Pill   Other Topics Concern  . Not on file   Social History Narrative   Married.   1 daughter.   Works at [employer] as a Lawyer.   Enjoys crafting, spending time with her daughter    Past Surgical History:  Procedure Laterality Date  . CHOLECYSTECTOMY N/A 11/28/2014   Procedure: LAPAROSCOPIC CHOLECYSTECTOMY WITH INTRAOPERATIVE CHOLANGIOGRAM;  Surgeon: Lattie Haw, MD;  Location: [facility] ORS;  Service: General;  Laterality: N/A;  . WISDOM TOOTH EXTRACTION     x4    Family History  Problem Relation Age of Onset    . Diabetes Mother   . Hypertension Mother   . Depression Mother   . Diabetes Maternal Grandmother   . Hypertension Maternal Grandmother   . Heart disease Maternal Grandmother   . Cancer Maternal Grandmother        skin  . Diabetes Paternal Grandmother   . Hypertension Paternal Grandmother   . Heart disease Paternal Grandmother   . Diabetes Paternal Grandfather   . Hypertension Paternal Grandfather   . Heart disease Paternal Grandfather     Allergies  Allergen Reactions  . Adhesive [Tape] Itching  . Ciprofloxacin Hives and Nausea And Vomiting    Current Outpatient Prescriptions on File Prior to Visit  Medication Sig Dispense Refill  . escitalopram (LEXAPRO) 20 MG tablet TAKE ONE TABLET BY MOUTH ONCE DAILY 90 tablet 0  . fluticasone (FLONASE) 50 MCG/ACT nasal spray Place 2 sprays into both nostrils daily. 16 g 6  . loratadine (CLARITIN) 10 MG tablet Take 10 mg by mouth daily.    . Melatonin 10 MG CAPS Take 1 tablet by mouth at bedtime.    . Norethindrone Acetate-Ethinyl Estrad-FE (LOESTRIN 24 FE) 1-20 MG-MCG(24) tablet Take 1 tablet by mouth daily. 1 Package 13   No current facility-administered medications on file prior to visit.     BP 122/82   Pulse 99  Temp 98.2 F (36.8 C) (Oral)   Ht 5\' 4"  (1.626 m)   Wt 227 lb 1.9 oz (103 kg)   LMP 09/28/2016   SpO2 98%   BMI 38.99 kg/m    Objective:   Physical Exam  Constitutional: She appears well-nourished. She does not appear ill.  HENT:  Right Ear: Ear canal normal. Tympanic membrane is bulging. Tympanic membrane is not erythematous.  Left Ear: Ear canal normal. Tympanic membrane is retracted. Tympanic membrane is not erythematous.  Nose: Mucosal edema present. Right sinus exhibits no maxillary sinus tenderness and no frontal sinus tenderness. Left sinus exhibits no maxillary sinus tenderness and no frontal sinus tenderness.  Mouth/Throat: Oropharynx is clear and moist.  Eyes: Conjunctivae are normal.  Neck: Neck  supple.  Cardiovascular: Normal rate and regular rhythm.   Pulmonary/Chest: Effort normal and breath sounds normal. She has no wheezes. She has no rales.  Lymphadenopathy:    She has no cervical adenopathy.  Skin: Skin is warm and dry.          Assessment & Plan:  URI:  Cough, congestion, sinus pressure x 5 days. Some improvement with OTC treatment. Exam today without evidence of bacterial involvement. Does not appear sickly.  Suspect viral involvement and will treat with conservative measures. Rx for Tessalon Pearls sent to pharmacy. Continue Flonase. Fluids, rest, follow up PRN.  Morrie Sheldonlark,Loretta Ellerby Kendal, NP

## 2016-12-07 ENCOUNTER — Telehealth: Payer: Self-pay | Admitting: *Deleted

## 2016-12-07 NOTE — Telephone Encounter (Signed)
Patient left a voicemail stating that she just found out that she is [redacted] weeks pregnant. Patient stated that she is concerned about being on Lexapro. Patient wants to know if there are other options or should she taper off of this? Patient stated that her OB appointment is not until 12/24/16. [facility]acy [facility]

## 2016-12-07 NOTE — Telephone Encounter (Signed)
It is best to taper off it. She should take 1/2 tablet daily x 1 week, 1/2 tablet every other day x 1 week, 1/2 tablet every 3 days x 1 week, then stop

## 2016-12-07 NOTE — Telephone Encounter (Signed)
Patient notified as instructed by telephone and verbalized understanding. 

## 2016-12-23 ENCOUNTER — Telehealth: Payer: Self-pay

## 2016-12-23 ENCOUNTER — Ambulatory Visit: Payer: Managed Care, Other (non HMO) | Admitting: Family Medicine

## 2016-12-23 NOTE — Telephone Encounter (Signed)
Pt's hsb, Caryn BeeKevin, calling, pt has appt tomorrow, has a very sore throat, went to Urgent Care, ?flu, was given antibx even though preg. What to do?  [phone]  Adv that technically I wasn't supposed to adv d/t we haven't seen pt before.  C/o phlem causes cough causes vomiting.  Fever has been as high as 101.  Has taken tylenol and it has come down.  Adv plain robitussin for cough, e.s. Tylenol two q4-6hrs for aches, pain, fever, warm salt water gargles as many times a day as she wants to, push fluids and rest.  Caryn BeeKevin states that is the hardest part d/t her being strong headed.  To keep appt tomorrow and to ask for a mask.

## 2016-12-24 ENCOUNTER — Ambulatory Visit (INDEPENDENT_AMBULATORY_CARE_PROVIDER_SITE_OTHER): Payer: Managed Care, Other (non HMO) | Admitting: Obstetrics and Gynecology

## 2016-12-24 ENCOUNTER — Encounter: Payer: Self-pay | Admitting: Obstetrics and Gynecology

## 2016-12-24 VITALS — BP 122/78 | Wt 218.0 lb

## 2016-12-24 DIAGNOSIS — F329 Major depressive disorder, single episode, unspecified: Secondary | ICD-10-CM

## 2016-12-24 DIAGNOSIS — B952 Enterococcus as the cause of diseases classified elsewhere: Secondary | ICD-10-CM

## 2016-12-24 DIAGNOSIS — O99210 Obesity complicating pregnancy, unspecified trimester: Secondary | ICD-10-CM

## 2016-12-24 DIAGNOSIS — O99211 Obesity complicating pregnancy, first trimester: Secondary | ICD-10-CM

## 2016-12-24 DIAGNOSIS — O0991 Supervision of high risk pregnancy, unspecified, first trimester: Secondary | ICD-10-CM

## 2016-12-24 DIAGNOSIS — F32A Depression, unspecified: Secondary | ICD-10-CM

## 2016-12-24 DIAGNOSIS — O0990 Supervision of high risk pregnancy, unspecified, unspecified trimester: Secondary | ICD-10-CM

## 2016-12-24 DIAGNOSIS — N390 Urinary tract infection, site not specified: Secondary | ICD-10-CM

## 2016-12-24 DIAGNOSIS — O99340 Other mental disorders complicating pregnancy, unspecified trimester: Secondary | ICD-10-CM

## 2016-12-24 DIAGNOSIS — Z6837 Body mass index (BMI) 37.0-37.9, adult: Secondary | ICD-10-CM

## 2016-12-24 DIAGNOSIS — F419 Anxiety disorder, unspecified: Secondary | ICD-10-CM

## 2016-12-24 HISTORY — DX: Supervision of high risk pregnancy, unspecified, unspecified trimester: O09.90

## 2016-12-24 HISTORY — DX: Obesity complicating pregnancy, unspecified trimester: O99.210

## 2016-12-24 HISTORY — DX: Other mental disorders complicating pregnancy, unspecified trimester: O99.340

## 2016-12-24 HISTORY — DX: Depression, unspecified: F32.A

## 2016-12-24 NOTE — Progress Notes (Signed)
New Obstetric Patient H&P   Chief Complaint: "Desires prenatal care"   History of Present Illness: Patient is a 31 y.o. G2P1001 Not Hispanic or Latino female, LMP 10/19/16 presents with amenorrhea and positive home pregnancy test. Based on her  LMP, her EDD is Estimated Date of Delivery: 07/26/17 and her EGA is 7179w3d. Cycles are 5. days, regular, and occur approximately every : 28 days. Her last pap smear was about 3 years ago and was no abnormalities.    She had a urine pregnancy test which was positive 3 week(s)  ago. Her last menstrual period was normal and lasted for  5 day(s). Since her LMP she claims she has experienced cold-like symptoms and was started on amoxicillin. She denies vaginal bleeding. Her past medical history is notable for obesity and depression. She was taking lexapro when she found out she was pregnant and has tapered off the medication. States she is doing well so far. Her prior pregnancies are notable for oligohydramnios around the time of her due date requring labor induction  Since her LMP, she admits to the use of tobacco products  no She claims she has gained   no pounds since the start of her pregnancy.  There are cats in the home in the home   She admits close contact with children on a regular basis  yes  She has had chicken pox in the past yes She has had Tuberculosis exposures, symptoms, or previously tested positive for TB   no Current or past history of domestic violence. no  Genetic Screening/Teratology Counseling: (Includes patient, baby's father, or anyone in either family with:)   1. [facility]'s [facility]/= 3835 [facility]  no 2. Thalassemia ([place], [place], Mediterranean, or Asian background): MCV<80  no 3. Neural tube defect (meningomyelocele, spina bifida, anencephaly)  no 4. Congenital heart defect  no  5. Down syndrome  no 6. Tay-Sachs (Jewish, [place])  no 7. Canavan's Disease  no 8. Sickle cell disease or trait (African)  no  9. Hemophilia or other  blood disorders  no  10. Muscular dystrophy  no  11. Cystic fibrosis  no  12. Huntington's Chorea  no  13. Mental retardation/autism  no 14. Other inherited genetic or chromosomal disorder  no 15. Maternal metabolic disorder (DM, PKU, etc)  no 16. Patient or FOB with a child with a birth defect not listed above no  16a. Patient or FOB with a birth defect themselves no 17. Recurrent pregnancy loss, or stillbirth  no  18. Any medications since LMP other than prenatal vitamins (include vitamins, supplements, OTC meds, drugs, alcohol)  no 19. Any other genetic/environmental exposure to discuss  no  Infection History:   1. Lives with someone with TB or TB exposed  no  2. Patient or partner has history of genital herpes  no 3. Rash or viral illness since LMP  no 4. History of STI (GC, CT, HPV, syphilis, HIV)  no 5. History of recent travel :  no  Other pertinent information:  no   Review of Systems:10 point review of systems negative unless otherwise noted in HPI  Past Medical History:  Diagnosis Date  . Allergy    seasonal  . Anemia   . Cholelithiasis   . Depression   . GERD (gastroesophageal reflux disease)   . Insomnia   . Panic attack     Past Surgical History:  Procedure Laterality Date  . CHOLECYSTECTOMY N/A 11/28/2014   Procedure: LAPAROSCOPIC CHOLECYSTECTOMY WITH INTRAOPERATIVE CHOLANGIOGRAM;  Surgeon: Lattie Haw, MD;  Location: [facility] ORS;  Service: General;  Laterality: N/A;  . WISDOM TOOTH EXTRACTION     x4    Gynecologic History: Patient's last menstrual period was 10/18/2016.  Obstetric History: G2P1001  Family History  Problem Relation Age of Onset  . Diabetes Mother   . Hypertension Mother   . Depression Mother   . Diabetes Maternal Grandmother   . Hypertension Maternal Grandmother   . Heart disease Maternal Grandmother   . Cancer Maternal Grandmother        skin  . Diabetes Paternal Grandmother   . Hypertension Paternal Grandmother   . Heart  disease Paternal Grandmother   . Diabetes Paternal Grandfather   . Hypertension Paternal Grandfather   . Heart disease Paternal Grandfather     Social History   Social History  . Marital status: Married    Spouse name: N/A  . Number of children: N/A  . Years of education: N/A   Occupational History  . Not on file.   Social History Main Topics  . Smoking status: Never Smoker  . Smokeless tobacco: Never Used  . Alcohol use No  . Drug use: No  . Sexual activity: Yes    Partners: Male    Birth control/ protection: None   Other Topics Concern  . Not on file   Social History Narrative   Married.   1 daughter.   Works at [employer] as a Lawyer.   Enjoys crafting, spending time with her daughter    Allergies  Allergen Reactions  . Adhesive [Tape] Itching  . Ciprofloxacin Hives and Nausea And Vomiting    Prior to Admission medications   Medication Sig Start Date End Date Taking? Authorizing Provider  benzonatate (TESSALON) 200 MG capsule Take 1 capsule (200 mg total) by mouth 3 (three) times daily as needed for cough. 10/05/16  Yes Doreene Nest, NP  fluticasone (FLONASE) 50 MCG/ACT nasal spray Place 2 sprays into both nostrils daily. 12/11/15  Yes Emi Belfast, FNP  loratadine (CLARITIN) 10 MG tablet Take 10 mg by mouth daily.    [provider]  Melatonin 10 MG CAPS Take 1 tablet by mouth at bedtime.    [provider]    Physical Exam BP 122/78   Wt 218 lb (98.9 kg)   LMP 10/18/2016   BMI 37.42 kg/m   Physical Exam  Constitutional: She is oriented to person, place, and time. She appears well-developed and well-nourished. No distress.  HENT:  Head: Normocephalic and atraumatic.  Eyes: Conjunctivae are normal.  Neck: Normal range of motion. Neck supple. No thyromegaly present.  Cardiovascular: Normal rate, regular rhythm and normal heart sounds.  Exam reveals no gallop and no friction rub.   No murmur heard. Pulmonary/Chest:  Effort normal and breath sounds normal. She has no wheezes.  Abdominal: Soft. She exhibits no distension and no mass. There is no tenderness. There is no rebound and no guarding. No hernia. Hernia confirmed negative in the right inguinal area and confirmed negative in the left inguinal area.  Genitourinary: Vagina normal. Pelvic exam was performed with patient supine. There is no rash, tenderness or lesion on the right labia. There is no rash, tenderness or lesion on the left labia. Uterus is enlarged (8-9 weeks). Uterus is not deviated and not tender. Cervix exhibits no motion tenderness and no discharge. Right adnexum displays no mass and no tenderness. Left adnexum displays no mass and no tenderness. No erythema, tenderness  or bleeding in the vagina. No signs of injury around the vagina. No vaginal discharge found.  Musculoskeletal: Normal range of motion.  Lymphadenopathy:    She has no cervical adenopathy.       Right: No inguinal adenopathy present.       Left: No inguinal adenopathy present.  Neurological: She is alert and oriented to person, place, and time.  Skin: Skin is warm and dry. No rash noted.  Psychiatric: She has a normal mood and affect. Her behavior is normal. Judgment normal.    Female Chaperone present during breast and/or pelvic exam.   Assessment: 31 y.o. G2P1001 at [redacted]w[redacted]d presenting to initiate prenatal care  Plan: 1) Avoid alcoholic beverages. 2) Patient encouraged not to smoke.  3) Discontinue the use of all non-medicinal drugs and chemicals.  4) Take prenatal vitamins daily.  5) Nutrition, food safety (fish, cheese advisories, and high nitrite foods) and exercise discussed. 6) Hospital and practice style discussed with cross coverage system.  7) Genetic Screening, such as with 1st Trimester Screening, cell free fetal DNA, AFP testing, and Ultrasound, as well as with amniocentesis and CVS as appropriate, is discussed with patient. At the conclusion of today's visit  patient declined genetic testing 8) Patient is asked about travel to areas at risk for the [place] virus, and counseled to avoid travel and exposure to mosquitoes or sexual partners who may have themselves been exposed to the virus. Testing is discussed, and will be ordered as appropriate.  9) Discussed depression in pregnancy.  Discussed risks of taking versus not taking medication, if necessary. Will continue to monitor.   10) obesity: early 1 hour glucose tolerance test  Thomasene Mohair, MD 12/24/2016 10:02 AM

## 2016-12-26 LAB — URINE CULTURE

## 2016-12-28 LAB — IGP, CTNG, RFX APTIMA HPV ASCU
Chlamydia, Nuc. Acid Amp: NEGATIVE
Gonococcus by Nucleic Acid Amp: NEGATIVE
PAP Smear Comment: 0

## 2016-12-31 ENCOUNTER — Ambulatory Visit (INDEPENDENT_AMBULATORY_CARE_PROVIDER_SITE_OTHER): Payer: Managed Care, Other (non HMO) | Admitting: Advanced Practice Midwife

## 2016-12-31 ENCOUNTER — Other Ambulatory Visit: Payer: Self-pay | Admitting: Obstetrics and Gynecology

## 2016-12-31 ENCOUNTER — Other Ambulatory Visit: Payer: Managed Care, Other (non HMO)

## 2016-12-31 ENCOUNTER — Ambulatory Visit (INDEPENDENT_AMBULATORY_CARE_PROVIDER_SITE_OTHER): Payer: Managed Care, Other (non HMO)

## 2016-12-31 VITALS — BP 110/54 | Wt 220.0 lb

## 2016-12-31 DIAGNOSIS — O99211 Obesity complicating pregnancy, first trimester: Secondary | ICD-10-CM | POA: Diagnosis not present

## 2016-12-31 DIAGNOSIS — O0991 Supervision of high risk pregnancy, unspecified, first trimester: Secondary | ICD-10-CM

## 2016-12-31 DIAGNOSIS — Z3A01 Less than 8 weeks gestation of pregnancy: Secondary | ICD-10-CM

## 2016-12-31 NOTE — Progress Notes (Signed)
EDD adjusted by today's dating scan, 6838w1d. Patient admits mild nausea. She declines genetic screening. Unable to see results while patient was here. Will review remainder of results at [facility].

## 2017-01-04 ENCOUNTER — Encounter: Payer: Self-pay | Admitting: Obstetrics and Gynecology

## 2017-01-04 DIAGNOSIS — B952 Enterococcus as the cause of diseases classified elsewhere: Secondary | ICD-10-CM | POA: Insufficient documentation

## 2017-01-04 DIAGNOSIS — N390 Urinary tract infection, site not specified: Secondary | ICD-10-CM | POA: Insufficient documentation

## 2017-01-04 HISTORY — DX: Urinary tract infection, site not specified: N39.0

## 2017-01-04 HISTORY — DX: Enterococcus as the cause of diseases classified elsewhere: B95.2

## 2017-01-04 MED ORDER — NITROFURANTOIN MONOHYD MACRO 100 MG PO CAPS: 100.0000 mg | ORAL_CAPSULE | Freq: Two times a day (BID) | ORAL | 1 refills | Status: DC

## 2017-01-04 NOTE — Addendum Note (Signed)
Addended by: Thomasene Mohair D on: 01/04/2017 06:50 PM   Modules accepted: Orders

## 2017-01-05 ENCOUNTER — Ambulatory Visit (INDEPENDENT_AMBULATORY_CARE_PROVIDER_SITE_OTHER): Payer: Managed Care, Other (non HMO) | Admitting: Family Medicine

## 2017-01-05 ENCOUNTER — Telehealth: Payer: Self-pay | Admitting: Obstetrics and Gynecology

## 2017-01-05 ENCOUNTER — Telehealth: Payer: Self-pay | Admitting: Primary Care

## 2017-01-05 ENCOUNTER — Encounter: Payer: Self-pay | Admitting: Family Medicine

## 2017-01-05 VITALS — BP 116/68 | HR 86 | Temp 98.1°F | Ht 64.0 in | Wt 214.5 lb

## 2017-01-05 DIAGNOSIS — R112 Nausea with vomiting, unspecified: Secondary | ICD-10-CM

## 2017-01-05 DIAGNOSIS — N3000 Acute cystitis without hematuria: Secondary | ICD-10-CM

## 2017-01-05 DIAGNOSIS — N390 Urinary tract infection, site not specified: Secondary | ICD-10-CM | POA: Insufficient documentation

## 2017-01-05 NOTE — Telephone Encounter (Signed)
Pt is calling today about her anabiotics and is requesting something for nausea. Please advise

## 2017-01-05 NOTE — Telephone Encounter (Signed)
Pt has appt 01/05/17 at 3 PM with Dr Milinda Antis.

## 2017-01-05 NOTE — Telephone Encounter (Signed)
Cb# is [phone].

## 2017-01-05 NOTE — Patient Instructions (Signed)
Get your macrobid from the pharmacy for your uti  No doubt this infection is adding to your nausea   Take sips of fluids - slowly and steadily  Bland diet-advance as tolerated   Call and email your obgyn office to see if they recommend medicine for nausea   If you develop signs/symptoms of dehydration- go to the ED (dry mouth /dry skin/rapid heart beat or dizziness

## 2017-01-05 NOTE — Assessment & Plan Note (Signed)
Diagnosed yesterday at obgyn -e coli  She was not aware  Adv she take the macrobid/inc fluid intake

## 2017-01-05 NOTE — Assessment & Plan Note (Signed)
Suspect multi factorial with early pregnancy and uti  I recommend taking the macrobid Disc dehydration prevention and need for fluids rest Work note for tomorrow  She will contact her obgyn for advisement re: any anti nausea medication  Reassuring exam

## 2017-01-05 NOTE — Telephone Encounter (Signed)
Patient Name: Loretta Burton  DOB: [birth year]    Initial Comment Caller states his wife has been having problems with nausea and diarrhea. It is starting to interfere with her work life. She is about 2 months pregnant    Nurse Assessment  Nurse: Scarlette Ar, RN, Heather Date/Time (Eastern Time): 01/05/2017 12:55:37 PM  Confirm and document reason for call. If symptomatic, describe symptoms. ---Caller states she has been having problems with nausea and diarrhea. It is starting to interfere with her work life. She is about 2 months pregnant. The nausea started on Thursday, vomiting on Saturday with diarrhea.  Does the patient have any new or worsening symptoms? ---Yes  Will a triage be completed? ---Yes  Related visit to physician within the last 2 weeks? ---No  Does the PT have any chronic conditions? (i.e. diabetes, asthma, etc.) ---Yes  List chronic conditions. ---depression  Is the patient pregnant or possibly pregnant? (Ask all females between the ages of 32-55) ---Yes  How many weeks gestation? ---8  What is the estimated delivery date? ---2016-08-19  Total number of pregnancies including current? ---2  Number of live births? ---1  Have you felt decreased fetal movement? ---Early Pregnancy - No Fetal Movement Felt Yet  Is this a behavioral health or substance abuse call? ---No     Guidelines    Guideline Title Affirmed Question Affirmed Notes  Vomiting [1] MILD or MODERATE vomiting AND [2] present > 48 hours (2 days) (Exception: mild vomiting with associated diarrhea)    Final Disposition User   See Physician within 24 Hours Standifer, RN, Research scientist (physical sciences)    Comments  Appt with Dr. Milinda Antis at 3 pm today.   Referrals  REFERRED TO PCP OFFICE   Disagree/Comply: Comply

## 2017-01-05 NOTE — Telephone Encounter (Signed)
I saw her in the office

## 2017-01-05 NOTE — Telephone Encounter (Signed)
Pt called nurse line at 3:28 stating she had UTI, went to primary, given antibiotic, is also n/v/d.  How will she take the antibx when she can't keep anything down?  Can she have rx for phenergan or something?  [phone]

## 2017-01-05 NOTE — Progress Notes (Signed)
Subjective:    Patient ID: Loretta Burton, female    DOB: Jun 03, 1985, 31 y.o.   MRN: [id number]  HPI 31 yo female with n/v/d   Is pregnant  Pt of NP Clark Had ccy in 2016   Was diagnosed with a bladder infection by Dr Jean Rosenthal (obgyn) yesterday and he emailed her - she had not seen it  cx pos for e coli  Was px macrobid   Is [redacted] weeks pregnant   Symptoms started Thursday-nausea (thought it was pregnancy)  Her daughter vomited once and husband had diarrhea once Thought a bug was going around  They are fine now   Very nauseated  Cannot eat  Keeping some fluids down and still urinating  Cannot keep broth down  Last vomiting was an hour ago   Some diarrhea - small very loose stools   No abd pain  No cramping  No abdominal bleeding  No blood in urine   No fever   She does not have dysuria (but she never does with utis)  Tends to have an atypical presentation  Back hurts- more in the R flank area   No cramping   Patient Active Problem List   Diagnosis Date Noted  . Nausea & vomiting 01/05/2017  . UTI (urinary tract infection) 01/05/2017  . UTI (urinary tract infection) due to Enterococcus 01/04/2017  . Supervision of high-risk pregnancy 12/24/2016  . Obesity affecting pregnancy 12/24/2016  . BMI 37.0-37.9, adult 12/24/2016  . Depression affecting pregnancy 12/24/2016  . Recurrent sinusitis 11/21/2015  . Insomnia 11/21/2015  . Anxiety and depression 01/17/2014   Past Medical History:  Diagnosis Date  . Allergy    seasonal  . Anemia   . Cholelithiasis   . Depression   . GERD (gastroesophageal reflux disease)   . Insomnia   . Panic attack    Past Surgical History:  Procedure Laterality Date  . CHOLECYSTECTOMY N/A 11/28/2014   Procedure: LAPAROSCOPIC CHOLECYSTECTOMY WITH INTRAOPERATIVE CHOLANGIOGRAM;  Surgeon: Lattie Haw, MD;  Location: [facility] ORS;  Service: General;  Laterality: N/A;  . WISDOM TOOTH EXTRACTION     x4   Social History  Substance  Use Topics  . Smoking status: Never Smoker  . Smokeless tobacco: Never Used  . Alcohol use No   Family History  Problem Relation Age of Onset  . Diabetes Mother   . Hypertension Mother   . Depression Mother   . Diabetes Maternal Grandmother   . Hypertension Maternal Grandmother   . Heart disease Maternal Grandmother   . Cancer Maternal Grandmother        skin  . Diabetes Paternal Grandmother   . Hypertension Paternal Grandmother   . Heart disease Paternal Grandmother   . Diabetes Paternal Grandfather   . Hypertension Paternal Grandfather   . Heart disease Paternal Grandfather    Allergies  Allergen Reactions  . Adhesive [Tape] Itching  . Ciprofloxacin Hives and Nausea And Vomiting   Current Outpatient Prescriptions on File Prior to Visit  Medication Sig Dispense Refill  . IRON PO Take by mouth.    . loratadine (CLARITIN) 10 MG tablet Take 10 mg by mouth daily.     No current facility-administered medications on file prior to visit.      Review of Systems  Constitutional: Positive for fatigue. Negative for activity change, appetite change, fever and unexpected weight change.  HENT: Negative for congestion, ear pain, rhinorrhea, sinus pressure and sore throat.   Eyes: Negative for pain, redness  and visual disturbance.  Respiratory: Negative for cough, shortness of breath and wheezing.   Cardiovascular: Negative for chest pain and palpitations.  Gastrointestinal: Negative for abdominal pain, blood in stool, constipation and diarrhea.  Endocrine: Negative for polydipsia and polyuria.  Genitourinary: Positive for frequency. Negative for dysuria, hematuria, pelvic pain, urgency, vaginal bleeding and vaginal discharge.  Musculoskeletal: Negative for arthralgias, back pain and myalgias.  Skin: Negative for pallor and rash.  Allergic/Immunologic: Negative for environmental allergies.  Neurological: Negative for dizziness, syncope and headaches.  Hematological: Negative for  adenopathy. Does not bruise/bleed easily.  Psychiatric/Behavioral: Negative for decreased concentration and dysphoric mood. The patient is not nervous/anxious.        Objective:   Physical Exam  Constitutional: She appears well-developed and well-nourished. No distress.  HENT:  Head: Normocephalic and atraumatic.  MMM  Eyes: Pupils are equal, round, and reactive to light. Conjunctivae and EOM are normal.  Neck: Normal range of motion. Neck supple.  Cardiovascular: Normal rate, regular rhythm and normal heart sounds.   Pulmonary/Chest: Effort normal and breath sounds normal.  Abdominal: Soft. Bowel sounds are normal. She exhibits no distension. There is tenderness. There is no rebound.  Mild tenderness of flank area/cva -bilat but worse on the R   Mild suprapubic tenderness  Musculoskeletal: She exhibits no edema.  Lymphadenopathy:    She has no cervical adenopathy.  Neurological: She is alert.  Skin: No rash noted. No pallor.  Nl skin color and turgor  Psychiatric: She has a normal mood and affect.          Assessment & Plan:   Problem List Items Addressed This Visit      Digestive   Nausea & vomiting    Suspect multi factorial with early pregnancy and uti  I recommend taking the macrobid Disc dehydration prevention and need for fluids rest Work note for tomorrow  She will contact her obgyn for advisement re: any anti nausea medication  Reassuring exam        Genitourinary   UTI (urinary tract infection)    Diagnosed yesterday at obgyn -e coli  She was not aware  Adv she take the macrobid/inc fluid intake

## 2017-01-06 ENCOUNTER — Other Ambulatory Visit: Payer: Self-pay | Admitting: Obstetrics and Gynecology

## 2017-01-06 ENCOUNTER — Emergency Department
Admission: EM | Admit: 2017-01-06 | Discharge: 2017-01-06 | Disposition: A | Discharge status: Home / Self Care | Payer: Managed Care, Other (non HMO) | Attending: Emergency Medicine | Admitting: Emergency Medicine

## 2017-01-06 ENCOUNTER — Encounter: Payer: Self-pay | Admitting: Obstetrics and Gynecology

## 2017-01-06 DIAGNOSIS — Z3A08 8 weeks gestation of pregnancy: Secondary | ICD-10-CM | POA: Diagnosis not present

## 2017-01-06 DIAGNOSIS — O210 Mild hyperemesis gravidarum: Secondary | ICD-10-CM | POA: Diagnosis not present

## 2017-01-06 DIAGNOSIS — Z79899 Other long term (current) drug therapy: Secondary | ICD-10-CM | POA: Diagnosis not present

## 2017-01-06 MED ORDER — PROMETHAZINE HCL 12.5 MG PO TABS: 12.5000 mg | ORAL_TABLET | Freq: Four times a day (QID) | ORAL | 0 refills | Status: DC | PRN

## 2017-01-06 MED ORDER — SODIUM CHLORIDE 0.9 % IV BOLUS (SEPSIS)
1000.0000 mL | Freq: Once | INTRAVENOUS | Status: AC
  Administered 2017-01-06: 1000 mL via INTRAVENOUS

## 2017-01-06 MED ORDER — PROMETHAZINE HCL 25 MG/ML IJ SOLN
25.0000 mg | Freq: Once | INTRAMUSCULAR | Status: AC
  Administered 2017-01-06: 25 mg via INTRAVENOUS
  Filled 2017-01-06: qty 1

## 2017-01-06 NOTE — Telephone Encounter (Signed)
Please let patient know.

## 2017-01-06 NOTE — Telephone Encounter (Signed)
Pt aware via vm, also had let her know through [employer] that another provider would be sending in something

## 2017-01-06 NOTE — ED Triage Notes (Signed)
Pt states has been vomiting since last Thursday was started on antibiotics yesterday but states not able to keep meds down.

## 2017-01-06 NOTE — Telephone Encounter (Signed)
Please advise 

## 2017-01-06 NOTE — ED Provider Notes (Signed)
[facility] Emergency Department Provider Note   First MD Initiated Contact with Patient 01/06/17 [phone]     (approximate)  I have reviewed the triage vital signs and the nursing notes.   HISTORY  Chief Complaint Emesis    HPI Loretta Burton is a 31 y.o. female G2 P1 approximately [redacted] weeks pregnant presents withnonbloody emesis since last week Thursday. Patient states that she was notified yesterday that she had a urinary tract infection and started on Macrobid however she is unable to tolerate taking it secondary to an vomiting. Patient states that shehad Zofran at home from her previous pregnancy which she took without any relief. Patient admits to dizziness with positional change.   Past Medical History:  Diagnosis Date  . Allergy    seasonal  . Anemia   . Cholelithiasis   . Depression   . GERD (gastroesophageal reflux disease)   . Insomnia   . Panic attack     Patient Active Problem List   Diagnosis Date Noted  . Nausea & vomiting 01/05/2017  . UTI (urinary tract infection) 01/05/2017  . UTI (urinary tract infection) due to Enterococcus 01/04/2017  . Supervision of high-risk pregnancy 12/24/2016  . Obesity affecting pregnancy 12/24/2016  . BMI 37.0-37.9, adult 12/24/2016  . Depression affecting pregnancy 12/24/2016  . Recurrent sinusitis 11/21/2015  . Insomnia 11/21/2015  . Anxiety and depression 01/17/2014    Past Surgical History:  Procedure Laterality Date  . CHOLECYSTECTOMY N/A 11/28/2014   Procedure: LAPAROSCOPIC CHOLECYSTECTOMY WITH INTRAOPERATIVE CHOLANGIOGRAM;  Surgeon: Lattie Haw, MD;  Location: [facility] ORS;  Service: General;  Laterality: N/A;  . WISDOM TOOTH EXTRACTION     x4    Prior to Admission medications   Medication Sig Start Date End Date Taking? Authorizing Provider  IRON PO Take by mouth.    [provider]  loratadine (CLARITIN) 10 MG tablet Take 10 mg by mouth daily.    [provider]     Allergies Adhesive [tape] and Ciprofloxacin  Family History  Problem Relation Age of Onset  . Diabetes Mother   . Hypertension Mother   . Depression Mother   . Diabetes Maternal Grandmother   . Hypertension Maternal Grandmother   . Heart disease Maternal Grandmother   . Cancer Maternal Grandmother        skin  . Diabetes Paternal Grandmother   . Hypertension Paternal Grandmother   . Heart disease Paternal Grandmother   . Diabetes Paternal Grandfather   . Hypertension Paternal Grandfather   . Heart disease Paternal Grandfather     Social History Social History  Substance Use Topics  . Smoking status: Never Smoker  . Smokeless tobacco: Never Used  . Alcohol use No    Review of Systems Constitutional: No fever/chills Eyes: No visual changes. ENT: No sore throat. Cardiovascular: Denies chest pain. Respiratory: Denies shortness of breath. Gastrointestinal: No abdominal pain. positive for nausea andvomiting.  No diarrhea.  No constipation. Genitourinary: Negative for dysuria. Musculoskeletal: Negative for neck pain.  Negative for back pain. Integumentary: Negative for rash. Neurological: Negative for headaches, focal weakness or numbness.   ____________________________________________   PHYSICAL EXAM:  VITAL SIGNS: ED Triage Vitals [01/06/17 0559]  Enc Vitals Group     BP 133/72     Pulse Rate 89     Resp 20     Temp (!) 97.5 F (36.4 C)     Temp Source Oral     SpO2 99 %  Weight 97.1 kg (214 lb)     Height 1.626 m ( )     Head Circumference      Peak Flow      Pain Score      Pain Loc      Pain Edu?      Excl. in GC?     Constitutional: Alert and oriented. Well appearing and in no acute distress. Eyes: Conjunctivae are normal.  Head: Atraumatic. Mouth/Throat: Mucous membranes are moist.  Oropharynx non-erythematous. Neck: No stridor.   Cardiovascular: Normal rate, regular rhythm. Good peripheral circulation. Grossly normal heart  sounds. Respiratory: Normal respiratory effort.  No retractions. Lungs CTAB. Gastrointestinal: Soft and nontender. No distention.  Musculoskeletal: No lower extremity tenderness nor edema. No gross deformities of extremities. Neurologic:  Normal speech and language. No gross focal neurologic deficits are appreciated.  Skin:  Skin is warm, dry and intact. No rash noted. Psychiatric: Mood and affect are normal. Speech and behavior are normal.  ____________________________________________   LABS (all labs ordered are listed, but only abnormal results are displayed)  Labs Reviewed - No data to display ____________________________________________  Procedures   ____________________________________________   INITIAL IMPRESSION / ASSESSMENT AND PLAN / ED COURSE  Pertinent labs & imaging results that were available during my care of the patient were reviewed by me and considered in my medical decision making (see chart for details).  31 year old female presenting with history & physical exam consistent with hyperemesis gravidarum. Patient given Phenergan 25 mg IV and 2 L IV normal saline. Patient's care transferred to Dr. Scotty Court for evaluation with anticipation to be discharged home with Phenergan.      ____________________________________________  FINAL CLINICAL IMPRESSION(S) / ED DIAGNOSES  Final diagnoses:  Hyperemesis gravidarum     MEDICATIONS GIVEN DURING THIS VISIT:  Medications  sodium chloride 0.9 % bolus 1,000 mL (not administered)  sodium chloride 0.9 % bolus 1,000 mL (not administered)  promethazine (PHENERGAN) injection 25 mg (not administered)     NEW OUTPATIENT MEDICATIONS STARTED DURING THIS VISIT:  New Prescriptions   No medications on file    Modified Medications   No medications on file    Discontinued Medications   No medications on file     Note:  This document was prepared using Dragon voice recognition software and may include  unintentional dictation errors.    Darci Current, MD 01/06/17 [phone]

## 2017-01-06 NOTE — Telephone Encounter (Signed)
Ive sent rx for phenergan

## 2017-01-11 ENCOUNTER — Other Ambulatory Visit: Payer: Self-pay | Admitting: Obstetrics and Gynecology

## 2017-01-11 MED ORDER — PROMETHAZINE HCL 12.5 MG PO TABS: 12.5000 mg | ORAL_TABLET | Freq: Four times a day (QID) | ORAL | 0 refills | Status: DC | PRN

## 2017-01-20 ENCOUNTER — Ambulatory Visit (INDEPENDENT_AMBULATORY_CARE_PROVIDER_SITE_OTHER): Payer: Managed Care, Other (non HMO) | Admitting: Primary Care

## 2017-01-20 ENCOUNTER — Encounter: Payer: Self-pay | Admitting: Primary Care

## 2017-01-20 VITALS — BP 108/60 | HR 88 | Temp 98.2°F | Ht 64.0 in | Wt 216.8 lb

## 2017-01-20 DIAGNOSIS — R109 Unspecified abdominal pain: Secondary | ICD-10-CM

## 2017-01-20 DIAGNOSIS — R42 Dizziness and giddiness: Secondary | ICD-10-CM | POA: Diagnosis not present

## 2017-01-20 LAB — POC URINALSYSI DIPSTICK (AUTOMATED)
Bilirubin, UA: NEGATIVE
Glucose, UA: NEGATIVE
Ketones, UA: NEGATIVE
LEUKOCYTES UA: NEGATIVE
NITRITE UA: NEGATIVE
PROTEIN UA: NEGATIVE
Spec Grav, UA: >=1.03 — AB (ref 1.010–1.025)
UROBILINOGEN UA: 0.2 U/dL
pH, UA: 6 (ref 5.0–8.0)

## 2017-01-20 NOTE — Addendum Note (Signed)
Addended by: Tawnya Crook on: 01/20/2017 04:15 PM   Modules accepted: Orders

## 2017-01-20 NOTE — Patient Instructions (Addendum)
Complete lab work prior to leaving today. I will notify you of your results once received.   Stop by the front desk and speak with either Shirlee Limerick or Zella Ball regarding your ultrasound. I'll be in touch once I receive your results.  Ensure you are consuming 64 ounces of water daily.  Continue phenergan as needed for nausea.  It was a pleasure to see you today!

## 2017-01-20 NOTE — Progress Notes (Signed)
Subjective:    Patient ID: Loretta Burton, female    DOB: 04/27/1985, 31 y.o.   MRN: [id number]  HPI  Loretta Burton is a 31 year old female who presents today with a chief complaint of hypotension and dizziness. Her BP in the office today is 108/60. She is [redacted] weeks pregnant.   She's been checking her BP at home and is getting readings of low 100's/mid 50's-60's. This morning her blood pressure was 130/80. Over the past 2 months she had a viral cold, the stomach bug, strep throat, sinusitis, and UTI.   Her dizziness started three weeks ago with increased dizziness this week. She's noticed dizziness intermittently throughout the day, sometimes at rest, sometimes while driving, sometimes when changing positions. She describes her dizziness as the "room is spinning". She is vomiting once to twice daily but is working to drink fluids and take phenergan. She's also continued to experience diarrhea that began 3 weeks ago which has improved. She is now having one episode of diarrhea daily. She has continued to notice some dysuria and low back pain. Low grade fevers. Residual cough which is improving. She did complete the Macrobid antibiotics that were prescribed per her GYN.  She's been trying to stay hydrated with water and is eating bland foods. She denies abdominal pain, bloody stools.   Review of Systems  Constitutional: Positive for fatigue and fever.  Respiratory: Positive for cough.   Gastrointestinal: Positive for diarrhea, nausea and vomiting. Negative for abdominal pain and blood in stool.  Neurological: Positive for dizziness.       Past Medical History:  Diagnosis Date  . Allergy    seasonal  . Anemia   . Cholelithiasis   . Depression   . GERD (gastroesophageal reflux disease)   . Insomnia   . Panic attack      Social History   Social History  . Marital status: Married    Spouse name: N/A  . Number of children: N/A  . Years of education: N/A   Occupational History  .  Not on file.   Social History Main Topics  . Smoking status: Never Smoker  . Smokeless tobacco: Never Used  . Alcohol use No  . Drug use: No  . Sexual activity: Yes    Partners: Male    Birth control/ protection: None   Other Topics Concern  . Not on file   Social History Narrative   Married.   1 daughter.   Works at [employer] as a Lawyer.   Enjoys crafting, spending time with her daughter    Past Surgical History:  Procedure Laterality Date  . CHOLECYSTECTOMY N/A 11/28/2014   Procedure: LAPAROSCOPIC CHOLECYSTECTOMY WITH INTRAOPERATIVE CHOLANGIOGRAM;  Surgeon: Lattie Haw, MD;  Location: [facility] ORS;  Service: General;  Laterality: N/A;  . WISDOM TOOTH EXTRACTION     x4    Family History  Problem Relation Age of Onset  . Diabetes Mother   . Hypertension Mother   . Depression Mother   . Diabetes Maternal Grandmother   . Hypertension Maternal Grandmother   . Heart disease Maternal Grandmother   . Cancer Maternal Grandmother        skin  . Diabetes Paternal Grandmother   . Hypertension Paternal Grandmother   . Heart disease Paternal Grandmother   . Diabetes Paternal Grandfather   . Hypertension Paternal Grandfather   . Heart disease Paternal Grandfather     Allergies  Allergen Reactions  . Adhesive [Tape] Itching  .  Ciprofloxacin Hives and Nausea And Vomiting    Current Outpatient Prescriptions on File Prior to Visit  Medication Sig Dispense Refill  . loratadine (CLARITIN) 10 MG tablet Take 10 mg by mouth daily.    . promethazine (PHENERGAN) 12.5 MG tablet Take 1 tablet (12.5 mg total) by mouth every 6 (six) hours as needed for nausea or vomiting. 30 tablet 0   No current facility-administered medications on file prior to visit.     BP 108/60   Pulse 88   Temp 98.2 F (36.8 C) (Oral)   Ht  (1.626 m)   Wt 216 lb 12.8 oz (98.3 kg)   LMP 10/19/2016   SpO2 98%   BMI 37.21 kg/m    Objective:   Physical Exam  Constitutional: She appears  well-nourished.  Neck: Neck supple.  Cardiovascular: Normal rate and regular rhythm.   Pulmonary/Chest: Effort normal and breath sounds normal.  Abdominal: Soft. There is no tenderness.  Skin: Skin is warm and dry.          Assessment & Plan:  Dizziness:  Also with vomiting, diarrhea, low blood pressure readings, flank pain, dysuria. Suspect vomiting secondary to pregnancy, diarrhea from recent viral gastritis, cough from recent sinusitis, low grade fevers from pregnancy. Will check CBC, CMP, UA today to rule out other causes. Suspect dizziness secondary to dehydration from vomiting/diarrhea and/or renal stone.   UA: Negative for leuks and nitrites, trace blood.  Orthostatic vitals negative. [place] of kidneys and ureters ordered and pending, cannot have CT or xray due to pregnancy.  Push fluids, rest, await results.  Morrie Sheldon, NP

## 2017-01-21 LAB — CBC WITH DIFFERENTIAL/PLATELET
Basophils Absolute: 0.1 10*3/uL (ref 0.0–0.1)
Basophils Relative: 0.7 % (ref 0.0–3.0)
EOS PCT: 0.9 % (ref 0.0–5.0)
Eosinophils Absolute: 0.1 10*3/uL (ref 0.0–0.7)
HEMATOCRIT: 38.1 % (ref 36.0–46.0)
Hemoglobin: 12.4 g/dL (ref 12.0–15.0)
LYMPHS ABS: 2.5 10*3/uL (ref 0.7–4.0)
LYMPHS PCT: 26.2 % (ref 12.0–46.0)
MCHC: 32.7 g/dL (ref 30.0–36.0)
MCV: 91.7 fl (ref 78.0–100.0)
MONOS PCT: 8 % (ref 3.0–12.0)
Monocytes Absolute: 0.8 10*3/uL (ref 0.1–1.0)
NEUTROS PCT: 64.2 % (ref 43.0–77.0)
Neutro Abs: 6.1 10*3/uL (ref 1.4–7.7)
Platelets: 231 10*3/uL (ref 150.0–400.0)
RBC: 4.15 Mil/uL (ref 3.87–5.11)
RDW: 13.4 % (ref 11.5–15.5)
WBC: 9.6 10*3/uL (ref 4.0–10.5)

## 2017-01-21 LAB — COMPREHENSIVE METABOLIC PANEL
ALK PHOS: 61 U/L (ref 39–117)
ALT: 20 U/L (ref 0–35)
AST: 13 U/L (ref 0–37)
Albumin: 4.2 g/dL (ref 3.5–5.2)
BUN: 6 mg/dL (ref 6–23)
CALCIUM: 9.8 mg/dL (ref 8.4–10.5)
CO2: 25 meq/L (ref 19–32)
Chloride: 106 mEq/L (ref 96–112)
Creatinine, Ser: 0.48 mg/dL (ref 0.40–1.20)
GFR: 161.69 mL/min (ref >60.00)
GLUCOSE: 97 mg/dL (ref 70–99)
POTASSIUM: 4.4 meq/L (ref 3.5–5.1)
Sodium: 141 mEq/L (ref 135–145)
Total Bilirubin: 0.2 mg/dL (ref 0.2–1.2)
Total Protein: 7.4 g/dL (ref 6.0–8.3)

## 2017-01-26 ENCOUNTER — Ambulatory Visit: Payer: Managed Care, Other (non HMO)

## 2017-01-27 ENCOUNTER — Ambulatory Visit
Admission: RE | Admit: 2017-01-27 | Discharge: 2017-01-27 | Disposition: A | Discharge status: Home / Self Care | Payer: Managed Care, Other (non HMO) | Source: Ambulatory Visit | Attending: Primary Care | Admitting: Primary Care

## 2017-01-27 DIAGNOSIS — R109 Unspecified abdominal pain: Secondary | ICD-10-CM | POA: Diagnosis not present

## 2017-01-28 LAB — OB RESULTS CONSOLE HIV ANTIBODY (ROUTINE TESTING): HIV: NONREACTIVE

## 2017-01-28 LAB — OB RESULTS CONSOLE HEPATITIS B SURFACE ANTIGEN: HEP B S AG: NEGATIVE

## 2017-01-28 LAB — OB RESULTS CONSOLE VARICELLA ZOSTER ANTIBODY, IGG: VARICELLA IGG: IMMUNE

## 2017-01-28 LAB — OB RESULTS CONSOLE RUBELLA ANTIBODY, IGM: RUBELLA: IMMUNE

## 2017-01-29 ENCOUNTER — Encounter: Payer: Managed Care, Other (non HMO) | Admitting: Obstetrics and Gynecology

## 2017-02-02 ENCOUNTER — Emergency Department
Admission: EM | Admit: 2017-02-02 | Discharge: 2017-02-02 | Disposition: A | Discharge status: Home / Self Care | Payer: Worker's Compensation | Attending: Emergency Medicine | Admitting: Emergency Medicine

## 2017-02-02 ENCOUNTER — Encounter: Payer: Self-pay | Admitting: Emergency Medicine

## 2017-02-02 ENCOUNTER — Emergency Department: Payer: Worker's Compensation

## 2017-02-02 DIAGNOSIS — Y9389 Activity, other specified: Secondary | ICD-10-CM | POA: Insufficient documentation

## 2017-02-02 DIAGNOSIS — S9031XA Contusion of right foot, initial encounter: Secondary | ICD-10-CM | POA: Diagnosis not present

## 2017-02-02 DIAGNOSIS — Z79899 Other long term (current) drug therapy: Secondary | ICD-10-CM | POA: Diagnosis not present

## 2017-02-02 DIAGNOSIS — Y929 Unspecified place or not applicable: Secondary | ICD-10-CM | POA: Diagnosis not present

## 2017-02-02 DIAGNOSIS — O9A211 Injury, poisoning and certain other consequences of external causes complicating pregnancy, first trimester: Secondary | ICD-10-CM | POA: Insufficient documentation

## 2017-02-02 DIAGNOSIS — Y990 Civilian activity done for income or pay: Secondary | ICD-10-CM | POA: Diagnosis not present

## 2017-02-02 DIAGNOSIS — M79671 Pain in right foot: Secondary | ICD-10-CM

## 2017-02-02 DIAGNOSIS — Z3A12 12 weeks gestation of pregnancy: Secondary | ICD-10-CM | POA: Insufficient documentation

## 2017-02-02 DIAGNOSIS — W240XXA Contact with lifting devices, not elsewhere classified, initial encounter: Secondary | ICD-10-CM | POA: Diagnosis not present

## 2017-02-02 MED ORDER — ACETAMINOPHEN 325 MG PO TABS
650.0000 mg | ORAL_TABLET | Freq: Once | ORAL | Status: AC
  Administered 2017-02-02: 650 mg via ORAL

## 2017-02-02 MED ORDER — ACETAMINOPHEN 325 MG PO TABS
ORAL_TABLET | ORAL | Status: AC
  Filled 2017-02-02: qty 2

## 2017-02-02 NOTE — ED Triage Notes (Signed)
Pt reports she was at work moving a pt with a mechanical lift when the pt fell onto the lift bar and the bar and the weight of pt landed on pts right foot. Pts right foot and the third toe are painful, red and swollen. Pt is approximately [redacted] weeks pregnant.

## 2017-02-02 NOTE — Discharge Instructions (Signed)
No fracture was noted on x-ray. Your foot has sustained bruising and swelling from the injury. I recommend taking Tylenol as needed for pain. He may also apply a cold pack, elevate the extremity and apply an Ace wrap for compression to reduce swelling.

## 2017-02-02 NOTE — ED Provider Notes (Signed)
[facility] Emergency Department Provider Note   ____________________________________________   I have reviewed the triage vital signs and the nursing notes.   HISTORY  Chief Complaint Foot Injury    HPI Loretta Burton is a 31 y.o. female % Emergency department with right foot and toe pain after a mechanical left fell on her foot at work today. 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 Patient noted pain, swelling and difficulty with weightbearing through that portion of her foot. Patient denies any past injury to the foot. Patient is currently [redacted] weeks pregnant and thus far a healthy pregnancy. Patient denies fever, chills, headache, vision changes, chest pain, chest tightness, shortness of breath, abdominal pain, nausea and vomiting.  Past Medical History:  Diagnosis Date  . Allergy    seasonal  . Anemia   . Cholelithiasis   . Depression   . GERD (gastroesophageal reflux disease)   . Insomnia   . Panic attack     Patient Active Problem List   Diagnosis Date Noted  . Nausea & vomiting 01/05/2017  . UTI (urinary tract infection) 01/05/2017  . UTI (urinary tract infection) due to Enterococcus 01/04/2017  . Supervision of high-risk pregnancy 12/24/2016  . Obesity affecting pregnancy 12/24/2016  . BMI 37.0-37.9, adult 12/24/2016  . Depression affecting pregnancy 12/24/2016  . Recurrent sinusitis 11/21/2015  . Insomnia 11/21/2015  . Anxiety and depression 01/17/2014    Past Surgical History:  Procedure Laterality Date  . CHOLECYSTECTOMY N/A 11/28/2014   Procedure: LAPAROSCOPIC CHOLECYSTECTOMY WITH INTRAOPERATIVE CHOLANGIOGRAM;  Surgeon: Lattie Haw, MD;  Location: [facility] ORS;  Service: General;  Laterality: N/A;  . WISDOM TOOTH EXTRACTION     x4    Prior to Admission medications   Medication Sig Start Date End Date Taking? Authorizing Provider  loratadine  (CLARITIN) 10 MG tablet Take 10 mg by mouth daily.    [provider]  promethazine (PHENERGAN) 12.5 MG tablet Take 1 tablet (12.5 mg total) by mouth every 6 (six) hours as needed for nausea or vomiting. 01/11/17   Vena Austria, MD    Allergies Adhesive [tape] and Ciprofloxacin  Family History  Problem Relation Age of Onset  . Diabetes Mother   . Hypertension Mother   . Depression Mother   . Diabetes Maternal Grandmother   . Hypertension Maternal Grandmother   . Heart disease Maternal Grandmother   . Cancer Maternal Grandmother        skin  . Diabetes Paternal Grandmother   . Hypertension Paternal Grandmother   . Heart disease Paternal Grandmother   . Diabetes Paternal Grandfather   . Hypertension Paternal Grandfather   . Heart disease Paternal Grandfather     Social History Social History  Substance Use Topics  . Smoking status: Never Smoker  . Smokeless tobacco: Never Used  . Alcohol use No    Review of Systems Constitutional: Negative for fever/chills Respiratory: Denies cough. Denies shortness of breath. Musculoskeletal: positive for right foot pain. Skin: Negative for rash. Neurological: Negative for headaches.  ____________________________________________   PHYSICAL EXAM:  VITAL SIGNS: ED Triage Vitals  Enc Vitals Group     BP 02/02/17 1925 124/70     Pulse Rate 02/02/17 1925 (!) 106     Resp 02/02/17 1925 20     Temp 02/02/17 1925 98.6 F (37 C)     Temp Source 02/02/17 1925 Oral  SpO2 02/02/17 1925 96 %     Weight 02/02/17 1915 216 lb (98 kg)     Height 02/02/17 1925  (1.626 m)     Head Circumference --      Peak Flow --      Pain Score --      Pain Loc --      Pain Edu? --      Excl. in GC? --     Constitutional: Alert and oriented. Well appearing and in no acute distress.  Cardiovascular: Normal rate, regular rhythm.  Respiratory: Normal respiratory effort without tachypnea or retractions. Musculoskeletal: Right ankle  range of motion all planes intact. Right foot motion intact limited by pain. Visible swelling noted along the distal aspect of the metatarsal bones. Mild ecchymosis over the toes. Intact sensation along the entire foot. Neurologic: Normal speech and language. No gross focal neurologic deficits are appreciated. No gait instability. Cranial nerves: II-X intact. No sensory loss or abnormal reflexes.  Skin:  Skin is warm, dry and intact. No rash noted. Psychiatric: Mood and affect are normal. Speech and behavior are normal. Patient exhibits appropriate insight and judgement.  ____________________________________________   LABS (all labs ordered are listed, but only abnormal results are displayed)  Labs Reviewed - No data to display ____________________________________________  EKG none ____________________________________________  RADIOLOGY DG foot complete right IMPRESSION: No fracture or dislocation. No evident arthropathy. ____________________________________________   PROCEDURES  Procedure(s) performed: no    Critical Care performed: no ____________________________________________   INITIAL IMPRESSION / ASSESSMENT AND PLAN / ED COURSE  Pertinent labs & imaging results that were available during my care of the patient were reviewed by me and considered in my medical decision making (see chart for details).  Patient presented with right foot pain after a mechanical patient left fell on her right foot. Patient physical exam findings and imaging are reassuring of no acute fracture or neurovascular injury. Patient advised to modify activity until symptoms improve as well as apply cold pack, elevate the extremity and apply an Ace wrap for compression to improve symptoms. Patient is currently [redacted] weeks pregnant and offered no complaints and demonstrated no symptoms in association with her pregnancy. Recommended she take Tylenol as needed for symptom relief. Reassessment of the patient  prior to discharge was reassuring. Patient advised to follow up with PCP as needed and was also advised to return to the emergency department for symptoms that change or worsen. Patient informed of clinical course, understand medical decision-making process, and agree with plan.  ____________________________________________   FINAL CLINICAL IMPRESSION(S) / ED DIAGNOSES  Final diagnoses:  Acute foot pain, right  Contusion of right foot, initial encounter       NEW MEDICATIONS STARTED DURING THIS VISIT:  New Prescriptions   No medications on file     Note:  This document was prepared using Dragon voice recognition software and may include unintentional dictation errors.    Clois Comber, PA-C 02/02/17 Garnette Gunner, MD 02/03/17 [phone]

## 2017-02-02 NOTE — ED Notes (Signed)
Patients UDS and breathe analysis completed. Paperwork placed on chart.

## 2017-02-02 NOTE — ED Notes (Signed)

## 2017-02-03 ENCOUNTER — Encounter: Payer: Self-pay | Admitting: Primary Care

## 2017-04-19 ENCOUNTER — Observation Stay
Admission: EM | Admit: 2017-04-19 | Discharge: 2017-04-19 | Disposition: A | Discharge status: Home / Self Care | Payer: Managed Care, Other (non HMO) | Attending: Obstetrics & Gynecology | Admitting: Obstetrics & Gynecology

## 2017-04-19 ENCOUNTER — Other Ambulatory Visit: Payer: Self-pay

## 2017-04-19 DIAGNOSIS — O99211 Obesity complicating pregnancy, first trimester: Secondary | ICD-10-CM

## 2017-04-19 DIAGNOSIS — O4402 Placenta previa specified as without hemorrhage, second trimester: Secondary | ICD-10-CM | POA: Diagnosis not present

## 2017-04-19 DIAGNOSIS — F329 Major depressive disorder, single episode, unspecified: Secondary | ICD-10-CM

## 2017-04-19 DIAGNOSIS — N390 Urinary tract infection, site not specified: Secondary | ICD-10-CM

## 2017-04-19 DIAGNOSIS — O479 False labor, unspecified: Secondary | ICD-10-CM

## 2017-04-19 DIAGNOSIS — B952 Enterococcus as the cause of diseases classified elsewhere: Secondary | ICD-10-CM

## 2017-04-19 DIAGNOSIS — Z3A22 22 weeks gestation of pregnancy: Secondary | ICD-10-CM | POA: Insufficient documentation

## 2017-04-19 DIAGNOSIS — O0991 Supervision of high risk pregnancy, unspecified, first trimester: Secondary | ICD-10-CM

## 2017-04-19 DIAGNOSIS — O99340 Other mental disorders complicating pregnancy, unspecified trimester: Secondary | ICD-10-CM

## 2017-04-19 DIAGNOSIS — F32A Depression, unspecified: Secondary | ICD-10-CM

## 2017-04-19 DIAGNOSIS — Z6837 Body mass index (BMI) 37.0-37.9, adult: Secondary | ICD-10-CM

## 2017-04-19 NOTE — OB Triage Note (Signed)
Pt arrived to triage with c/o abdominal cramping throughout the day and scant spotting after using the bathroom.  Denies leaking of fluid and is feeling baby move normally.  EFM and toco applied and tracing.

## 2017-04-19 NOTE — Progress Notes (Signed)
Patient ID: Loretta Burton, female   DOB: 12/21/1985, 31 y.o.   MRN: [id number]  S: Had a little splot of blood today and starting having cramping at work. Called and came in. No ROM, no decreased FM or any documented UC's.  O: Gen:31 yo G1P0 at 22 weeks with hx of placental previa on pelvic rest, A,A&O x 3 HEENT: Normocephalic, Eyes non-icteric. CV:S1S2, RRR, NO M/R/G Lungs: CTA bilat, no W/R./R NFA:OZHYQMAbd:Gravid Ext Gen: no blood or fluid at the perineum Speculum inserted and white dc present.  Cx is closed with white dc at the os. UC;s not showing on monitor FHR:140 No S/S of vag bleeding, ROM or UC's noted A: 1. IUP at 22 weeks 2. Hx of Placenta Previa 3. B-H's P: DC home 2. FU here or ER with any vag bleeding or S/S of pre-term labor. 3. Out of work till 04/21/17. 4. FU at [facility] Thurs as scheduled. Sharee Pimplearon W. Jones, RN, MSN, CNM, FNP

## 2017-04-19 NOTE — OB Triage Note (Signed)
Discharge instructions provided and reviewed.  Pt verbalized understanding and all concerns addressed.

## 2017-04-20 NOTE — L&D Delivery Note (Signed)
Delivery Note  First Stage: Labor onset: 0230 Augmentation : cytotec x 1, AROM and Pitocin Analgesia /Anesthesia intrapartum: Epidural AROM at 0036  Second Stage: Complete dilation at 0600 Onset of pushing at 0600 FHR second stage Cat I TRACING  Delivery of a viable FEMALE 08/13/2017  at 0614 by Heloise Ochoaebecca Ramirez Fullbright CNM delivery of fetal head in ROA position with restitution to ROT, no nuchal cord. Left posterior hand at neck, right anterior shoulder delivered easily with gentle downward traction followed by left posterior arm. Baby placed on mom's chest, and attended to by peds. Cord double clamped after cessation of pulsation, cut by CNM  Third Stage: Placenta delivered Spontaneously intact with 3VC @ [phone]; Placenta noted to have separate lobe approximately with several large vessels through membranes to the accessory lobe. Placenta disposition: to pathology Uterine tone firm / bleeding small  1st deg laceration identified  Anesthesia for repair: epidural Repair: 2-0 Vicryl CT-1 x 1 Est. Blood Loss (mL): 200  Complications: none  Mom to postpartum.  Baby to Couplet care / Skin to Skin.  Newborn: female: "[name]" Birth Weight: pending Apgar Scores: 8/9 Feeding planned: breast

## 2017-07-16 LAB — OB RESULTS CONSOLE GC/CHLAMYDIA
Chlamydia: NEGATIVE
Gonorrhea: NEGATIVE

## 2017-07-16 LAB — OB RESULTS CONSOLE RPR: RPR: NONREACTIVE

## 2017-07-16 LAB — OB RESULTS CONSOLE GBS: GBS: POSITIVE

## 2017-08-12 ENCOUNTER — Inpatient Hospital Stay
Admission: EM | Admit: 2017-08-12 | Discharge: 2017-08-14 | DRG: 807 | Disposition: A | Discharge status: Home / Self Care | Payer: Managed Care, Other (non HMO) | Attending: Obstetrics and Gynecology | Admitting: Obstetrics and Gynecology

## 2017-08-12 ENCOUNTER — Other Ambulatory Visit: Payer: Self-pay

## 2017-08-12 DIAGNOSIS — O24420 Gestational diabetes mellitus in childbirth, diet controlled: Secondary | ICD-10-CM | POA: Diagnosis present

## 2017-08-12 DIAGNOSIS — Z3A39 39 weeks gestation of pregnancy: Secondary | ICD-10-CM | POA: Diagnosis not present

## 2017-08-12 DIAGNOSIS — O368130 Decreased fetal movements, third trimester, not applicable or unspecified: Secondary | ICD-10-CM | POA: Diagnosis present

## 2017-08-12 DIAGNOSIS — D649 Anemia, unspecified: Secondary | ICD-10-CM | POA: Diagnosis present

## 2017-08-12 DIAGNOSIS — O99824 Streptococcus B carrier state complicating childbirth: Secondary | ICD-10-CM | POA: Diagnosis present

## 2017-08-12 DIAGNOSIS — O9902 Anemia complicating childbirth: Secondary | ICD-10-CM | POA: Diagnosis present

## 2017-08-12 HISTORY — DX: Decreased fetal movements, third trimester, not applicable or unspecified: O36.8130

## 2017-08-12 LAB — COMPREHENSIVE METABOLIC PANEL
ALBUMIN: 3.2 g/dL — AB (ref 3.5–5.0)
ALT: 13 U/L — ABNORMAL LOW (ref 14–54)
AST: 21 U/L (ref 15–41)
Alkaline Phosphatase: 157 U/L — ABNORMAL HIGH (ref 38–126)
Anion gap: 9 (ref 5–15)
BUN: 8 mg/dL (ref 6–20)
CHLORIDE: 108 mmol/L (ref 101–111)
CO2: 24 mmol/L (ref 22–32)
Calcium: 9 mg/dL (ref 8.9–10.3)
Creatinine, Ser: 0.36 mg/dL — ABNORMAL LOW (ref 0.44–1.00)
GFR calc Af Amer: >60 mL/min (ref >60)
GFR calc non Af Amer: >60 mL/min (ref >60)
Glucose, Bld: 87 mg/dL (ref 65–99)
POTASSIUM: 3.7 mmol/L (ref 3.5–5.1)
Sodium: 141 mmol/L (ref 135–145)
Total Bilirubin: 0.3 mg/dL (ref 0.3–1.2)
Total Protein: 6.9 g/dL (ref 6.5–8.1)

## 2017-08-12 LAB — CBC
HCT: 33 % — ABNORMAL LOW (ref 35.0–47.0)
Hemoglobin: 11.2 g/dL — ABNORMAL LOW (ref 12.0–16.0)
MCH: 29 pg (ref 26.0–34.0)
MCHC: 33.9 g/dL (ref 32.0–36.0)
MCV: 85.5 fL (ref 80.0–100.0)
Platelets: 236 10*3/uL (ref 150–440)
RBC: 3.86 MIL/uL (ref 3.80–5.20)
RDW: 14.3 % (ref 11.5–14.5)
WBC: 9 10*3/uL (ref 3.6–11.0)

## 2017-08-12 LAB — GLUCOSE, CAPILLARY: Glucose-Capillary: 78 mg/dL (ref 65–99)

## 2017-08-12 MED ORDER — OXYTOCIN 40 UNITS IN LACTATED RINGERS INFUSION - SIMPLE MED
1.0000 m[IU]/min | INTRAVENOUS | Status: DC
  Administered 2017-08-13: 2 m[IU]/min via INTRAVENOUS
  Filled 2017-08-12: qty 1000

## 2017-08-12 MED ORDER — TERBUTALINE SULFATE 1 MG/ML IJ SOLN: 0.2500 mg | Freq: Once | INTRAMUSCULAR | Status: DC | PRN

## 2017-08-12 MED ORDER — LACTATED RINGERS IV SOLN: 500.0000 mL | INTRAVENOUS | Status: DC | PRN

## 2017-08-12 MED ORDER — LIDOCAINE HCL (PF) 1 % IJ SOLN: 30.0000 mL | INTRAMUSCULAR | Status: DC | PRN

## 2017-08-12 MED ORDER — ACETAMINOPHEN 325 MG PO TABS: 650.0000 mg | ORAL_TABLET | ORAL | Status: DC | PRN

## 2017-08-12 MED ORDER — SOD CITRATE-CITRIC ACID 500-334 MG/5ML PO SOLN
30.0000 mL | ORAL | Status: DC | PRN
  Administered 2017-08-12: 30 mL via ORAL
  Filled 2017-08-12: qty 15

## 2017-08-12 MED ORDER — ONDANSETRON HCL 4 MG/2ML IJ SOLN
4.0000 mg | Freq: Four times a day (QID) | INTRAMUSCULAR | Status: DC | PRN
  Administered 2017-08-13: 4 mg via INTRAVENOUS
  Filled 2017-08-12: qty 2

## 2017-08-12 MED ORDER — LACTATED RINGERS IV SOLN
INTRAVENOUS | Status: DC
  Administered 2017-08-12 – 2017-08-13 (×2): via INTRAVENOUS

## 2017-08-12 MED ORDER — PENICILLIN G POT IN DEXTROSE 60000 UNIT/ML IV SOLN
3.0000 10*6.[IU] | INTRAVENOUS | Status: DC
  Administered 2017-08-13: 3 10*6.[IU] via INTRAVENOUS
  Filled 2017-08-12 (×8): qty 50

## 2017-08-12 MED ORDER — OXYTOCIN 40 UNITS IN LACTATED RINGERS INFUSION - SIMPLE MED: 2.5000 [IU]/h | INTRAVENOUS | Status: DC

## 2017-08-12 MED ORDER — MISOPROSTOL 25 MCG QUARTER TABLET
25.0000 ug | ORAL_TABLET | ORAL | Status: DC | PRN
  Administered 2017-08-12: 25 ug via VAGINAL
  Filled 2017-08-12: qty 1

## 2017-08-12 MED ORDER — SODIUM CHLORIDE 0.9 % IV SOLN
5.0000 10*6.[IU] | Freq: Once | INTRAVENOUS | Status: AC
  Administered 2017-08-12: 5 10*6.[IU] via INTRAVENOUS
  Filled 2017-08-12: qty 5

## 2017-08-12 MED ORDER — FENTANYL CITRATE (PF) 100 MCG/2ML IJ SOLN: 50.0000 ug | INTRAMUSCULAR | Status: DC | PRN

## 2017-08-12 MED ORDER — OXYTOCIN BOLUS FROM INFUSION
500.0000 mL | Freq: Once | INTRAVENOUS | Status: AC
  Administered 2017-08-13: 500 mL via INTRAVENOUS

## 2017-08-12 NOTE — H&P (Signed)
OB History & Physical   History of Present Illness:  Chief Complaint: decreased fetal movement x 2d, and leaking fluid since around 6pm  HPI:  Loretta Burton is a 32 y.o. 142P1001 female at 901w1d dated by LMP.  She presents to L&D for leaking and decreased FM. Variable decel noted x 1 with spontaneous recovery.   Decreased FM for 2 days, able to get 10 movements with [facility]KC later today.  onset of ctx : Irreg UCs overnight but occasional now.  LOF  / SROM @ 1800, increased cervical mucus and trickling.  bloody show: none    Pregnancy Issues: 1. GDMA1 2. Mild anemia, hgb 10.3 at 36wks 3. Placenta low-lying- resolved Anterior/left lateral but appears to have accessory lobe- send to Pathology 4. GBS Pos   Maternal Medical History:   Past Medical History:  Diagnosis Date  . Allergy    seasonal  . Anemia   . Cholelithiasis   . Depression   . Insomnia   . Panic attack     Past Surgical History:  Procedure Laterality Date  . CHOLECYSTECTOMY N/A 11/28/2014   Procedure: LAPAROSCOPIC CHOLECYSTECTOMY WITH INTRAOPERATIVE CHOLANGIOGRAM;  Surgeon: Lattie Hawichard E Cooper, MD;  Location: [facility] ORS;  Service: General;  Laterality: N/A;  . WISDOM TOOTH EXTRACTION     x4    Allergies  Allergen Reactions  . Adhesive [Tape] Itching  . Ciprofloxacin Hives and Nausea And Vomiting    Prior to Admission medications   Medication Sig Start Date End Date Taking? Authorizing Provider  loratadine (CLARITIN) 10 MG tablet Take 10 mg by mouth daily.    [provider]  Prenatal Vit-Fe Fumarate-FA (PRENATAL MULTIVITAMIN) TABS tablet Take 1 tablet by mouth daily at 12 noon.    [provider]  promethazine (PHENERGAN) 12.5 MG tablet Take 1 tablet (12.5 mg total) by mouth every 6 (six) hours as needed for nausea or vomiting. 01/11/17   Vena AustriaStaebler, Andreas, MD     [facility]  Social History: She  reports that she has never smoked. She has never used smokeless  tobacco. She reports that she does not drink alcohol or use drugs.  Family History: family history includes Cancer in her maternal grandmother; Depression in her mother; Diabetes in her maternal grandmother, mother, paternal grandfather, and paternal grandmother; Heart disease in her maternal grandmother, paternal grandfather, and paternal grandmother; Hypertension in her maternal grandmother, mother, paternal grandfather, and paternal grandmother.   Review of Systems: A full review of systems was performed and negative except as noted in the HPI.     Physical Exam:  Vital Signs: BP (!) 115/59 (BP Location: Left Arm)   Pulse (!) 105   Temp 98.3 F (36.8 C) (Oral)   Resp 18   Ht 5\' 4"  (1.626 m)   Wt 216 lb (98 kg)   LMP 10/19/2016   BMI 37.08 kg/m  General: no acute distress.  HEENT: normocephalic, atraumatic Heart: regular rate & rhythm.  No murmurs/rubs/gallops Lungs: clear to auscultation bilaterally, normal respiratory effort Abdomen: soft, gravid, non-tender;  EFW: 8lbs Pelvic:   External: Normal external female genitalia  Cervix: 3/75/-2; soft/posterior   Extremities: non-tender, symmetric, trace edema bilaterally.  DTRs: 2+ Neurologic: Alert & oriented x 3.    No results found for this or any previous visit (from the past 24 hour(s)).  Pertinent Results:  Prenatal Labs: Blood type/Rh  A Pos  Antibody screen neg  Rubella Immune  Varicella Immune  RPR NR  HBsAg  Neg  HIV NR  GC neg  Chlamydia neg  Genetic screening negative  1 hour GTT  185  3 hour GTT  [phone]  GBS  Pos   FHT: 150bpm, mod variability, + accels. Noted variable decel x 50sec to 125bpm at 1947 TOCO: irregular occasional UC   Cephalic by leopolds and exam  No results found.  Assessment:  Loretta Burton is a 32 y.o. G15P1001 female at [redacted]w[redacted]d with decreased fetal movement and favorable cervix at term. .   Plan:  1. Admit to Labor & Delivery; consents reviewed and obtained  2. Fetal  Well being  - Fetal Tracing: now Cat I - Group B Streptococcus ppx indicated: Pos - Presentation: vertex confirmed by exam and leopolds   3. Routine OB: - Prenatal labs reviewed, as above - Rh: A pos - CBC, T&S, RPR on admit - Clear fluids, IVF  4. Induction of Labor d/t decreased FM x 2days.  -  Contractions: external toco in place -  Pelvis proven to 7#14 -  Plan for induction with vaginal cytotec -  Plan for continuous fetal monitoring  -  Maternal pain control as desired; requesting regional anesthesia when needed.  - Anticipate vaginal delivery  5. Post Partum Planning: - Infant feeding: Breast - Contraception: nexplanon - Imms up to date, Tdap and Flu given.   McVey, REBECCA A, CNM 08/12/17 8:30 PM

## 2017-08-13 ENCOUNTER — Encounter: Payer: Self-pay | Admitting: Anesthesiology

## 2017-08-13 ENCOUNTER — Inpatient Hospital Stay: Payer: Managed Care, Other (non HMO) | Admitting: Anesthesiology

## 2017-08-13 LAB — TYPE AND SCREEN
ABO/RH(D): A POS
Antibody Screen: NEGATIVE

## 2017-08-13 LAB — GLUCOSE, CAPILLARY
GLUCOSE-CAPILLARY: 78 mg/dL (ref 65–99)
GLUCOSE-CAPILLARY: 88 mg/dL (ref 65–99)

## 2017-08-13 MED ORDER — ONDANSETRON HCL 4 MG/2ML IJ SOLN: 4.0000 mg | INTRAMUSCULAR | Status: DC | PRN

## 2017-08-13 MED ORDER — IBUPROFEN 600 MG PO TABS
600.0000 mg | ORAL_TABLET | Freq: Four times a day (QID) | ORAL | Status: DC
  Administered 2017-08-13 – 2017-08-14 (×5): 600 mg via ORAL
  Filled 2017-08-13 (×5): qty 1

## 2017-08-13 MED ORDER — COCONUT OIL OIL
1.0000 | TOPICAL_OIL | Status: DC | PRN
Start: 2017-08-13 — End: 2017-08-14
  Administered 2017-08-13: 1 via TOPICAL
  Filled 2017-08-13: qty 120

## 2017-08-13 MED ORDER — DIBUCAINE 1 % RE OINT: 1.0000 "application " | TOPICAL_OINTMENT | RECTAL | Status: DC | PRN

## 2017-08-13 MED ORDER — SIMETHICONE 80 MG PO CHEW: 80.0000 mg | CHEWABLE_TABLET | ORAL | Status: DC | PRN

## 2017-08-13 MED ORDER — DIPHENHYDRAMINE HCL 50 MG/ML IJ SOLN: 12.5000 mg | INTRAMUSCULAR | Status: DC | PRN

## 2017-08-13 MED ORDER — WITCH HAZEL-GLYCERIN EX PADS: 1.0000 "application " | MEDICATED_PAD | CUTANEOUS | Status: DC | PRN

## 2017-08-13 MED ORDER — PHENYLEPHRINE 40 MCG/ML (10ML) SYRINGE FOR IV PUSH (FOR BLOOD PRESSURE SUPPORT): 80.0000 ug | PREFILLED_SYRINGE | INTRAVENOUS | Status: DC | PRN

## 2017-08-13 MED ORDER — ONDANSETRON HCL 4 MG PO TABS: 4.0000 mg | ORAL_TABLET | ORAL | Status: DC | PRN

## 2017-08-13 MED ORDER — SENNOSIDES-DOCUSATE SODIUM 8.6-50 MG PO TABS
2.0000 | ORAL_TABLET | ORAL | Status: DC
  Filled 2017-08-13: qty 2

## 2017-08-13 MED ORDER — FENTANYL 2.5 MCG/ML W/ROPIVACAINE 0.15% IN NS 100 ML EPIDURAL (ARMC)
12.0000 mL/h | EPIDURAL | Status: DC
  Administered 2017-08-13: 12 mL/h via EPIDURAL

## 2017-08-13 MED ORDER — FENTANYL 2.5 MCG/ML W/ROPIVACAINE 0.15% IN NS 100 ML EPIDURAL (ARMC)
EPIDURAL | Status: AC
  Filled 2017-08-13: qty 100

## 2017-08-13 MED ORDER — EPHEDRINE 5 MG/ML INJ: 10.0000 mg | INTRAVENOUS | Status: DC | PRN

## 2017-08-13 MED ORDER — LACTATED RINGERS IV SOLN: 500.0000 mL | Freq: Once | INTRAVENOUS | Status: DC

## 2017-08-13 MED ORDER — ZOLPIDEM TARTRATE 5 MG PO TABS: 5.0000 mg | ORAL_TABLET | Freq: Every evening | ORAL | Status: DC | PRN

## 2017-08-13 MED ORDER — BUPIVACAINE HCL (PF) 0.25 % IJ SOLN
INTRAMUSCULAR | Status: DC | PRN
  Administered 2017-08-13: 5 mL via EPIDURAL

## 2017-08-13 MED ORDER — BENZOCAINE-MENTHOL 20-0.5 % EX AERO
1.0000 "application " | INHALATION_SPRAY | CUTANEOUS | Status: DC | PRN
  Administered 2017-08-13: 1 via TOPICAL
  Filled 2017-08-13: qty 56

## 2017-08-13 MED ORDER — DIPHENHYDRAMINE HCL 25 MG PO CAPS: 25.0000 mg | ORAL_CAPSULE | Freq: Four times a day (QID) | ORAL | Status: DC | PRN

## 2017-08-13 MED ORDER — PRENATAL MULTIVITAMIN CH
1.0000 | ORAL_TABLET | Freq: Every day | ORAL | Status: DC
  Administered 2017-08-13: 1 via ORAL
  Filled 2017-08-13: qty 1

## 2017-08-13 MED ORDER — ACETAMINOPHEN 325 MG PO TABS
650.0000 mg | ORAL_TABLET | ORAL | Status: DC | PRN
  Administered 2017-08-13 – 2017-08-14 (×3): 650 mg via ORAL
  Filled 2017-08-13 (×3): qty 2

## 2017-08-13 NOTE — Lactation Note (Signed)
This note was copied from a baby's chart. Lactation Consultation Note  Patient Name: Loretta Burton [id number]'s Date: 08/13/2017 Reason for consult: Initial assessment Offered assistance as needed  Maternal Data Formula Feeding for Exclusion: No Does the patient have breastfeeding experience prior to this delivery?: Yes  Feeding Feeding Type: Breast Fed Length of feed: 15 min  LATCH Score Latch: (did not observe a feeding )                 Interventions    Lactation Tools Discussed/Used WIC Program: No   Consult Status Consult Status: PRN    [name] 08/13/2017, 3:38 PM

## 2017-08-13 NOTE — Discharge Summary (Signed)
Obstetrical Discharge Summary  Patient Name: Loretta Burton DOB: 03-19-1986 MRN: [id number]  Date of Admission: 08/12/2017 Date of Delivery: 08/13/17 Delivered by: Heloise Ochoa CNM Date of Discharge: 08/14/2017  Primary OB: [facility]  Loretta Burton last menstrual period was 10/19/2016. EDC Estimated Date of Delivery: 08/18/17 Gestational Age at Delivery: [redacted]w[redacted]d   Antepartum complications:  1. GDMA1 2. Mild anemia, hgb 10.3 at 36wks 3. Placenta low-lying- resolved;  Anterior/left lateral but appears to have accessory lobe- sent to Pathology 4. GBS Pos   Admitting Diagnosis:  Secondary Diagnosis: Patient Active Problem List   Diagnosis Date Noted  . Decreased fetus movements affecting management of mother in third trimester 08/12/2017  . Nausea & vomiting 01/05/2017  . UTI (urinary tract infection) 01/05/2017  . UTI (urinary tract infection) due to Enterococcus 01/04/2017  . Supervision of high-risk pregnancy 12/24/2016  . Obesity affecting pregnancy 12/24/2016  . BMI 37.0-37.9, adult 12/24/2016  . Depression affecting pregnancy 12/24/2016  . Recurrent sinusitis 11/21/2015  . Insomnia 11/21/2015  . Anxiety and depression 01/17/2014    Augmentation: AROM, Pitocin and Cytotec Complications: None Intrapartum complications/course: admitted with DFM x 2d and isolated variable decel, cytotec given x 1 dose, then AROM and Pitocin, epidural given on request. Pt progressed to C/C/+1; pushed effectively for SVD of infant female.  Date of Delivery: 08/13/17 Delivered By: Heloise Ochoa CNM Delivery Type: spontaneous vaginal delivery Anesthesia: epidural Placenta: spontaneous Laceration: 1st deg perineal with repair Episiotomy: none Newborn Data: Live born female "Loretta Burton" Birth Weight:  8lb5.7oz APGAR: 8, 9  Newborn Delivery   Birth date/time:  08/13/2017 06:14:00 Delivery type:  Vaginal, Spontaneous     Postpartum Procedures: None  Post partum course:  Patient had  an uncomplicated postpartum course.  By time of discharge on PPD#1, her pain was controlled on oral pain medications; she had appropriate lochia and was ambulating, voiding without difficulty and tolerating regular diet.  She was deemed stable for discharge to home.     Discharge Physical Exam:  BP 118/70 (BP Location: Right Arm)   Pulse 72   Temp 97.7 F (36.5 C) (Oral)   Resp 18   Ht 5\' 4"  (1.626 m)   Wt 98 kg (216 lb)   LMP 10/19/2016   SpO2 100%   Breastfeeding? Unknown   BMI 37.08 kg/m   General: NAD CV: RRR Pulm: CTABL, nl effort ABD: s/nd/nt, fundus firm and below the umbilicus Lochia: moderate Perineum: well approximated DVT Evaluation: LE non-ttp, no evidence of DVT on exam.  Hemoglobin  Date Value Ref Range Status  08/14/2017 10.4 (L) 12.0 - 16.0 g/dL Final   HCT  Date Value Ref Range Status  08/14/2017 31.1 (L) 35.0 - 47.0 % Final     Disposition: stable, discharge to home. Baby Feeding: breastmilk Baby Disposition: home with mom  Rh Immune globulin given: n/a Rubella vaccine given: n/a Varicella vaccine given: n/a Tdap vaccine given in AP or PP setting: AP 06/08/17 Flu vaccine given in AP or PP setting: AP 01/15/17  Contraception: Nexplanon  Prenatal Labs:  Blood type/Rh  A Pos  Antibody screen neg  Rubella Immune  Varicella Immune  RPR NR  HBsAg Neg  HIV NR  GC neg  Chlamydia neg  Genetic screening negative  1 hour GTT  185  3 hour GTT  [phone]  GBS  Pos   Plan:  Loretta Burton was discharged to home in good condition. Follow-up appointment with delivering provider in 6 weeks.  Discharge Medications: Allergies  as of 08/14/2017      Reactions   Adhesive [tape] Itching   Ciprofloxacin Hives, Nausea And Vomiting      Medication List    STOP taking these medications   promethazine 12.5 MG tablet Commonly known as:  PHENERGAN     TAKE these medications   acetaminophen 325 MG tablet Commonly known as:  TYLENOL Take 2  tablets (650 mg total) by mouth every 4 (four) hours as needed for mild pain or moderate pain.   ferrous sulfate 325 (65 FE) MG tablet Take 1 tablet (325 mg total) by mouth daily with breakfast.   ibuprofen 600 MG tablet Commonly known as:  ADVIL,MOTRIN Take 1 tablet (600 mg total) by mouth every 6 (six) hours as needed for mild pain, moderate pain or cramping.   loratadine 10 MG tablet Commonly known as:  CLARITIN Take 10 mg by mouth daily.   prenatal multivitamin Tabs tablet Take 1 tablet by mouth daily at 12 noon.       Follow-up Information    McVey, Prudencio PairRebecca A, CNM. Schedule an appointment as soon as possible for a visit in 6 week(s).   Specialty:  Obstetrics and Gynecology Contact information: [address] [place] [id number] [phone]           Signed:  Genia DelMargaret Consepcion Burton, CNM 08/14/2017 9:06 AM

## 2017-08-13 NOTE — Progress Notes (Signed)
Labor Progress Note  Jordan LikesSamantha R Burton is a 32 y.o. G2P1001 at 5768w2d by LMP admitted for induction of labor due to decreased FM and isolated variable decel, preg c/b GDMA1, anemia and GBS Pos.   Subjective: feelingmild cramping, worse when she lays on her back. Denies HA, VD or RUQ pain. No bloody show.   Objective: BP (!) 106/54 (BP Location: Left Arm)   Pulse (!) 103   Temp 97.6 F (36.4 C) (Oral)   Resp 18   Ht 5\' 4"  (1.626 m)   Wt 216 lb (98 kg)   LMP 10/19/2016   BMI 37.08 kg/m  Notable VS details: reviewed  Fetal Assessment: FHT:  FHR: 155 bpm, variability: moderate,  accelerations:  Present,  decelerations:  Absent Category/reactivity:  Category I UC:   irregular, every 2-4 minutes, palpate mild.  SVE:   3-4/75/-2; fetal head well applied, cx remains posterior.  Membrane status: AROM performed, small amt clear fluid   Labs: Lab Results  Component Value Date   WBC 9.0 08/12/2017   HGB 11.2 (L) 08/12/2017   HCT 33.0 (L) 08/12/2017   MCV 85.5 08/12/2017   PLT 236 08/12/2017    Assessment / Plan: G2P1001 at 3868w2d with IOL for decreased FM x 2 days  GDMA1- blood sugars q4h  Labor: slight cervical change since 1 dose cytotec, AROM now, will start Pitocin in 30min to 1h if UCs not increasing frequency and intensity.  Fetal Wellbeing:  Category I Pain Control:  Labor support without medications , plans epidural when increased pain.  I/D:  GBS Pos, PCN q4h until delivery Anticipated MOD:  NSVD  Micaiah Litle A, CNM 08/13/2017, 12:44 AM

## 2017-08-13 NOTE — Anesthesia Procedure Notes (Signed)
Epidural Patient location during procedure: OB  Staffing Anesthesiologist: Loraine Bhullar, MD Performed: anesthesiologist   Preanesthetic Checklist Completed: patient identified, site marked, surgical consent, pre-op evaluation, timeout performed, IV checked, risks and benefits discussed and monitors and equipment checked  Epidural Patient position: sitting Prep: ChloraPrep Patient monitoring: heart rate, continuous pulse ox and blood pressure Approach: midline Location: L4-L5 Injection technique: LOR saline  Needle:  Needle type: Tuohy  Needle gauge: 18 G Needle length: 9 cm and 9 Catheter type: closed end flexible Catheter size: 20 Guage Test dose: negative and 1.5% lidocaine with Epi 1:200 K  Assessment Sensory level: T10 Events: blood not aspirated, injection not painful, no injection resistance, negative IV test and no paresthesia  Additional Notes   Patient tolerated the insertion well without complications.Reason for block:procedure for pain     

## 2017-08-13 NOTE — Anesthesia Preprocedure Evaluation (Signed)
Anesthesia Evaluation  Patient identified by MRN, date of birth, ID band Patient awake    Reviewed: Allergy & Precautions, NPO status , Patient's Chart, lab work & pertinent test results, reviewed documented beta blocker date and time   Airway Mallampati: III  TM Distance: >3 FB     Dental  (+) Chipped   Pulmonary           Cardiovascular      Neuro/Psych PSYCHIATRIC DISORDERS Anxiety Depression    GI/Hepatic   Endo/Other    Renal/GU      Musculoskeletal   Abdominal   Peds  Hematology  (+) anemia ,   Anesthesia Other Findings Obese.  Reproductive/Obstetrics                             Anesthesia Physical Anesthesia Plan  ASA: III  Anesthesia Plan: Epidural   Post-op Pain Management:    Induction:   PONV Risk Score and Plan:   Airway Management Planned:   Additional Equipment:   Intra-op Plan:   Post-operative Plan:   Informed Consent: I have reviewed the patients History and Physical, chart, labs and discussed the procedure including the risks, benefits and alternatives for the proposed anesthesia with the patient or authorized representative who has indicated his/her understanding and acceptance.     Plan Discussed with: CRNA  Anesthesia Plan Comments:         Anesthesia Quick Evaluation

## 2017-08-14 LAB — CBC
HCT: 31.1 % — ABNORMAL LOW (ref 35.0–47.0)
HEMOGLOBIN: 10.4 g/dL — AB (ref 12.0–16.0)
MCH: 29.2 pg (ref 26.0–34.0)
MCHC: 33.6 g/dL (ref 32.0–36.0)
MCV: 87 fL (ref 80.0–100.0)
Platelets: 209 10*3/uL (ref 150–440)
RBC: 3.57 MIL/uL — ABNORMAL LOW (ref 3.80–5.20)
RDW: 14.3 % (ref 11.5–14.5)
WBC: 7.7 10*3/uL (ref 3.6–11.0)

## 2017-08-14 LAB — RPR: RPR Ser Ql: NONREACTIVE

## 2017-08-14 MED ORDER — FERROUS SULFATE 325 (65 FE) MG PO TABS: 325.0000 mg | ORAL_TABLET | Freq: Every day | ORAL | Status: DC

## 2017-08-14 MED ORDER — ACETAMINOPHEN 325 MG PO TABS: 650.0000 mg | ORAL_TABLET | ORAL | Status: DC | PRN

## 2017-08-14 MED ORDER — IBUPROFEN 600 MG PO TABS: 600.0000 mg | ORAL_TABLET | Freq: Four times a day (QID) | ORAL | Status: DC | PRN

## 2017-08-14 NOTE — Progress Notes (Signed)
Pt discharged home per order by CNM, Genia Del. Discharge instructions reviewed. Additional questions. Fundus firm, midline, one below umbilicus and bleeding scant. Pt reported adequate pain relief rating lower abdominal cramping 1/10. Transported with infant via wheelchair accompanied by husband by [place].

## 2017-08-14 NOTE — Discharge Instructions (Signed)
After Your Delivery Discharge Instructions °  °Postpartum: Care Instructions ° °After childbirth (postpartum period), your body goes through many changes. Some of these changes happen over several weeks. In the hours after delivery, your body will begin to recover from childbirth while it prepares to breastfeed your newborn. You may feel emotional during this time. Your hormones can shift your mood without warning for no clear reason. ° °In the first couple of weeks after childbirth, many women have emotions that change from happy to sad. You may find it hard to sleep. You may cry a lot. This is called the "baby blues." These overwhelming emotions often go away within a couple of days or weeks. But it's important to discuss your feelings with your doctor. ° °You should call your care provider if you have unrelieved feelings of: °· Inability to cope °· Sadness °· Anxiety °· Lack of interest in baby °· Insomnia °· Crying ° °It is easy to get too tired and overwhelmed during the first weeks after childbirth. Don't try to do too much. Get rest whenever you can, accept help from others, and eat well and drink plenty of fluids. ° °About 4 to 6 weeks after your baby's birth, you will have a follow-up visit with your care provider. This visit is your time to talk to your provider about anything you are concerned or curious about. ° °Follow-up care is a key part of your treatment and safety. Be sure to make and go to all appointments, and call your doctor if you are having problems. It's also a good idea to know your test results and keep a list of the medicines you take. ° °How can you care for yourself at home? °· Sleep or rest when your baby sleeps. °· Get help with household chores from family or friends, if you can. Do not try to do it all yourself. °· If you have hemorrhoids or swelling or pain around the opening of your vagina, try using cold and heat. You can put ice or a cold pack on the area for 10 to 20 minutes at  a time. Put a thin cloth between the ice and your skin. Also try sitting in a few inches of warm water (sitz bath) 3 times a day and after bowel movements. °· Take pain medicines exactly as directed. °· If the provider gave you a prescription medicine for pain, take it as prescribed. °· If you do not have a prescription and need something over the counter, you can take: °· Ibuprofen (Motrin, Advil) up to 600mg every 6 hours as needed for pain °· Acetaminophen (Tylenol) up to 650mg every 4 hours as needed for pain °· Some people find it helpful to alternate between these two medications.  °· No driving for 1-2 weeks or while taking pain medications.  °· Eat more fiber to avoid constipation. Include foods such as whole-grain breads and cereals, raw vegetables, raw and dried fruits, and beans. °· Drink plenty of fluids, enough so that your urine is light yellow or clear like water. If you have kidney, heart, or liver disease and have to limit fluids, talk with your doctor before you increase the amount of fluids you drink. °· Do not put anything in the vagina for 6 weeks. This means no sex, no tampons, no douching, and no enemas. °· If you have stitches, keep the area clean by pouring or spraying warm water over the area outside your vagina and anus after you use the toilet. °·   No strenuous activity or heavy lifting for 6 weeks  °· No tub baths; showers only °· Continue prenatal vitamin and iron. °· If breastfeeding: °· Increase calories and fluids while breastfeeding. °· You may have a slight fever when your milk comes in, but it should go away on its own. If it does not, and rises above 101.0 please call the doctor. °· For breastfeeding concerns, the lactation consultant can be reached at [phone]. °· For concerns about your baby, please call your pediatrician. ° °· Keep a list of questions to bring to your postpartum visit. Your questions might be about: °· Changes in your breasts, such as lumps or  soreness. °· When to expect your menstrual period to start again. °· What form of birth control is best for you. °· Weight you have put on during the pregnancy. °· Exercise options. °· What foods and drinks are best for you, especially if you are breastfeeding. °· Problems you might be having with breastfeeding. °· When you can have sex. Some women may want to talk about lubricants for the vagina. °· Any feelings of sadness or restlessness that you are having. ° ° °When should you call for help? ° °Call 911 anytime you think you may need emergency care. For example, call if: °· You have thoughts of harming yourself, your baby, or another person. °· You passed out (lost consciousness). ° °Call the office at [phone] or seek immediate medical care if: °· If you have heavy bleeding such that you are soaking 1 pad in an hour for 2 hours °· You are dizzy or lightheaded, or you feel like you may faint. °· You have a fever; a temperature of 101.0 F or greater °· Chills °· Difficulty urinating °· Headache unrelieved by "pain meds"  °· Visual changes °· Pain in the right side of your belly near your ribs °· Breasts reddened, hard, hot to the touch or any other breast concerns °· Nipple discharge which is foul-smelling or contains pus  °· Increased pain at the site of the tear  °· New pain unrelieved with recommended over-the-counter dosages °· Difficulty breathing with or without chest pain  °· New leg pain, swelling, or redness, especially if it is only on one leg °· Any other concerns ° °Watch closely for changes in your health, and be sure to contact your provider if: °· You have new or worse vaginal discharge. °· You feel sad or depressed. °· You are having problems with your breasts or breastfeeding. ° ° ° °

## 2017-08-14 NOTE — Anesthesia Postprocedure Evaluation (Addendum)
Anesthesia Post Note  Patient: Loretta Burton  Procedure(s) Performed: AN AD HOC LABOR EPIDURAL  Patient location during evaluation: Mother Baby Anesthesia Type: Epidural Level of consciousness: awake and alert Pain management: pain level controlled Vital Signs Assessment: post-procedure vital signs reviewed and stable Respiratory status: spontaneous breathing, nonlabored ventilation and respiratory function stable Cardiovascular status: stable Postop Assessment: no headache and no backache Anesthetic complications: no Comments: Patient was ambulating well, V/S were stable, and her pain was well controlled on discharge per nursing report.     Last Vitals:  Vitals:   08/13/17 2342 08/14/17 0825  BP: (!) 115/59 118/70  Pulse: 70 72  Resp: 18 18  Temp: 36.6 C 36.5 C  SpO2: 97% 100%    Last Pain:  Vitals:   08/14/17 1012  TempSrc:   PainSc: 1                  Lenard Simmer

## 2017-08-14 NOTE — Progress Notes (Signed)
Discharge instructions reviewed with patient. Denies any needs at this time.

## 2017-08-14 NOTE — Plan of Care (Signed)
Patient's vital signs stable; fundus firm; small amount rubra lochia; independent peri care; patient demonstrates excellent infant care; breastfeeding with good technique observed; good appetite; good po fluids; pain controlled with po tylenol and po motrin; plans to watch purple cry dvd later; plans for discharge today.

## 2017-08-14 NOTE — Progress Notes (Signed)
Patient watched Purple Cry DVD.

## 2017-08-16 LAB — SURGICAL PATHOLOGY

## 2018-01-24 ENCOUNTER — Ambulatory Visit (INDEPENDENT_AMBULATORY_CARE_PROVIDER_SITE_OTHER): Payer: No Typology Code available for payment source | Admitting: Family Medicine

## 2018-01-24 ENCOUNTER — Encounter: Payer: Self-pay | Admitting: Family Medicine

## 2018-01-24 DIAGNOSIS — J22 Unspecified acute lower respiratory infection: Secondary | ICD-10-CM

## 2018-01-24 DIAGNOSIS — R05 Cough: Secondary | ICD-10-CM

## 2018-01-24 DIAGNOSIS — R059 Cough, unspecified: Secondary | ICD-10-CM

## 2018-01-24 MED ORDER — PREDNISONE 20 MG PO TABS
20.0000 mg | ORAL_TABLET | Freq: Every day | ORAL | 0 refills | Status: DC
Start: 2018-01-24 — End: 2019-01-05

## 2018-01-24 MED ORDER — IPRATROPIUM-ALBUTEROL 0.5-2.5 (3) MG/3ML IN SOLN
3.0000 mL | Freq: Once | RESPIRATORY_TRACT | Status: AC
  Administered 2018-01-24: 3 mL via RESPIRATORY_TRACT

## 2018-01-24 MED ORDER — ALBUTEROL SULFATE HFA 108 (90 BASE) MCG/ACT IN AERS: 2.0000 | INHALATION_SPRAY | RESPIRATORY_TRACT | 1 refills | Status: DC | PRN

## 2018-01-24 MED ORDER — AMOXICILLIN 875 MG PO TABS: 875.0000 mg | ORAL_TABLET | Freq: Two times a day (BID) | ORAL | 0 refills | Status: DC

## 2018-01-24 NOTE — Patient Instructions (Addendum)
Can take while breastfeeding-  For allergies/nasal drainage- loratadine (Claritin)  I have sent amoxicillin, prednisone and albuterol inhaler to your pharmacy  Please use the inhaler every 4-6 hours while awake for the next two days then every 4-6 hours as needed  Continue Mucinex DM, Sudafed, nasal spray as needed  If not better in 3-5 days or if worse, please let us know

## 2018-01-24 NOTE — Progress Notes (Signed)
Subjective:    Patient ID: Loretta Burton, female    DOB: March 04, 1986, 32 y.o.   MRN: [id number]  HPI This is a 32 yo female who presents today with cough x 1 week. Works at [employer], nurses listened to her and said one side is diminished. No fever. Cough productive with yellow to green sputum. Using Mucinex DM and sudafed. Little improvement. Not taking any antihistamine. Some tylenol as needed. Headache at top of head and above eyes. Some PND, large amount of thick-thin drainage. Using nasal saline. Some intermittent wheezing, no SOB.  Entire family sick with similar symptoms.  Breastfeeding her 54 month old.   Past Medical History:  Diagnosis Date  . Allergy    seasonal  . Anemia   . Cholelithiasis   . Depression   . Insomnia   . Panic attack    Past Surgical History:  Procedure Laterality Date  . CHOLECYSTECTOMY N/A 11/28/2014   Procedure: LAPAROSCOPIC CHOLECYSTECTOMY WITH INTRAOPERATIVE CHOLANGIOGRAM;  Surgeon: Lattie Haw, MD;  Location: [facility] ORS;  Service: General;  Laterality: N/A;  . WISDOM TOOTH EXTRACTION     x4   Family History  Problem Relation Age of Onset  . Diabetes Mother   . Hypertension Mother   . Depression Mother   . Diabetes Maternal Grandmother   . Hypertension Maternal Grandmother   . Heart disease Maternal Grandmother   . Cancer Maternal Grandmother        skin  . Diabetes Paternal Grandmother   . Hypertension Paternal Grandmother   . Heart disease Paternal Grandmother   . Diabetes Paternal Grandfather   . Hypertension Paternal Grandfather   . Heart disease Paternal Grandfather    Social History   Tobacco Use  . Smoking status: Never Smoker  . Smokeless tobacco: Never Used  Substance Use Topics  . Alcohol use: No  . Drug use: No      Review of Systems Per HPI    Objective:   Physical Exam  Constitutional: She is oriented to person, place, and time. She appears well-developed and well-nourished. She appears ill. No distress.    HENT:  Head: Normocephalic and atraumatic.  Right Ear: Tympanic membrane, external ear and ear canal normal.  Left Ear: Tympanic membrane, external ear and ear canal normal.  Nose: Mucosal edema and rhinorrhea present.  Mouth/Throat: Uvula is midline, oropharynx is clear and moist and mucous membranes are normal.  Eyes: Conjunctivae are normal.  Neck: Normal range of motion. Neck supple.  Cardiovascular: Normal rate, regular rhythm and normal heart sounds.  Pulmonary/Chest: Effort normal.  Near constant cough, somewhat improved with duo neb treatment. Mildly decreased lung sounds left UL, normal lung sounds following duo neb.   Lymphadenopathy:    She has no cervical adenopathy.  Neurological: She is alert and oriented to person, place, and time.  Skin: Skin is warm and dry. She is not diaphoretic.  Psychiatric: She has a normal mood and affect. Her behavior is normal. Judgment and thought content normal.  Vitals reviewed.     BP 108/60 (BP Location: Left Arm, Patient Position: Sitting, Cuff Size: Normal)   Pulse 71   Temp 98.3 F (36.8 C) (Oral)   Ht 5\' 4"  (1.626 m)   Wt 186 lb (84.4 kg)   LMP 01/10/2018   SpO2 97%   BMI 31.93 kg/m  Wt Readings from Last 3 Encounters:  01/24/18 186 lb (84.4 kg)  08/12/17 216 lb (98 kg)  04/19/17 208 lb (94.3 kg)  Assessment & Plan:  1. Lower respiratory infection - given duration and severity of symptoms, will treat with antibiotic, inhaler, prednisone - Provided written and verbal information regarding diagnosis and treatment. - RTC precautions reviewed - amoxicillin (AMOXIL) 875 MG tablet; Take 1 tablet (875 mg total) by mouth 2 (two) times daily.  Dispense: 14 tablet; Refill: 0 - predniSONE (DELTASONE) 20 MG tablet; Take 1 tablet (20 mg total) by mouth daily with breakfast.  Dispense: 5 tablet; Refill: 0 - albuterol (PROVENTIL HFA;VENTOLIN HFA) 108 (90 Base) MCG/ACT inhaler; Inhale 2 puffs into the lungs every 4 (four) hours as  needed for wheezing or shortness of breath (cough, shortness of breath or wheezing.).  Dispense: 1 Inhaler; Refill: 1  2. Cough - albuterol (PROVENTIL HFA;VENTOLIN HFA) 108 (90 Base) MCG/ACT inhaler; Inhale 2 puffs into the lungs every 4 (four) hours as needed for wheezing or shortness of breath (cough, shortness of breath or wheezing.).  Dispense: 1 Inhaler; Refill: 1 - ipratropium-albuterol (DUONEB) 0.5-2.5 (3) MG/3ML nebulizer solution 3 mL   Olean Ree, FNP-BC  [facility], [facility]  01/24/2018 1:19 PM

## 2018-10-20 ENCOUNTER — Telehealth: Payer: Self-pay

## 2018-10-20 NOTE — Telephone Encounter (Signed)
Pt works for [employer] and pts parents have been exposed to positive coworkers; pts parents are not sick just feeling tired but working long hours and cough. Pt notified [facility] and was advised to ck with PCP. Pt is feeling tired but working long hours and has a very occasional non prod and prod cough on and off for 1 month. Pt wants to know if needs covid testing. Dr Silvio Pate said to send note to him and he will do covid testing. Pt appreciative.

## 2018-10-20 NOTE — Telephone Encounter (Signed)
Please let her know that she should be contacted about going for testing

## 2018-10-20 NOTE — Telephone Encounter (Signed)
Pt notified as instructed and pt voiced understanding and appreciative. °

## 2018-10-21 ENCOUNTER — Telehealth: Payer: Self-pay | Admitting: *Deleted

## 2018-10-21 DIAGNOSIS — Z20822 Contact with and (suspected) exposure to covid-19: Secondary | ICD-10-CM

## 2018-10-21 NOTE — Telephone Encounter (Addendum)
Attempted to contact pt to schedule COVID Testing; left message on voicemail; orders placed per protocol.    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

## 2018-10-21 NOTE — Telephone Encounter (Signed)
Pt called and scheduled for testing on 10/25/18 at [facility] location. Pt advised to wear a mask and to remain in car for appt time. Pt verbalized understanding.

## 2018-10-21 NOTE — Telephone Encounter (Signed)
Patient is calling back to schedule COVID testing. Please advise CB- [phone]

## 2018-10-24 ENCOUNTER — Other Ambulatory Visit: Payer: Self-pay

## 2018-10-24 DIAGNOSIS — Z20822 Contact with and (suspected) exposure to covid-19: Secondary | ICD-10-CM

## 2018-10-25 ENCOUNTER — Other Ambulatory Visit: Payer: Self-pay

## 2018-10-28 ENCOUNTER — Telehealth: Payer: Self-pay | Admitting: Primary Care

## 2018-10-28 NOTE — Telephone Encounter (Addendum)
I have called patient. She did get her testing done on 10/25/2018. Patient was asking for the results. I have told patient that we received notice that are delays up to 7-10 days. I have apologize for the delay.

## 2018-10-28 NOTE — Telephone Encounter (Signed)
Pt left a voicemail on machine  Requesting Covid results - no current symptoms.  Pt needs to get back to work  There are still no results in [place].  Will send to Gentry Fitz, FNP

## 2018-10-28 NOTE — Telephone Encounter (Signed)
Noted  

## 2018-10-30 LAB — NOVEL CORONAVIRUS, NAA: SARS-CoV-2, NAA: NOT DETECTED

## 2019-01-05 ENCOUNTER — Other Ambulatory Visit: Payer: Self-pay

## 2019-01-05 ENCOUNTER — Ambulatory Visit: Payer: Managed Care, Other (non HMO) | Admitting: Primary Care

## 2019-01-05 ENCOUNTER — Encounter: Payer: Self-pay | Admitting: Primary Care

## 2019-01-05 DIAGNOSIS — F329 Major depressive disorder, single episode, unspecified: Secondary | ICD-10-CM

## 2019-01-05 DIAGNOSIS — F32A Depression, unspecified: Secondary | ICD-10-CM

## 2019-01-05 DIAGNOSIS — F419 Anxiety disorder, unspecified: Secondary | ICD-10-CM | POA: Diagnosis not present

## 2019-01-05 MED ORDER — ESCITALOPRAM OXALATE 10 MG PO TABS: 10.0000 mg | ORAL_TABLET | Freq: Every day | ORAL | 1 refills | Status: DC

## 2019-01-05 NOTE — Assessment & Plan Note (Signed)
Chronic for years, increased over the last three months. Did well on Lexapro in the past and would like to resume. PHQ 9 score of 19 and GAD 7 score of 14 today. Denies SI/HI.  Rx for Lexapro 10 mg sent to pharmacy.   Patient is to take 1/2 tablet daily for 4-6 days, then advance to 1 full tablet thereafter. We discussed possible side effects of headache, GI upset, drowsiness, and SI/HI. If thoughts of SI/HI develop, we discussed to present to the emergency immediately. Patient verbalized understanding.   Follow up in 6 weeks for re-evaluation.

## 2019-01-05 NOTE — Progress Notes (Signed)
Subjective:    Patient ID: Loretta Burton, female    DOB: May 08, 1985, 33 y.o.   MRN: [id number]  HPI  Loretta Burton is a 33 year old female with a history of anxiety and depression, insomnia, post partum (April 2019) who presents today to discuss depression.   Previously managed on Lexapro in 2017 and over the last several years she's been able to manage without medications through exercise, self calming techniques, etc.   Over the last three months she's really struggled to control her symptoms. Symptoms include chest tightness, irritability/angry, tearfulness, difficulty sleeping, feeling nervous.   PHQ 9 score of 19 and GAD 7 score of 14 today. Denies SI/HI. She has tried therapy in the past but doesn't have the time to contribute.   Review of Systems  Respiratory: Negative for shortness of breath.   Cardiovascular: Negative for chest pain.  Psychiatric/Behavioral: Positive for sleep disturbance. The patient is nervous/anxious.        See HPI       Past Medical History:  Diagnosis Date  . Allergy    seasonal  . Anemia   . Cholelithiasis   . Depression   . Insomnia   . Panic attack      Social History   Socioeconomic History  . Marital status: Married    Spouse name: Not on file  . Number of children: Not on file  . Years of education: Not on file  . Highest education level: Not on file  Occupational History  . Not on file  Social Needs  . Financial resource strain: Not on file  . Food insecurity    Worry: Not on file    Inability: Not on file  . Transportation needs    Medical: Not on file    Non-medical: Not on file  Tobacco Use  . Smoking status: Never Smoker  . Smokeless tobacco: Never Used  Substance and Sexual Activity  . Alcohol use: No  . Drug use: No  . Sexual activity: Yes    Partners: Male    Birth control/protection: None  Lifestyle  . Physical activity    Days per week: Not on file    Minutes per session: Not on file  . Stress: Not on  file  Relationships  . Social Herbalist on phone: Not on file    Gets together: Not on file    Attends religious service: Not on file    Active member of club or organization: Not on file    Attends meetings of clubs or organizations: Not on file    Relationship status: Not on file  . Intimate partner violence    Fear of current or ex partner: Not on file    Emotionally abused: Not on file    Physically abused: Not on file    Forced sexual activity: Not on file  Other Topics Concern  . Not on file  Social History Narrative   Married.   1 daughter.   Works at [employer] as a Quarry manager.   Enjoys crafting, spending time with her daughter    Past Surgical History:  Procedure Laterality Date  . CHOLECYSTECTOMY N/A 11/28/2014   Procedure: LAPAROSCOPIC CHOLECYSTECTOMY WITH INTRAOPERATIVE CHOLANGIOGRAM;  Surgeon: Florene Glen, MD;  Location: [facility] ORS;  Service: General;  Laterality: N/A;  . WISDOM TOOTH EXTRACTION     x4    Family History  Problem Relation Age of Onset  . Diabetes Mother   .  Hypertension Mother   . Depression Mother   . Diabetes Maternal Grandmother   . Hypertension Maternal Grandmother   . Heart disease Maternal Grandmother   . Cancer Maternal Grandmother        skin  . Diabetes Paternal Grandmother   . Hypertension Paternal Grandmother   . Heart disease Paternal Grandmother   . Diabetes Paternal Grandfather   . Hypertension Paternal Grandfather   . Heart disease Paternal Grandfather     Allergies  Allergen Reactions  . Adhesive [Tape] Itching  . Ciprofloxacin Hives and Nausea And Vomiting    Current Outpatient Medications on File Prior to Visit  Medication Sig Dispense Refill  . ferrous sulfate 325 (65 FE) MG tablet Take 1 tablet (325 mg total) by mouth daily with breakfast. (Patient not taking: Reported on 01/05/2019)     No current facility-administered medications on file prior to visit.     BP 110/64   Pulse 98   Temp 98.1  F (36.7 C) (Temporal)   Ht 5\' 4"  (1.626 m)   Wt 233 lb 4 oz (105.8 kg)   SpO2 99%   BMI 40.04 kg/m    Objective:   Physical Exam  Constitutional: She appears well-nourished.  Neck: Neck supple.  Cardiovascular: Normal rate and regular rhythm.  Respiratory: Effort normal and breath sounds normal.  Skin: Skin is warm and dry.  Psychiatric: She has a normal mood and affect.           Assessment & Plan:

## 2019-01-05 NOTE — Patient Instructions (Addendum)
Start escitalopram (Lexapro) 10 mg tablets once daily for anxiety and depression. Start by taking 1/2 tablet daily for 6 days, then increase to 1 full tablet thereafter.  We will see you soon for your physical, message me if you have any questions.   It was a pleasure to see you today!

## 2019-01-26 ENCOUNTER — Other Ambulatory Visit: Payer: Self-pay

## 2019-01-26 ENCOUNTER — Ambulatory Visit (INDEPENDENT_AMBULATORY_CARE_PROVIDER_SITE_OTHER): Payer: Managed Care, Other (non HMO) | Admitting: Primary Care

## 2019-01-26 ENCOUNTER — Encounter: Payer: Self-pay | Admitting: Primary Care

## 2019-01-26 VITALS — BP 110/78 | HR 98 | Temp 97.9°F | Ht 64.0 in | Wt 222.5 lb

## 2019-01-26 DIAGNOSIS — Z23 Encounter for immunization: Secondary | ICD-10-CM

## 2019-01-26 DIAGNOSIS — F419 Anxiety disorder, unspecified: Secondary | ICD-10-CM

## 2019-01-26 DIAGNOSIS — Z0000 Encounter for general adult medical examination without abnormal findings: Secondary | ICD-10-CM | POA: Diagnosis not present

## 2019-01-26 DIAGNOSIS — F329 Major depressive disorder, single episode, unspecified: Secondary | ICD-10-CM

## 2019-01-26 DIAGNOSIS — G4700 Insomnia, unspecified: Secondary | ICD-10-CM | POA: Diagnosis not present

## 2019-01-26 LAB — COMPREHENSIVE METABOLIC PANEL
ALT: 20 U/L (ref 0–35)
AST: 16 U/L (ref 0–37)
Albumin: 4.6 g/dL (ref 3.5–5.2)
Alkaline Phosphatase: 60 U/L (ref 39–117)
BUN: 9 mg/dL (ref 6–23)
CO2: 27 mEq/L (ref 19–32)
Calcium: 9.7 mg/dL (ref 8.4–10.5)
Chloride: 103 mEq/L (ref 96–112)
Creatinine, Ser: 0.58 mg/dL (ref 0.40–1.20)
GFR: 120.67 mL/min (ref >60.00)
Glucose, Bld: 84 mg/dL (ref 70–99)
Potassium: 3.9 mEq/L (ref 3.5–5.1)
Sodium: 138 mEq/L (ref 135–145)
Total Bilirubin: 0.4 mg/dL (ref 0.2–1.2)
Total Protein: 7.7 g/dL (ref 6.0–8.3)

## 2019-01-26 LAB — LIPID PANEL
Cholesterol: 202 mg/dL — ABNORMAL HIGH (ref 0–200)
HDL: 39.3 mg/dL (ref >39.00)
NonHDL: 162.89
Total CHOL/HDL Ratio: 5
Triglycerides: 254 mg/dL — ABNORMAL HIGH (ref 0.0–149.0)
VLDL: 50.8 mg/dL — ABNORMAL HIGH (ref 0.0–40.0)

## 2019-01-26 LAB — LDL CHOLESTEROL, DIRECT: Direct LDL: 136 mg/dL

## 2019-01-26 NOTE — Progress Notes (Signed)
Subjective:    Patient ID: Loretta Burton, female    DOB: 1985-07-15, 33 y.o.   MRN: [id number]  HPI  Loretta Burton is a 33 year old female who presents today for complete physical and follow up of anxiety and depression.  Evaluated on 01/05/19 for return of chronic anxiety/depression symptoms. Over the last three months she's had a tough time managing symptoms on her own. GAD 7 score of 14 and PHQ 9 score of 19 so Lexapro 10 mg was initiated.  Since her last visit she's already noticed improvement. She is no longer as angry/irritable, sleeping has improved, chest tightness has reduced. She denies SI/HI.   Immunizations: -Tetanus: Completed in 2019 -Influenza: Due today  Diet: She endorses a poor diet. She is eating a lot of junk food. Desserts daily. She is drinking coffee, sweet tea. Exercise: She is not exercising.   Eye exam: Completed in 2020 Dental exam: Completes semi-annually  Pap Smear: Completed in 2018  BP Readings from Last 3 Encounters:  01/26/19 110/78  01/05/19 110/64  01/24/18 108/60      Review of Systems  Constitutional: Negative for unexpected weight change.  HENT: Negative for rhinorrhea.   Respiratory: Negative for cough and shortness of breath.   Cardiovascular: Negative for chest pain.  Gastrointestinal: Negative for diarrhea.       Occasional constipation   Genitourinary: Negative for difficulty urinating and menstrual problem.  Musculoskeletal: Negative for arthralgias and myalgias.  Skin: Negative for rash.  Allergic/Immunologic: Negative for environmental allergies.  Neurological: Positive for headaches. Negative for dizziness and numbness.       Recent migraines, overall infrequent  Psychiatric/Behavioral: Negative for sleep disturbance.       Doing much better on Lexapro 10 mg already. Denies SI/HI       Past Medical History:  Diagnosis Date   Allergy    seasonal   Anemia    Cholelithiasis    Depression    Depression  affecting pregnancy 12/24/2016   Insomnia    Obesity affecting pregnancy 12/24/2016   Panic attack      Social History   Socioeconomic History   Marital status: Married    Spouse name: Not on file   Number of children: Not on file   Years of education: Not on file   Highest education level: Not on file  Occupational History   Not on file  Social Needs   Financial resource strain: Not on file   Food insecurity    Worry: Not on file    Inability: Not on file   Transportation needs    Medical: Not on file    Non-medical: Not on file  Tobacco Use   Smoking status: Never Smoker   Smokeless tobacco: Never Used  Substance and Sexual Activity   Alcohol use: No   Drug use: No   Sexual activity: Yes    Partners: Male    Birth control/protection: None  Lifestyle   Physical activity    Days per week: Not on file    Minutes per session: Not on file   Stress: Not on file  Relationships   Social connections    Talks on phone: Not on file    Gets together: Not on file    Attends religious service: Not on file    Active member of club or organization: Not on file    Attends meetings of clubs or organizations: Not on file    Relationship status: Not on file  Intimate partner violence    Fear of current or ex partner: Not on file    Emotionally abused: Not on file    Physically abused: Not on file    Forced sexual activity: Not on file  Other Topics Concern   Not on file  Social History Narrative   Married.   1 daughter.   Works at [employer] as a Lawyer.   Enjoys crafting, spending time with her daughter    Past Surgical History:  Procedure Laterality Date   CHOLECYSTECTOMY N/A 11/28/2014   Procedure: LAPAROSCOPIC CHOLECYSTECTOMY WITH INTRAOPERATIVE CHOLANGIOGRAM;  Surgeon: Lattie Haw, MD;  Location: [facility] ORS;  Service: General;  Laterality: N/A;   WISDOM TOOTH EXTRACTION     x4    Family History  Problem Relation Age of Onset    Diabetes Mother    Hypertension Mother    Depression Mother    Diabetes Maternal Grandmother    Hypertension Maternal Grandmother    Heart disease Maternal Grandmother    Cancer Maternal Grandmother        skin   Diabetes Paternal Grandmother    Hypertension Paternal Grandmother    Heart disease Paternal Grandmother    Diabetes Paternal Grandfather    Hypertension Paternal Grandfather    Heart disease Paternal Grandfather     Allergies  Allergen Reactions   Adhesive [Tape] Itching   Ciprofloxacin Hives and Nausea And Vomiting    Current Outpatient Medications on File Prior to Visit  Medication Sig Dispense Refill   escitalopram (LEXAPRO) 10 MG tablet Take 1 tablet (10 mg total) by mouth daily. For anxiety and depression. 90 tablet 1   ferrous sulfate 325 (65 FE) MG tablet Take 1 tablet (325 mg total) by mouth daily with breakfast. (Patient not taking: Reported on 01/05/2019)     No current facility-administered medications on file prior to visit.     BP 110/78    Pulse 98    Temp 97.9 F (36.6 C) (Temporal)    Ht 5\' 4"  (1.626 m)    Wt 222 lb 8 oz (100.9 kg)    SpO2 98%    BMI 38.19 kg/m    Objective:   Physical Exam  Constitutional: She is oriented to person, place, and time. She appears well-nourished.  HENT:  Right Ear: Tympanic membrane and ear canal normal.  Left Ear: Tympanic membrane and ear canal normal.  Mouth/Throat: Oropharynx is clear and moist.  Eyes: Pupils are equal, round, and reactive to light. EOM are normal.  Neck: Neck supple.  Cardiovascular: Normal rate and regular rhythm.  Respiratory: Effort normal and breath sounds normal.  GI: Soft. Bowel sounds are normal. There is no abdominal tenderness.  Musculoskeletal: Normal range of motion.  Neurological: She is alert and oriented to person, place, and time.  Skin: Skin is warm and dry.  Psychiatric: She has a normal mood and affect.           Assessment & Plan:

## 2019-01-26 NOTE — Patient Instructions (Signed)
Stop by the lab prior to leaving today. I will notify you of your results once received.  ° °Start exercising. You should be getting 150 minutes of moderate intensity exercise weekly. ° °It's important to improve your diet by reducing consumption of fast food, fried food, processed snack foods, sugary drinks. Increase consumption of fresh vegetables and fruits, whole grains, water. ° °Ensure you are drinking 64 ounces of water daily. ° °It was a pleasure to see you today! ° ° °Preventive Care 31-39 Years Old, Female °Preventive care refers to visits with your health care provider and lifestyle choices that can promote health and wellness. This includes: °· A yearly physical exam. This may also be called an annual well check. °· Regular dental visits and eye exams. °· Immunizations. °· Screening for certain conditions. °· Healthy lifestyle choices, such as eating a healthy diet, getting regular exercise, not using drugs or products that contain nicotine and tobacco, and limiting alcohol use. °What can I expect for my preventive care visit? °Physical exam °Your health care provider will check your: °· Height and weight. This may be used to calculate body mass index (BMI), which tells if you are at a healthy weight. °· Heart rate and blood pressure. °· Skin for abnormal spots. °Counseling °Your health care provider may ask you questions about your: °· Alcohol, tobacco, and drug use. °· Emotional well-being. °· Home and relationship well-being. °· Sexual activity. °· Eating habits. °· Work and work environment. °· Method of birth control. °· Menstrual cycle. °· Pregnancy history. °What immunizations do I need? ° °Influenza (flu) vaccine °· This is recommended every year. °Tetanus, diphtheria, and pertussis (Tdap) vaccine °· You may need a Td booster every 10 years. °Varicella (chickenpox) vaccine °· You may need this if you have not been vaccinated. °Human papillomavirus (HPV) vaccine °· If recommended by your health  care provider, you may need three doses over 6 months. °Measles, mumps, and rubella (MMR) vaccine °· You may need at least one dose of MMR. You may also need a second dose. °Meningococcal conjugate (MenACWY) vaccine °· One dose is recommended if you are age 19-21 years and a first-year college student living in a residence hall, or if you have one of several medical conditions. You may also need additional booster doses. °Pneumococcal conjugate (PCV13) vaccine °· You may need this if you have certain conditions and were not previously vaccinated. °Pneumococcal polysaccharide (PPSV23) vaccine °· You may need one or two doses if you smoke cigarettes or if you have certain conditions. °Hepatitis A vaccine °· You may need this if you have certain conditions or if you travel or work in places where you may be exposed to hepatitis A. °Hepatitis B vaccine °· You may need this if you have certain conditions or if you travel or work in places where you may be exposed to hepatitis B. °Haemophilus influenzae type b (Hib) vaccine °· You may need this if you have certain conditions. °You may receive vaccines as individual doses or as more than one vaccine together in one shot (combination vaccines). Talk with your health care provider about the risks and benefits of combination vaccines. °What tests do I need? ° °Blood tests °· Lipid and cholesterol levels. These may be checked every 5 years starting at age 20. °· Hepatitis C test. °· Hepatitis B test. °Screening °· Diabetes screening. This is done by checking your blood sugar (glucose) after you have not eaten for a while (fasting). °· Sexually transmitted disease (  STD) testing. °· BRCA-related cancer screening. This may be done if you have a family history of breast, ovarian, tubal, or peritoneal cancers. °· Pelvic exam and Pap test. This may be done every 3 years starting at age 31. Starting at age 30, this may be done every 5 years if you have a Pap test in combination with  an HPV test. °Talk with your health care provider about your test results, treatment options, and if necessary, the need for more tests. °Follow these instructions at home: °Eating and drinking ° °· Eat a diet that includes fresh fruits and vegetables, whole grains, lean protein, and low-fat dairy. °· Take vitamin and mineral supplements as recommended by your health care provider. °· Do not drink alcohol if: °? Your health care provider tells you not to drink. °? You are pregnant, may be pregnant, or are planning to become pregnant. °· If you drink alcohol: °? Limit how much you have to 0-1 drink a day. °? Be aware of how much alcohol is in your drink. In the [place] one drink equals one 12 oz bottle of beer (355 mL), one 5 oz glass of wine (148 mL), or one 1½ oz glass of hard liquor (44 mL). °Lifestyle °· Take daily care of your teeth and gums. °· Stay active. Exercise for at least 30 minutes on 5 or more days each week. °· Do not use any products that contain nicotine or tobacco, such as cigarettes, e-cigarettes, and chewing tobacco. If you need help quitting, ask your health care provider. °· If you are sexually active, practice safe sex. Use a condom or other form of birth control (contraception) in order to prevent pregnancy and STIs (sexually transmitted infections). If you plan to become pregnant, see your health care provider for a preconception visit. °What's next? °· Visit your health care provider once a year for a well check visit. °· Ask your health care provider how often you should have your eyes and teeth checked. °· Stay up to date on all vaccines. °This information is not intended to replace advice given to you by your health care provider. Make sure you discuss any questions you have with your health care provider. °Document Released: 06/02/2001 Document Revised: 12/16/2017 Document Reviewed: 12/16/2017 °Elsevier Patient Education © 2020 [employer] Inc. ° °

## 2019-01-26 NOTE — Assessment & Plan Note (Signed)
Tetanus UTD, influenza vaccination provided. Pap smear UTD, due in 2021. Discussed the importance of a healthy diet and regular exercise in order for weight loss, and to reduce the risk of any potential medical problems. Exam today unremarkable. Labs pending. Follow up in 1 year for CPE.

## 2019-01-26 NOTE — Assessment & Plan Note (Signed)
Improved already on Lexapro 10 mg, denies SI/HI. Continue same, she will update.

## 2019-01-26 NOTE — Assessment & Plan Note (Signed)
Improved with Lexapro. Continue to monitor.

## 2019-01-26 NOTE — Addendum Note (Signed)
Addended by: Jacqualin Combes on: 01/26/2019 02:17 PM   Modules accepted: Orders

## 2019-01-27 LAB — CBC
HCT: 39.1 % (ref 36.0–46.0)
Hemoglobin: 13.3 g/dL (ref 12.0–15.0)
MCHC: 33.9 g/dL (ref 30.0–36.0)
MCV: 90.3 fl (ref 78.0–100.0)
Platelets: 241 10*3/uL (ref 150.0–400.0)
RBC: 4.33 Mil/uL (ref 3.87–5.11)
RDW: 12.7 % (ref 11.5–15.5)
WBC: 7.4 10*3/uL (ref 4.0–10.5)

## 2019-03-17 DIAGNOSIS — F419 Anxiety disorder, unspecified: Secondary | ICD-10-CM

## 2019-03-17 DIAGNOSIS — F329 Major depressive disorder, single episode, unspecified: Secondary | ICD-10-CM

## 2019-03-20 MED ORDER — ESCITALOPRAM OXALATE 10 MG PO TABS: 20.0000 mg | ORAL_TABLET | Freq: Every day | ORAL | 1 refills | Status: DC

## 2019-03-21 IMAGING — US US RENAL
1 series · 14 of 25 positions shown · non-contrast
Comparison: Right upper quadrant ultrasound 11/26/2014.

CLINICAL DATA: 31-year-old female.

Bilateral flank pain and intermittent right groin pain for 1 month
with microscopic hematuria.
Pregnant in the first trimester.
EXAM:
RENAL / URINARY TRACT ULTRASOUND COMPLETE

[Series 1: us renal · 0.28mm/px · 14 of 35 slices shown]
[im 1/35]
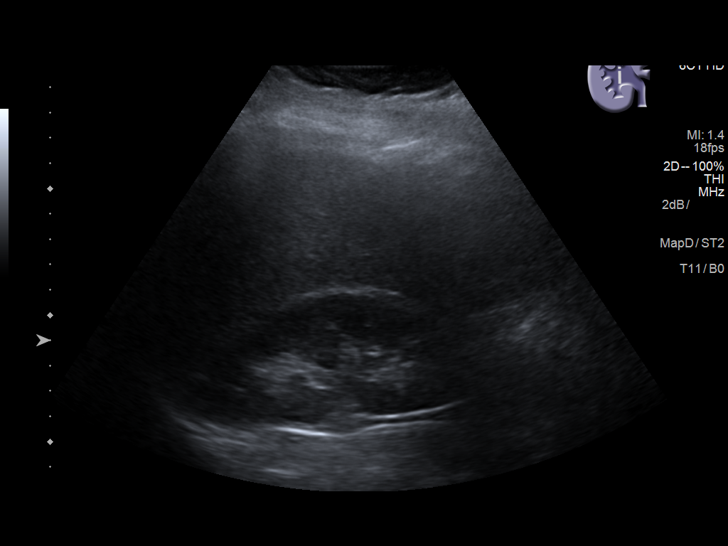
[im 3/35]
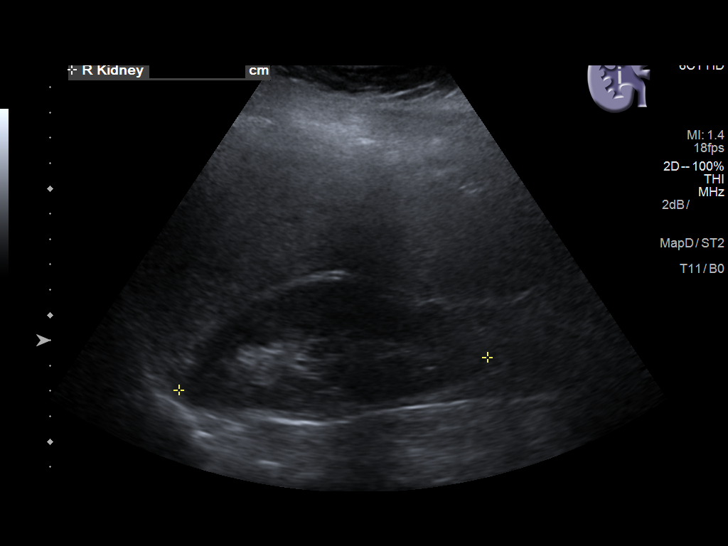
[im 6/35]
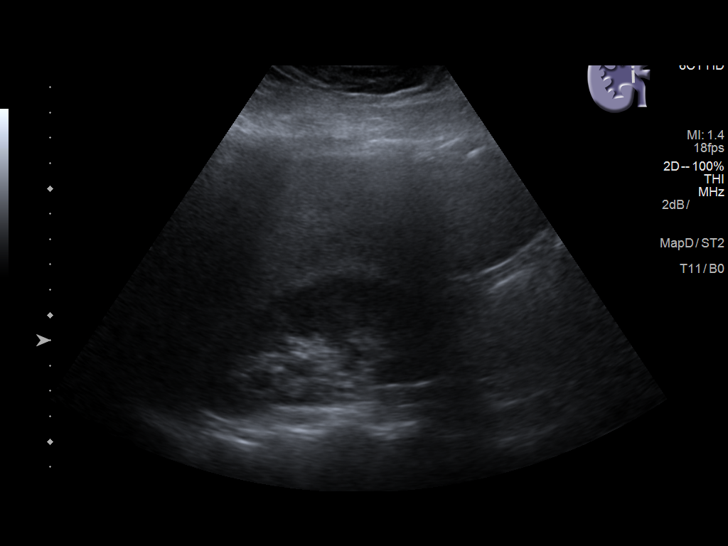
[im 9/35]
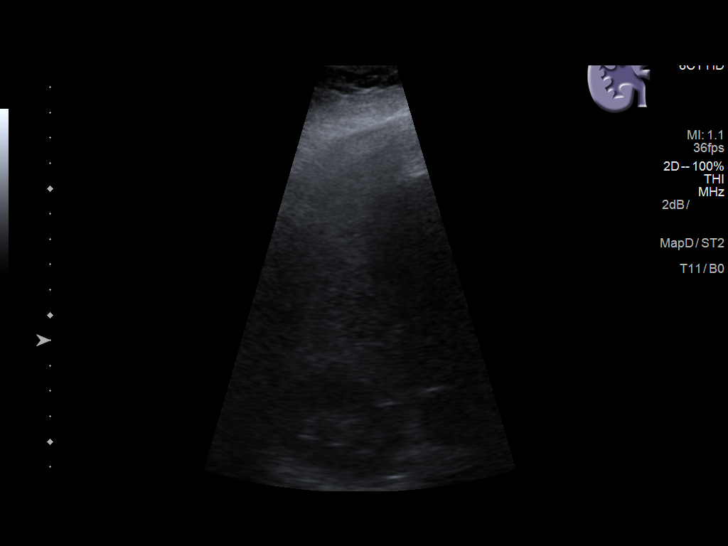
[im 12/35]
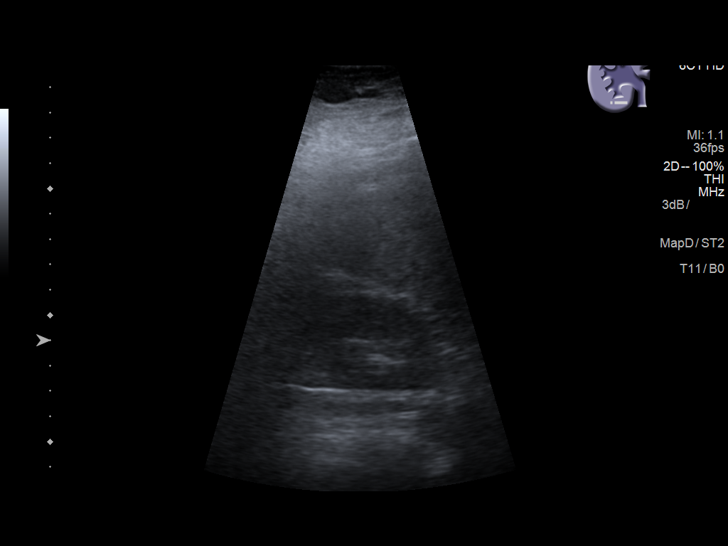
[im 13/35]
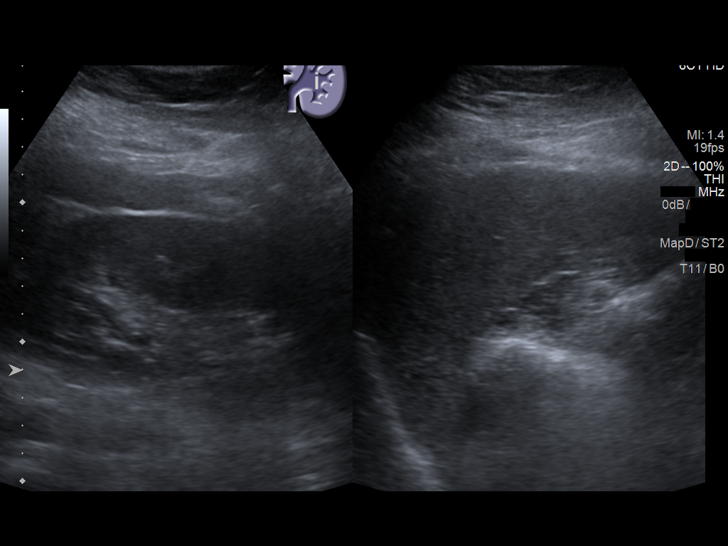
[im 16/35]
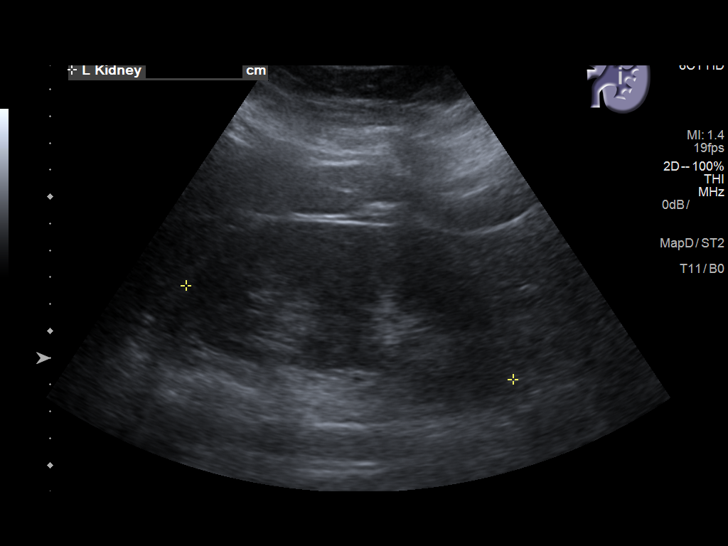
[im 19/35]
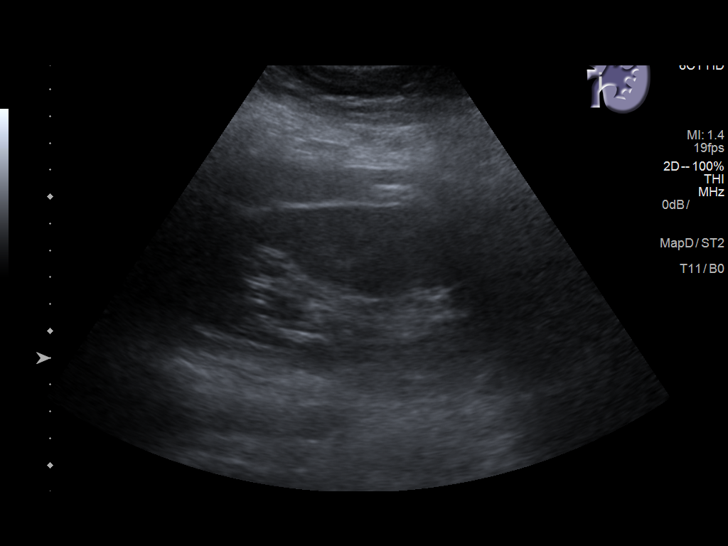
[im 22/35]
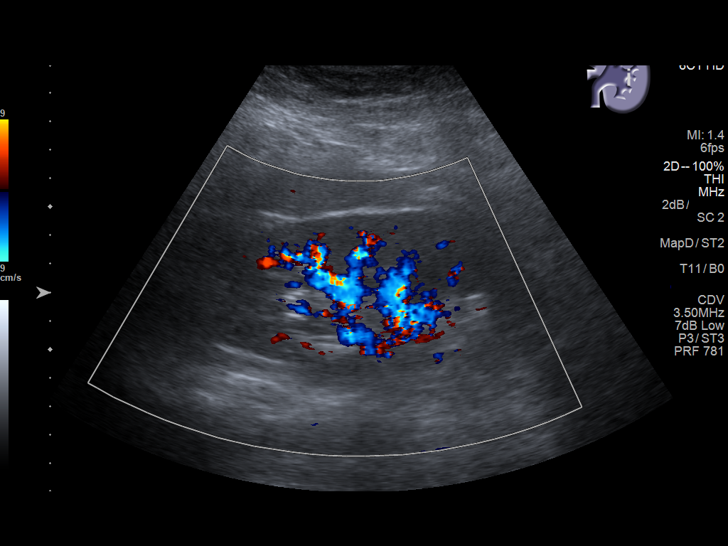
[im 23/35]
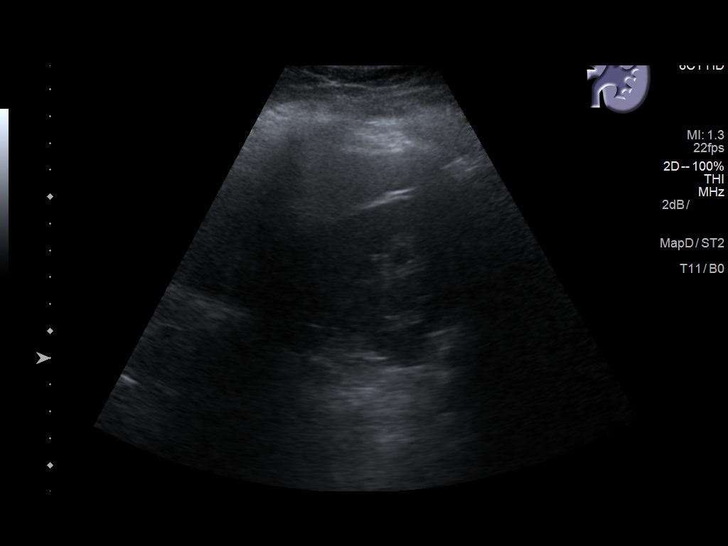
[im 26/35]
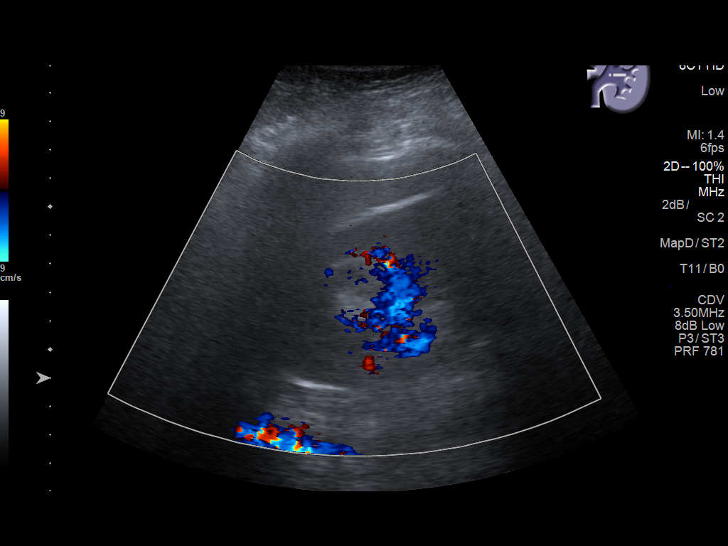
[im 29/35]
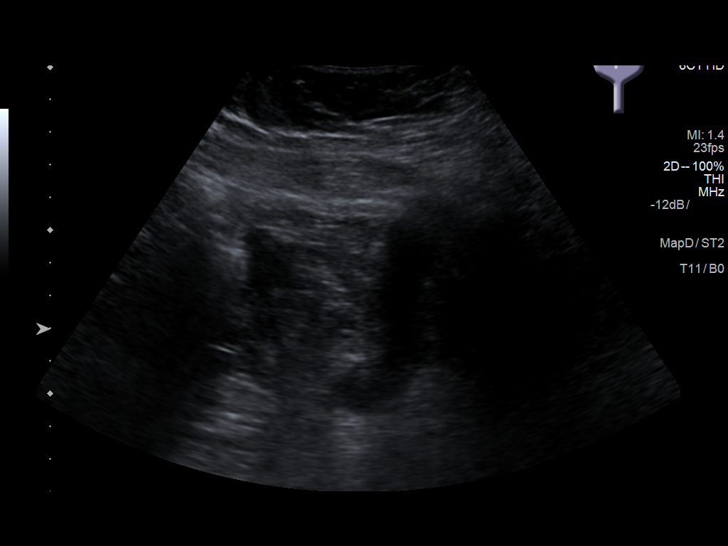
[im 32/35]
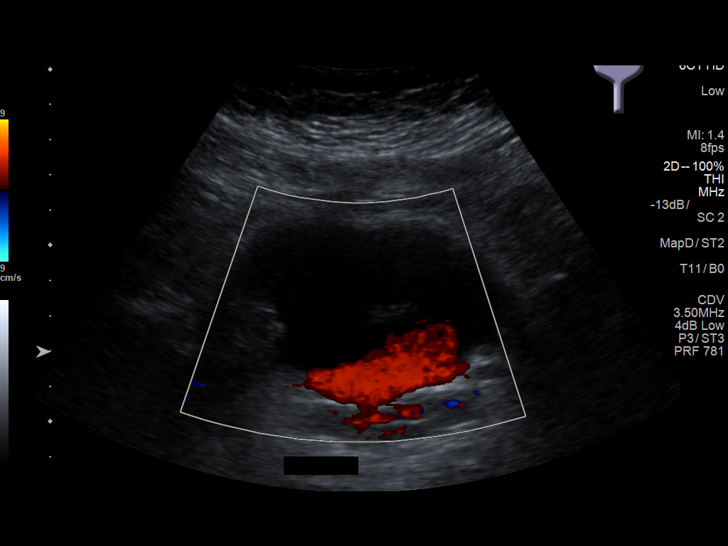
[im 35/35]
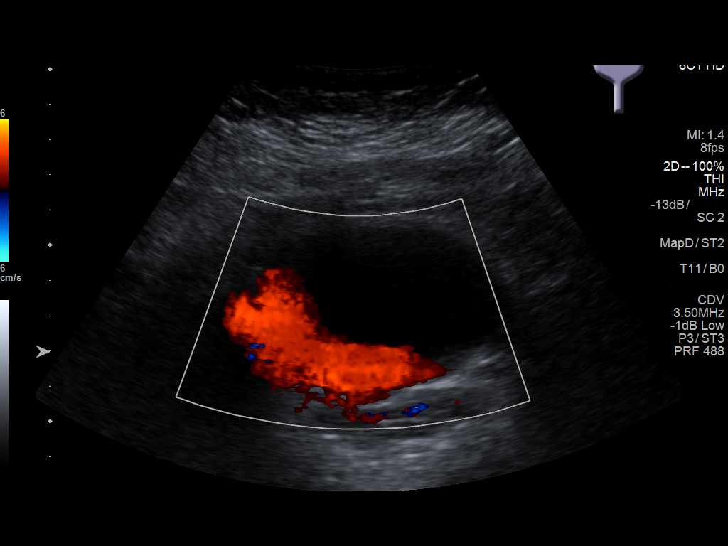

[14 of 25 positions shown; findings below may reference images not displayed]

FINDINGS: Right Kidney:

Length: 12.3 cm. Echogenicity within normal limits. No mass or
hydronephrosis visualized.

Left Kidney:

Length: 12.7 cm. Echogenicity within normal limits. No mass or
hydronephrosis visualized.

Bladder:

Diminutive. Appears normal for degree of bladder distention. Both
ureteral jets detected with Doppler.

Other findings: Possible hepatic steatosis (image 9).
IMPRESSION: Normal ultrasound appearance of both kidneys in the urinary bladder.

## 2019-05-04 ENCOUNTER — Other Ambulatory Visit: Payer: Self-pay | Admitting: Primary Care

## 2019-05-04 ENCOUNTER — Other Ambulatory Visit: Payer: Self-pay

## 2019-05-04 MED ORDER — ESCITALOPRAM OXALATE 20 MG PO TABS: 20.0000 mg | ORAL_TABLET | Freq: Every day | ORAL | 2 refills | Status: DC

## 2019-05-04 NOTE — Telephone Encounter (Signed)
Per mychart message patient could increase to 20 mg Lexapro. I have sent to [facility] for patient.

## 2019-05-04 NOTE — Telephone Encounter (Signed)
Pt called and left a voicemail requesting a refill of her Lexapro. She is requesting the that new dose be refilled - increased last OV to 20mg . She has completed her 10mg  tablets that she was taking 2 daily (20mg ) and is requesting that the new Rx be sent to the pharmacy.   [facility] [address]

## 2019-05-11 MED ORDER — ESCITALOPRAM OXALATE 20 MG PO TABS: 20.0000 mg | ORAL_TABLET | Freq: Every day | ORAL | 2 refills | Status: DC

## 2019-05-12 ENCOUNTER — Telehealth: Payer: Self-pay

## 2019-05-12 DIAGNOSIS — M545 Low back pain, unspecified: Secondary | ICD-10-CM

## 2019-05-12 MED ORDER — DICLOFENAC SODIUM 75 MG PO TBEC: 75.0000 mg | DELAYED_RELEASE_TABLET | Freq: Two times a day (BID) | ORAL | 0 refills | Status: DC | PRN

## 2019-05-12 NOTE — Telephone Encounter (Signed)
Patient states on 05/09/19 and she was bending down to pick up some trash and when she stood back up she felt pain in her right lower back. She states the pain progressed and she ended up going to her chiropractor 2 times now. Pain is radiating down to her bilateral hips and legs. She is also having muscle spasm in the right lower back. Chiropractor told patient to let us know and see if she can get something prescribed that is stronger than Ibuprofen but she does not want anything sedative or be drugged up because she has to work. Patient is going back to see chiropractor 3rd time next week again.  Patient states she has to work and can not call out-she works in Teacher, music. She would like to avoid a visit because she is already paying $60 dollars to chiropractor each time. Advised patient I would check with PCP and we would get back to her with recommendations. (uses [address])  Land information : [place]und[place] Miu [phone]

## 2019-05-12 NOTE — Telephone Encounter (Signed)
Spoken and notified patient of Kate Clark's comments. Patient verbalized understanding.  

## 2019-05-12 NOTE — Telephone Encounter (Signed)
Please notify patient that I'm sending in diclofenac 75 mg. Take 1 tablet BID as needed. Do not take Ibuprofen when taking diclofenac. Okay to take Tylenol in between. If no improvement then I need to see her. Thanks!

## 2019-08-14 ENCOUNTER — Telehealth: Payer: Self-pay

## 2019-08-14 NOTE — Telephone Encounter (Signed)
Noted and agree. Glad to know they are doing better!

## 2019-08-14 NOTE — Telephone Encounter (Signed)
[facility] Night - Client TELEPHONE ADVICE RECORD [facility] Patient Name: SHANDRA SZYMBORSKI Gender: Female DOB: 30-Dec-1985 Age: 34 Y 3 M 15 D Return Phone Number: [phone] (Primary), [phone] (Secondary) Address: City/State/Zip: [place] [address] Client [facility] Night - Client Client Site [facility] - Night Physician Vernona Rieger - NP Contact Type Call Who Is Calling Patient / Member / Family / Caregiver Call Type Triage / Clinical Caller Name [facility] Relationship To Patient Spouse Return Phone Number [phone] (Primary) Chief Complaint Vomiting Reason for Call Symptomatic / Request for Health Information Initial Comment Caller states wife has a stomach virus in house. She is vomiting and has diarrhea. She has a temp 100.2. She lost her voice yesterday. Translation No Nurse Assessment Nurse: Mayford Knife, RN, Lupe Carney Date/Time (Eastern Time): 08/12/2019 6:58:01 PM Confirm and document reason for call. If symptomatic, describe symptoms. ---Caller states wife has a stomach virus in house. She is vomiting and has diarrhea. She has a temp 100.2. She lost her voice yesterday. Has had a cough. Unable to hold down any fluids. Has the patient had close contact with a person known or suspected to have the novel coronavirus illness OR traveled / lives in area with major community spread (including international travel) in the last 14 days from the onset of symptoms? * If Asymptomatic, screen for exposure and travel within the last 14 days. ---No Does the patient have any new or worsening symptoms? ---Yes Will a triage be completed? ---Yes Related visit to physician within the last 2 weeks? ---No Does the PT have any chronic conditions? (i.e. diabetes, asthma, this includes High risk factors for pregnancy, etc.) ---No Is the patient pregnant or possibly pregnant? (Ask all females between the ages  of 70-55) ---No Is this a behavioral health or substance abuse call? ---No Guidelines Guideline Title Affirmed Question Affirmed Notes Nurse Date/Time (Eastern Time) Vomiting [1] SEVERE vomiting (e.g., 6 or more times/ day) AND [2] present > 8 hours (Exception: patient Mayford Knife, RN, Rebekah 08/12/2019 7:00:21 PMPLEASE NOTE: All timestamps contained within this report are represented as [place] Standard Time. CONFIDENTIALTY NOTICE: This fax transmission is intended only for the addressee. It contains information that is legally privileged, confidential or otherwise protected from use or disclosure. If you are not the intended recipient, you are strictly prohibited from reviewing, disclosing, copying using or disseminating any of this information or taking any action in reliance on or regarding this information. If you have received this fax in error, please notify us immediately by telephone so that we can arrange for its return to [place]. Phone: [phone], Toll-Free: [phone], Fax: [phone] Page: 2 of 2 Call Id: [id number] Guidelines Guideline Title Affirmed Question Affirmed Notes Nurse Date/Time Lamount Cohen Time) sounds well, is drinking liquids, does not sound dehydrated, and vomiting has lasted less than 24 hours) Disp. Time Lamount Cohen Time) Disposition Final User 08/12/2019 7:03:44 PM Go to ED Now (or PCP triage) Yes Mayford Knife, RN, Lupe Carney Caller Disagree/Comply Comply Caller Understands Yes PreDisposition Call Doctor Care Advice Given Per Guideline GO TO ED NOW (OR PCP TRIAGE): * IF NO PCP (PRIMARY CARE PROVIDER) SECOND-LEVEL TRIAGE: You need to be seen within the next hour. Go to the ED/UCC at _____________ Hospital. Leave as soon as you can. CARE ADVICE per Vomiting (Adult) guideline. Referrals GO TO FACILITY UNDECIDED

## 2019-08-14 NOTE — Telephone Encounter (Signed)
Pt reports vomiting and diarrhea for about 24 hours on Sat with slight fever, 100.2, cough and fatigue. Pt was tested for covid on 4/25 and it is neg. Pt reports her two children and husband have had similar symptoms but she was afraid she was getting dehydrated and thought of going to the ER but did not have transportation. Pt reports feeling better today and she is using the BRAT diet and denies any vomiting or diarrhea today. No fever or cough, but pt still has some fatigue. Pt reports the entire family is doing better. She also reports that her and her husband had the noravirus a couple of years ago and the feeling was very similar to it. Advised if any symptoms worsen or she has any new symptoms to contact this office. Pt verbalized understanding.

## 2019-11-21 ENCOUNTER — Other Ambulatory Visit: Payer: Self-pay | Admitting: Primary Care

## 2020-01-19 ENCOUNTER — Telehealth: Payer: Self-pay | Admitting: *Deleted

## 2020-01-19 ENCOUNTER — Other Ambulatory Visit: Payer: Self-pay | Admitting: Primary Care

## 2020-01-19 DIAGNOSIS — U071 COVID-19: Secondary | ICD-10-CM

## 2020-01-19 NOTE — Telephone Encounter (Signed)
Patient called stating that she has covid. Patient stated she was feeling bad Monday so she did a home test and it was negative. Patient stated she has had a headache, fever, sore throat, head and chest congestion. Patient stated she went to [facility] walk-in clinic Wednesday because she was having some wheezing. Patient stated that she was given doxycycline because she was diagnosed with bronchitis. Patient stated that she started feeling worse so she did another home covid test yesterday and it was positive. Patient stated that she has a severe headache, head and chest congestion fever running 99.5-100.5 and that is with her taking tylenol around the clock. Patient stated that her employer told her that she needed to contact her PCP. Patient was advised that this message will go back to Mayra Reel NP and she will review this during her lunchtime. Patient was advised that she may qualify for the infusion and she verbalized understanding. Patient was given ER precautions. Patient was advised that she needs to be in quarantine and she stated that she was already aware of that.

## 2020-01-19 NOTE — Telephone Encounter (Signed)
Spoke with patient regarding symptoms and positive COVID diagnosis.  She qualifies for monoclonal antibody infusion and was scheduled for 9:30 AM on 01/20/2020.  She will get a copy of her COVID-19 results.  Directions and instructions provided via phone in my chart.

## 2020-01-19 NOTE — Progress Notes (Signed)
I connected by phone with Loretta Burton on 01/19/2020 at 1:40 PM to discuss the potential use of a new treatment for mild to moderate COVID-19 viral infection in non-hospitalized patients.  This patient is a 34 y.o. female that meets the FDA criteria for Emergency Use Authorization of COVID monoclonal antibody casirivimab/imdevimab or bamlanivimab/eteseviamb.  Has a (+) direct SARS-CoV-2 viral test result  Has mild or moderate COVID-19   Is NOT hospitalized due to COVID-19  Is within 10 days of symptom onset  Has at least one of the high risk factor(s) for progression to severe COVID-19 and/or hospitalization as defined in EUA.  Specific high risk criteria : BMI > 25   I have spoken and communicated the following to the patient or parent/caregiver regarding COVID monoclonal antibody treatment:  1. FDA has authorized the emergency use for the treatment of mild to moderate COVID-19 in adults and pediatric patients with positive results of direct SARS-CoV-2 viral testing who are 52 years of age and older weighing at least 40 kg, and who are at high risk for progressing to severe COVID-19 and/or hospitalization.  2. The significant known and potential risks and benefits of COVID monoclonal antibody, and the extent to which such potential risks and benefits are unknown.  3. Information on available alternative treatments and the risks and benefits of those alternatives, including clinical trials.  4. Patients treated with COVID monoclonal antibody should continue to self-isolate and use infection control measures (e.g., wear mask, isolate, social distance, avoid sharing personal items, clean and disinfect "high touch" surfaces, and frequent handwashing) according to CDC guidelines.   5. The patient or parent/caregiver has the option to accept or refuse COVID monoclonal antibody treatment.  After reviewing this information with the patient, the patient has agreed to receive one of the  available covid 19 monoclonal antibodies and will be provided an appropriate fact sheet prior to infusion. Doreene Nest, NP 01/19/2020 1:40 PM

## 2020-01-20 ENCOUNTER — Ambulatory Visit (HOSPITAL_COMMUNITY)
Admission: RE | Admit: 2020-01-20 | Discharge: 2020-01-20 | Disposition: A | Discharge status: Home / Self Care | Payer: 59 | Source: Ambulatory Visit | Attending: Pulmonary Disease | Admitting: Pulmonary Disease

## 2020-01-20 DIAGNOSIS — U071 COVID-19: Secondary | ICD-10-CM

## 2020-01-20 MED ORDER — EPINEPHRINE 0.3 MG/0.3ML IJ SOAJ: 0.3000 mg | Freq: Once | INTRAMUSCULAR | Status: DC | PRN

## 2020-01-20 MED ORDER — ALBUTEROL SULFATE HFA 108 (90 BASE) MCG/ACT IN AERS: 2.0000 | INHALATION_SPRAY | Freq: Once | RESPIRATORY_TRACT | Status: DC | PRN

## 2020-01-20 MED ORDER — DIPHENHYDRAMINE HCL 50 MG/ML IJ SOLN: 50.0000 mg | Freq: Once | INTRAMUSCULAR | Status: DC | PRN

## 2020-01-20 MED ORDER — FAMOTIDINE IN NACL 20-0.9 MG/50ML-% IV SOLN: 20.0000 mg | Freq: Once | INTRAVENOUS | Status: DC | PRN

## 2020-01-20 MED ORDER — METHYLPREDNISOLONE SODIUM SUCC 125 MG IJ SOLR: 125.0000 mg | Freq: Once | INTRAMUSCULAR | Status: DC | PRN

## 2020-01-20 MED ORDER — SODIUM CHLORIDE 0.9 % IV SOLN: INTRAVENOUS | Status: DC | PRN

## 2020-01-20 MED ORDER — SODIUM CHLORIDE 0.9 % IV SOLN
1200.0000 mg | Freq: Once | INTRAVENOUS | Status: AC
  Administered 2020-01-20: 1200 mg via INTRAVENOUS

## 2020-01-20 NOTE — Progress Notes (Signed)
  Diagnosis: COVID-19  Physician: Dr Wright  Procedure: Covid Infusion Clinic Med: casirivimab\imdevimab infusion - Provided patient with casirivimab\imdevimab fact sheet for patients, parents and caregivers prior to infusion.  Complications: No immediate complications noted.  Discharge: Discharged home   Loretta Burton 01/20/2020   

## 2020-01-20 NOTE — Discharge Instructions (Signed)

## 2020-01-22 ENCOUNTER — Other Ambulatory Visit (HOSPITAL_COMMUNITY): Payer: Self-pay

## 2020-04-30 ENCOUNTER — Other Ambulatory Visit: Payer: Self-pay | Admitting: Primary Care

## 2020-04-30 DIAGNOSIS — F419 Anxiety disorder, unspecified: Secondary | ICD-10-CM

## 2020-04-30 NOTE — Telephone Encounter (Signed)
Patient must be scheduled for follow-up in order to receive any additional refills. This will be her final warning.

## 2020-04-30 NOTE — Telephone Encounter (Signed)
Pharmacy requests refill on: Escitalopram 20 mg    LAST REFILL: 11/21/2019 (Q-30, R-2) LAST OV: 01/26/2019 NEXT OV: Not Scheduled  PHARMACY: [id number] [place], [place]

## 2020-05-03 NOTE — Telephone Encounter (Signed)
Called patient and scheduled OV for med refill on 1/25

## 2020-05-14 ENCOUNTER — Ambulatory Visit: Payer: 59 | Admitting: Primary Care

## 2020-05-17 ENCOUNTER — Ambulatory Visit: Payer: 59 | Admitting: Primary Care

## 2020-05-17 ENCOUNTER — Other Ambulatory Visit: Payer: Self-pay

## 2020-05-17 DIAGNOSIS — G4700 Insomnia, unspecified: Secondary | ICD-10-CM | POA: Diagnosis not present

## 2020-05-17 DIAGNOSIS — F32A Depression, unspecified: Secondary | ICD-10-CM | POA: Diagnosis not present

## 2020-05-17 DIAGNOSIS — F419 Anxiety disorder, unspecified: Secondary | ICD-10-CM

## 2020-05-17 MED ORDER — ESCITALOPRAM OXALATE 20 MG PO TABS: 20.0000 mg | ORAL_TABLET | Freq: Every day | ORAL | 3 refills | Status: DC

## 2020-05-17 NOTE — Assessment & Plan Note (Signed)
Doing well on Lexapro 20 mg, continue same.  

## 2020-05-17 NOTE — Progress Notes (Signed)
Subjective:    Patient ID: Loretta Burton, female    DOB: 04-Nov-1985, 35 y.o.   MRN: [id number]  HPI  This visit occurred during the SARS-CoV-2 public health emergency.  Safety protocols were in place, including screening questions prior to the visit, additional usage of staff PPE, and extensive cleaning of exam room while observing appropriate contact time as indicated for disinfecting solutions.   Loretta Burton is a 35 year old female with a history of UTI, recurrent sinusitis, anxiety, depression, insomnia who presents today for follow up and medication refills.  1) Anxiety, Insomnia, and Depression: Currently managed on Lexapro 20 mg. Overall had been doing well on Lexapro 20 mg until just recently.   Over the last month she's had some gynecological medical issues, unexplained bleeding. She saw her pap smear results two days ago which showed up "abnormal". She reviewed her results prior to her gynecologist and is waiting to hear back. She hasn't slept in two days due to anxiety with this abnormal lab report.    She has tried therapy in the past which has been effective. She is utilizing techniques learned from therapy to help reduce anxiety about her test result. Denies SI/HI, palpitations, chest pain.  BP Readings from Last 3 Encounters:  05/17/20 110/70  01/20/20 (!) 108/47  01/26/19 110/78     Review of Systems  Respiratory: Negative for shortness of breath.   Cardiovascular: Negative for chest pain and palpitations.  Psychiatric/Behavioral:       See HPI       Past Medical History:  Diagnosis Date  . Allergy    seasonal  . Anemia   . Cholelithiasis   . Decreased fetus movements affecting management of mother in third trimester 08/12/2017  . Depression   . Depression affecting pregnancy 12/24/2016  . Insomnia   . Obesity affecting pregnancy 12/24/2016  . Panic attack   . Supervision of high-risk pregnancy 12/24/2016   Clinic [facility] Prenatal Labs Dating  Blood type:     Genetic Screen 1 Screen:    AFP:     Quad:     NIPS: Antibody:  Anatomic [place]  Rubella:   Varicella:   GTT Early:               Third trimester:  RPR:    Rhogam  HBsAg:    TDaP vaccine                       Flu Shot: HIV:    Baby Food                                GBS:  Contraception  Pap: CBB    CS/VBAC   Support Person            Social History   Socioeconomic History  . Marital status: Married    Spouse name: Not on file  . Number of children: Not on file  . Years of education: Not on file  . Highest education level: Not on file  Occupational History  . Not on file  Tobacco Use  . Smoking status: Never Smoker  . Smokeless tobacco: Never Used  Substance and Sexual Activity  . Alcohol use: No  . Drug use: No  . Sexual activity: Yes    Partners: Male    Birth control/protection: None  Other Topics Concern  . Not on file  Social  History Narrative   Married.   1 daughter.   Works at [employer] as a Lawyer.   Enjoys crafting, spending time with her daughter   Social Determinants of Corporate investment banker Strain: Not on file  Food Insecurity: Not on file  Transportation Needs: Not on file  Physical Activity: Not on file  Stress: Not on file  Social Connections: Not on file  Intimate Partner Violence: Not on file    Past Surgical History:  Procedure Laterality Date  . CHOLECYSTECTOMY N/A 11/28/2014   Procedure: LAPAROSCOPIC CHOLECYSTECTOMY WITH INTRAOPERATIVE CHOLANGIOGRAM;  Surgeon: Lattie Haw, MD;  Location: [facility] ORS;  Service: General;  Laterality: N/A;  . WISDOM TOOTH EXTRACTION     x4    Family History  Problem Relation Age of Onset  . Diabetes Mother   . Hypertension Mother   . Depression Mother   . Diabetes Maternal Grandmother   . Hypertension Maternal Grandmother   . Heart disease Maternal Grandmother   . Cancer Maternal Grandmother        skin  . Diabetes Paternal Grandmother   . Hypertension Paternal Grandmother   . Heart disease  Paternal Grandmother   . Diabetes Paternal Grandfather   . Hypertension Paternal Grandfather   . Heart disease Paternal Grandfather     Allergies  Allergen Reactions  . Adhesive [Tape] Itching  . Ciprofloxacin Hives and Nausea And Vomiting    Current Outpatient Medications on File Prior to Visit  Medication Sig Dispense Refill  . etonogestrel (NEXPLANON) 68 MG IMPL implant 1 each by Subdermal route once.     No current facility-administered medications on file prior to visit.    BP 110/70   Pulse 94   Temp 97.6 F (36.4 C) (Temporal)   Ht 5\' 4"  (1.626 m)   Wt 236 lb 12 oz (107.4 kg)   SpO2 100%   BMI 40.64 kg/m    Objective:   Physical Exam Constitutional:      Appearance: She is well-nourished.  Cardiovascular:     Rate and Rhythm: Normal rate and regular rhythm.  Pulmonary:     Effort: Pulmonary effort is normal.     Breath sounds: Normal breath sounds.  Musculoskeletal:     Cervical back: Neck supple.  Skin:    General: Skin is warm and dry.  Psychiatric:        Mood and Affect: Mood and affect and mood normal.            Assessment & Plan:

## 2020-05-17 NOTE — Patient Instructions (Signed)
I sent refills of Lexapro to your pharmacy.  It was a pleasure to see you today!

## 2020-05-17 NOTE — Assessment & Plan Note (Signed)
Overall doing well on Lexapro 20 mg, she would like to continue as is.   Encouraged her to continue using self calming techniques learned from therapy. Continue Lexapro 20 mg, refills sent to pharmacy.

## 2020-08-01 ENCOUNTER — Telehealth: Payer: Self-pay

## 2020-08-01 NOTE — Telephone Encounter (Signed)
I spoke with pt and she has just gotten to [facility]. Sending note to Allayne Gitelman NP.

## 2020-08-01 NOTE — Telephone Encounter (Signed)
Noted  

## 2020-08-01 NOTE — Telephone Encounter (Signed)
[facility] - Client TELEPHONE ADVICE RECORD [facility] Patient Name: Loretta Burton Gender: Female DOB: 24-Oct-1985 Age: 35 Y 3 M 5 D Return Phone Number: [phone] (Primary), [phone] (Secondary) Address: City/ State/ Zip: [place] [address] Client [facility] - Client Client Site [facility] - Day Physician Vernona Rieger - NP Contact Type Call Who Is Calling Patient / Member / Family / Caregiver Call Type Triage / Clinical Relationship To Patient Self Return Phone Number [phone] (Secondary) Chief Complaint BREATHING - shortness of breath or sounds breathless Reason for Call Symptomatic / Request for Health Information Initial Comment Caller is having chest tightness and has a progressive cough. Caller said she feels like she can`t take a deep breathe and sounds breathless while on the phone. Caller said she has had two covid test and has been using her allergy spray. Translation No Nurse Assessment Nurse: Vear Clock, RN, Alcario Drought Date/Time Lamount Cohen Time): 08/01/2020 9:06:02 AM Confirm and document reason for call. If symptomatic, describe symptoms. ---Caller states she is having a cough that has coughing fits once an hour- during the coughing fits her chest feels tight and has a hard time taking a deep breath. Cough is worsening. Denies chest pain or constant chest tightness. Has had 2 negative covid tests in last 2 weeks with no known exposure. Had lowgrade fever x1 day this week and resolved. Hx of frequent sinus infections that lead to bronchitis. Does the patient have any new or worsening symptoms? ---Yes Will a triage be completed? ---Yes Related visit to physician within the last 2 weeks? ---No Does the PT have any chronic conditions? (i.e. diabetes, asthma, this includes High risk factors for pregnancy, etc.) ---Yes List chronic conditions. ---depression- recently  stopped taking lexapro Is the patient pregnant or possibly pregnant? (Ask all females between the ages of 91-55) ---No Is this a behavioral health or substance abuse call? ---No PLEASE NOTE: All timestamps contained within this report are represented as [place] Standard Time. CONFIDENTIALTY NOTICE: This fax transmission is intended only for the addressee. It contains information that is legally privileged, confidential or otherwise protected from use or disclosure. If you are not the intended recipient, you are strictly prohibited from reviewing, disclosing, copying using or disseminating any of this information or taking any action in reliance on or regarding this information. If you have received this fax in error, please notify us immediately by telephone so that we can arrange for its return to [place]. Phone: [phone], Toll-Free: [phone], Fax: [phone] Page: 2 of 2 Call Id: [id number] Guidelines Guideline Title Affirmed Question Affirmed Notes Nurse Date/Time Lamount Cohen Time) Cough - Acute Non- Productive Wheezing is present Vear Clock, RN, Alcario Drought 08/01/2020 9:10:03 AM Disp. Time Lamount Cohen Time) Disposition Final User 08/01/2020 9:04:06 AM Send to Urgent Queue Gerrie Nordmann 08/01/2020 9:13:59 AM See HCP within 4 Hours (or PCP triage) Yes Vear Clock, RN, Benjaman Lobe Disagree/Comply Comply Caller Understands Yes PreDisposition InappropriateToAsk Care Advice Given Per Guideline SEE HCP (OR PCP TRIAGE) WITHIN 4 HOURS: * IF OFFICE WILL BE OPEN: You need to be seen within the next 3 or 4 hours. Call your doctor (or NP/PA) now or as soon as the office opens. CALL BACK IF: * You become worse CARE ADVICE given per Cough - Acute Non-Productive (Adult) guideline. Comments User: Lennie Hummer, RN Date/Time Lamount Cohen Time): 08/01/2020 9:09:51 AM *1 week late on menstrual cycle, has BC implant, negative home pregnancy test User: Lennie Hummer, RN Date/Time Lamount Cohen Time):  08/01/2020  9:12:28 AM *has had a few puffs of husbands albuterol inhaler and it helps User: Lennie Hummer, RN Date/Time Lamount Cohen Time): 08/01/2020 9:16:59 AM *spoke with Denisha at office appt line- states no available appts in office today- referred caller to [facility] Referrals GO TO FACILITY UNDECIDED

## 2020-08-06 ENCOUNTER — Telehealth: Payer: Self-pay

## 2020-08-06 NOTE — Telephone Encounter (Signed)
I"m fine with seeing her in the office as long as her most recent negative Covid test was within 7 days

## 2020-08-06 NOTE — Telephone Encounter (Signed)
Patient is calling in stating that she has had a cough for about a week, went to a walk in clinic and was told she has bronchitis. Marika stated she has tested negative 3 times for covid and is almost done with her regimen of medications and isnt doing any better. Requesting an appointment in office to have someone listen to her lungs.

## 2020-08-07 NOTE — Telephone Encounter (Signed)
I called patient to schedule her.She stated she was disgusted with the lack of care that she has received from our office and she is disappointed with [place]. She stated that she has found a provider that is "not scared of a virus and will provide her with care". I attempted to explain to her that we must take caution to protect all patients and that the policies we are following are [facility], not just [place]. 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 I apologized that she felt this way and advised her that I would send a message over to [place] to let her know. She thanked me and hung up.

## 2020-08-07 NOTE — Telephone Encounter (Addendum)
Not sure what happened here as I was happy to see her in the office as noted. This is unfortunate as she was a lovely patient, but noted regarding her request for another PCP.  If she found another PCP will you please remove my name?

## 2020-08-12 ENCOUNTER — Telehealth: Payer: Self-pay

## 2020-08-12 NOTE — Telephone Encounter (Signed)
Called patient have moved appointment to [facility]

## 2020-08-12 NOTE — Telephone Encounter (Signed)
Pt called to schedule appt with PCP no availability, is supposed to return to work tomorrow and was wanting to see if she can be fit in as she may need note for work.... Please advise if you can fit pt in.... appt scheduled with Dr Hazle Nordmann at 11:40...   Dr Selena Batten, pt has been having Sx x 2-3 weeks and has tested neg for covid.Marland KitchenMarland KitchenMarland Kitchen

## 2020-08-12 NOTE — Telephone Encounter (Signed)
Ok to see in-person. Unfortunately cannot see sooner than scheduled

## 2020-08-13 ENCOUNTER — Other Ambulatory Visit: Payer: Self-pay

## 2020-08-13 ENCOUNTER — Encounter: Payer: Self-pay | Admitting: Primary Care

## 2020-08-13 ENCOUNTER — Ambulatory Visit: Payer: 59 | Admitting: Primary Care

## 2020-08-13 VITALS — BP 126/84 | HR 110 | Temp 97.4°F | Ht 64.0 in | Wt 232.0 lb

## 2020-08-13 DIAGNOSIS — J069 Acute upper respiratory infection, unspecified: Secondary | ICD-10-CM

## 2020-08-13 MED ORDER — PREDNISONE 20 MG PO TABS: ORAL_TABLET | ORAL | 0 refills | Status: DC

## 2020-08-13 NOTE — Telephone Encounter (Signed)
Noted, will evaluate. 

## 2020-08-13 NOTE — Patient Instructions (Signed)
Start prednisone 20 mg tablets. Take 2 tablets daily for 5 days.  Start omeprazole 20 mg once daily for heartburn, okay to increase to 2 pills (40 mg) if no improvement after 2-3 days.  It was a pleasure to see you today!

## 2020-08-13 NOTE — Assessment & Plan Note (Signed)
Very likely she had Covid-19. It doesn't appear that she's had pneumonia or other bacterial cause, especially given the two antibiotic regimen she's had.   Lungs actually pretty clear today.  Persistent cough on exam.  Treat with prednisone burst of 40 mg daily x 5 days. Treat with omeprazole 20-40 mg daily for reflux induced cough.  Continue PRN promethazine cough suppressant and albuterol inhaler. Stop pulmicort.   She will update in 2 days. Chest xray from [facility] was negative. FMLA will be completed.

## 2020-08-13 NOTE — Progress Notes (Signed)
Subjective:    Patient ID: Loretta Burton, female    DOB: 1986/03/17, 35 y.o.   MRN: [id number]  HPI  Loretta Burton is a very pleasant 35 y.o. female with a history of recurrent sinusitis, anxiety and depression, UTI who presents today with a chief complaint of cough.   Three weeks ago she developed a sneezing, dry cough, thought symptoms were secondary to allergies, tested negative for Covid-19. A few days later she noticed chest tightness. Her children came up with "stomach bug" around the same time. Several days later she found out that her grandmother and mother had Covid-19 so she took another Covid-19 test which was negative.  Symptoms persisted so she presented to [facility] on 08/01/20 for dry cough, headaches, low grade fevers, chest tightness, SOB. Provided with prescriptions for Augmentin for a 7 day course, prednisone taper, promethazine DM cough suppressant, and albuterol inhaler.    She also developed the "stomach bug" one week ago (diarrhea and low grade fever).  This lasted for a few days with resolve. She presented to another Urgent Care last week, was placed on Ivermectin, Azithromycin, nebulizer treatment. She completed her antibiotic two days ago and feels about the same.  Since then she continues to cough persistently, dry cough. She is feeling some better today, but symptoms will wax and wane. She's watching her oxygen saturation levels which this morning was 97%, she did see 94% yesterday. She completed a chest xray yesterday through [facility], has a copy on her phone today.   She is needing FMLA to cover for her missed days form work, her employer is requesting this. Her first day out of work was April 18th and she would like to return May 2nd.   She doesn't seem to notice improvement with the Pulmicort inhaler, did notice an improvement with prednisone temporarily. She has the promethazine cough suppressant at home which helps with rest.    Review of Systems   Constitutional: Negative for chills, fatigue and fever.  HENT: Negative for congestion and sore throat.   Respiratory: Positive for cough, chest tightness and shortness of breath.   Gastrointestinal: Negative for diarrhea.  Neurological: Positive for headaches.         Past Medical History:  Diagnosis Date  . Allergy    seasonal  . Anemia   . Cholelithiasis   . Decreased fetus movements affecting management of mother in third trimester 08/12/2017  . Depression   . Depression affecting pregnancy 12/24/2016  . Insomnia   . Obesity affecting pregnancy 12/24/2016  . Panic attack   . Supervision of high-risk pregnancy 12/24/2016   [facility]inic [facility] Prenatal Labs Dating  Blood type:    Genetic Screen 1 Screen:    AFP:     Quad:     NIPS: Antibody:  Anatomic [place]  Rubella:   Varicella:   GTT Early:               Third trimester:  RPR:    Rhogam  HBsAg:    TDaP vaccine                       Flu Shot: HIV:    Baby Food                                GBS:  Contraception  Pap: CBB    CS/VBAC   Support Person  Social History   Socioeconomic History  . Marital status: Married    Spouse name: Not on file  . Number of children: Not on file  . Years of education: Not on file  . Highest education level: Not on file  Occupational History  . Not on file  Tobacco Use  . Smoking status: Never Smoker  . Smokeless tobacco: Never Used  Substance and Sexual Activity  . Alcohol use: No  . Drug use: No  . Sexual activity: Yes    Partners: Male    Birth control/protection: None  Other Topics Concern  . Not on file  Social History Narrative   Married.   1 daughter.   Works at [employer] as a Lawyer.   Enjoys crafting, spending time with her daughter   Social Determinants of Corporate investment banker Strain: Not on file  Food Insecurity: Not on file  Transportation Needs: Not on file  Physical Activity: Not on file  Stress: Not on file  Social Connections: Not on file   Intimate Partner Violence: Not on file    Past Surgical History:  Procedure Laterality Date  . CHOLECYSTECTOMY N/A 11/28/2014   Procedure: LAPAROSCOPIC CHOLECYSTECTOMY WITH INTRAOPERATIVE CHOLANGIOGRAM;  Surgeon: Lattie Haw, MD;  Location: [facility] ORS;  Service: General;  Laterality: N/A;  . WISDOM TOOTH EXTRACTION     x4    Family History  Problem Relation Age of Onset  . Diabetes Mother   . Hypertension Mother   . Depression Mother   . Diabetes Maternal Grandmother   . Hypertension Maternal Grandmother   . Heart disease Maternal Grandmother   . Cancer Maternal Grandmother        skin  . Diabetes Paternal Grandmother   . Hypertension Paternal Grandmother   . Heart disease Paternal Grandmother   . Diabetes Paternal Grandfather   . Hypertension Paternal Grandfather   . Heart disease Paternal Grandfather     Allergies  Allergen Reactions  . Adhesive [Tape] Itching  . Ciprofloxacin Hives and Nausea And Vomiting    Current Outpatient Medications on File Prior to Visit  Medication Sig Dispense Refill  . escitalopram (LEXAPRO) 20 MG tablet Take 1 tablet (20 mg total) by mouth daily. For anxiety and depression. 90 tablet 3  . etonogestrel (NEXPLANON) 68 MG IMPL implant 1 each by Subdermal route once.     No current facility-administered medications on file prior to visit.    There were no vitals taken for this visit. Objective:   Physical Exam HENT:     Right Ear: Tympanic membrane and ear canal normal.     Left Ear: Tympanic membrane and ear canal normal.     Nose:     Right Sinus: No maxillary sinus tenderness or frontal sinus tenderness.     Left Sinus: No maxillary sinus tenderness or frontal sinus tenderness.  Eyes:     Conjunctiva/sclera: Conjunctivae normal.  Cardiovascular:     Rate and Rhythm: Normal rate and regular rhythm.  Pulmonary:     Effort: Pulmonary effort is normal.     Breath sounds: Normal breath sounds. No wheezing or rales.     Comments:  Persistent dry cough noted during exam Musculoskeletal:     Cervical back: Neck supple.  Lymphadenopathy:     Cervical: No cervical adenopathy.  Skin:    General: Skin is warm and dry.           Assessment & Plan:      This  visit occurred during the SARS-CoV-2 public health emergency.  Safety protocols were in place, including screening questions prior to the visit, additional usage of staff PPE, and extensive cleaning of exam room while observing appropriate contact time as indicated for disinfecting solutions.  

## 2020-08-14 ENCOUNTER — Ambulatory Visit: Payer: 59 | Admitting: Family Medicine

## 2020-08-15 ENCOUNTER — Telehealth: Payer: Self-pay

## 2020-08-15 NOTE — Telephone Encounter (Signed)
Pt called to report she received a call from her job stating that they could not see the fax and she would like to pick up... Placed in the front office for pick up and copy left on Joellen's desk

## 2020-10-10 DIAGNOSIS — J3089 Other allergic rhinitis: Secondary | ICD-10-CM

## 2020-10-10 DIAGNOSIS — R053 Chronic cough: Secondary | ICD-10-CM

## 2020-10-12 MED ORDER — MONTELUKAST SODIUM 10 MG PO TABS: 10.0000 mg | ORAL_TABLET | Freq: Every day | ORAL | 0 refills | Status: DC

## 2021-10-28 DIAGNOSIS — J209 Acute bronchitis, unspecified: Secondary | ICD-10-CM | POA: Diagnosis not present

## 2021-12-25 ENCOUNTER — Ambulatory Visit: Payer: BC Managed Care – PPO | Admitting: Family Medicine

## 2021-12-25 VITALS — BP 130/90 | HR 106 | Temp 97.8°F | Wt 227.2 lb

## 2021-12-25 DIAGNOSIS — R509 Fever, unspecified: Secondary | ICD-10-CM | POA: Diagnosis not present

## 2021-12-25 LAB — POCT RAPID STREP A (OFFICE): Rapid Strep A Screen: NEGATIVE

## 2021-12-25 LAB — POC COVID19 BINAXNOW: SARS Coronavirus 2 Ag: NEGATIVE

## 2021-12-25 LAB — POC INFLUENZA A&B (BINAX/QUICKVUE)
Influenza A, POC: NEGATIVE
Influenza B, POC: NEGATIVE

## 2021-12-25 NOTE — Patient Instructions (Addendum)
Stop decongestant given increase in  BP and pulse.  Rest, fluids.  Start ibuprofen 800 mg three times daily as needed for sore throat pain.  Can use mucinex DM or palin for cough and congestion.  Call if not improving as expected, go to ER if severe shortness of breath.

## 2021-12-25 NOTE — Progress Notes (Signed)
Patient ID: Loretta Burton, female    DOB: [birth year], 36 y.o.   MRN: [id number]  This visit was conducted in person.  Blood pressure (!) 130/90, pulse (!) 106, temperature 97.8 F (36.6 C), temperature source Temporal, weight 227 lb 4 oz (103.1 kg), SpO2 97 %, unknown if currently breastfeeding.    CC:  Chief Complaint  Patient presents with   Sore Throat    X 3 days. Negative covid test 2 days ago   Nasal Congestion   Ear Pain   Cough    Subjective:   HPI: Loretta Burton is a 36 y.o. female patient of Mayra Reel, NP with history of  recurrent sinusitis and obesity presenting on 12/25/2021 for Sore Throat (X 3 days. Negative covid test 2 days ago), Nasal Congestion, Ear Pain, and Cough   Date of onset:12/22/21   She started with  ST, nasal congestion,  sneeze, and dry  mild cough.  Her symptoms progressed to  bilateral ear pain more in left ear, increasing ST  Fever occurred today 99. 69F   No SOB, no wheeze.    Feeling tired, but mild body aches.  She has tried [employer].   She works in Teacher, music. She has been around friends with strep about 2 week ago.  COVID 19 screen COVID testing:negative home test on 9/5 ( day 1-2 of illness) COVID vaccine:  x 1 in 2021 COVID exposure: No recent travel or known exposure to COVID19  The importance of social distancing was discussed today.        Relevant past medical, surgical, family and social history reviewed and updated as indicated. Interim medical history since our last visit reviewed. Allergies and medications reviewed and updated. Outpatient Medications Prior to Visit  Medication Sig Dispense Refill   etonogestrel (NEXPLANON) 68 MG IMPL implant 1 each by Subdermal route once.     budesonide (PULMICORT) 0.5 MG/2ML nebulizer solution Take by nebulization.     montelukast (SINGULAIR) 10 MG tablet Take 1 tablet (10 mg total) by mouth at bedtime. For allergies. 90 tablet 0   predniSONE (DELTASONE) 20 MG  tablet Take 2 tablets by mouth once daily for 5 days. 10 tablet 0   No facility-administered medications prior to visit.     Per HPI unless specifically indicated in ROS section below Review of Systems  Constitutional:  Negative for fatigue and fever.  HENT:  Positive for ear pain, rhinorrhea, sinus pain and sore throat. Negative for congestion.   Eyes:  Negative for pain.  Respiratory:  Negative for cough and shortness of breath.   Cardiovascular:  Negative for chest pain, palpitations and leg swelling.  Gastrointestinal:  Negative for abdominal pain.  Genitourinary:  Negative for dysuria and vaginal bleeding.  Musculoskeletal:  Negative for back pain.  Neurological:  Negative for syncope, light-headedness and headaches.  Psychiatric/Behavioral:  Negative for dysphoric mood.    Objective:  There were no vitals taken for this visit.  Wt Readings from Last 3 Encounters:  08/13/20 232 lb (105.2 kg)  05/17/20 236 lb 12 oz (107.4 kg)  01/26/19 222 lb 8 oz (100.9 kg)      Physical Exam Constitutional:      General: She is not in acute distress.    Appearance: She is well-developed. She is not ill-appearing or toxic-appearing.  HENT:     Head: Normocephalic.     Right Ear: Hearing, tympanic membrane, ear canal and external ear normal. Tympanic membrane is not erythematous,  retracted or bulging.     Left Ear: Hearing, tympanic membrane, ear canal and external ear normal. Tympanic membrane is not erythematous, retracted or bulging.     Nose: Mucosal edema and rhinorrhea present.     Right Sinus: No maxillary sinus tenderness or frontal sinus tenderness.     Left Sinus: No maxillary sinus tenderness or frontal sinus tenderness.     Mouth/Throat:     Pharynx: Uvula midline.  Eyes:     General: Lids are normal. Lids are everted, no foreign bodies appreciated.     Conjunctiva/sclera: Conjunctivae normal.     Pupils: Pupils are equal, round, and reactive to light.  Neck:     Thyroid:  No thyroid mass or thyromegaly.     Vascular: No carotid bruit.     Trachea: Trachea normal.  Cardiovascular:     Rate and Rhythm: Normal rate and regular rhythm.     Pulses: Normal pulses.     Heart sounds: Normal heart sounds, S1 normal and S2 normal. No murmur heard.    No friction rub. No gallop.  Pulmonary:     Effort: Pulmonary effort is normal. No tachypnea or respiratory distress.     Breath sounds: Normal breath sounds. No decreased breath sounds, wheezing, rhonchi or rales.  Musculoskeletal:     Cervical back: Normal range of motion and neck supple.  Skin:    General: Skin is warm and dry.     Findings: No rash.  Neurological:     Mental Status: She is alert.  Psychiatric:        Mood and Affect: Mood is not anxious or depressed.        Speech: Speech normal.        Behavior: Behavior normal. Behavior is cooperative.        Judgment: Judgment normal.       Results for orders placed or performed in visit on 01/26/19  CBC  Result Value Ref Range   WBC 7.4 4.0 - 10.5 K/uL   RBC 4.33 3.87 - 5.11 Mil/uL   Platelets 241.0 150.0 - 400.0 K/uL   Hemoglobin 13.3 12.0 - 15.0 g/dL   HCT 31.5 17.6 - 16.0 %   MCV 90.3 78.0 - 100.0 fl   MCHC 33.9 30.0 - 36.0 g/dL   RDW 73.7 10.6 - 26.9 %  Comprehensive metabolic panel  Result Value Ref Range   Sodium 138 135 - 145 mEq/L   Potassium 3.9 3.5 - 5.1 mEq/L   Chloride 103 96 - 112 mEq/L   CO2 27 19 - 32 mEq/L   Glucose, Bld 84 70 - 99 mg/dL   BUN 9 6 - 23 mg/dL   Creatinine, Ser 4.85 0.40 - 1.20 mg/dL   Total Bilirubin 0.4 0.2 - 1.2 mg/dL   Alkaline Phosphatase 60 39 - 117 U/L   AST 16 0 - 37 U/L   ALT 20 0 - 35 U/L   Total Protein 7.7 6.0 - 8.3 g/dL   Albumin 4.6 3.5 - 5.2 g/dL   Calcium 9.7 8.4 - 46.2 mg/dL   GFR 703.50 >09.38 mL/min  Lipid panel  Result Value Ref Range   Cholesterol 202 (H) 0 - 200 mg/dL   Triglycerides 182.9 (H) 0.0 - 149.0 mg/dL   HDL 93.71 >69.67 mg/dL   VLDL 89.3 (H) 0.0 - 81.0 mg/dL   Total  CHOL/HDL Ratio 5    NonHDL 162.89   LDL cholesterol, direct  Result Value Ref Range   Direct  LDL 136.0 mg/dL     COVID 19 screen:  No recent travel or known exposure to COVID19 The patient denies respiratory symptoms of COVID 19 at this time. The importance of social distancing was discussed today.   Assessment and Plan    Problem List Items Addressed This Visit     Fever - Primary    Acute Negative COVID test, negative flu test and negative strep test in office today Symptoms most likely due to viral URI.  She will continue with supportive and symptomatic care. Given blood pressure elevation she will stop decongestant and use instead use Mucinex DM or plain Mucinex.  She can use ibuprofen 800 mg 3 times daily as needed for sore throat pain.  She will push fluids.  She will call if she is not improving as expected over the next 7 to 10 days.  ER precautions provided.      Relevant Orders   POCT rapid strep A (Completed)   POC Influenza A&B(BINAX/QUICKVUE) (Completed)   POC COVID-19 BinaxNow (Completed)   Orders Placed This Encounter  Procedures   POCT rapid strep A   POC Influenza A&B(BINAX/QUICKVUE)   POC COVID-19 BinaxNow    Order Specific Question:   Previously tested for COVID-19    Answer:   Yes    Order Specific Question:   Resident in a congregate (group) care setting    Answer:   No    Order Specific Question:   Employed in healthcare setting    Answer:   Yes    Order Specific Question:   Pregnant    Answer:   No     Kerby Nora, MD

## 2021-12-25 NOTE — Assessment & Plan Note (Signed)
Acute Negative COVID test, negative flu test and negative strep test in office today Symptoms most likely due to viral URI.  She will continue with supportive and symptomatic care. Given blood pressure elevation she will stop decongestant and use instead use Mucinex DM or plain Mucinex.  She can use ibuprofen 800 mg 3 times daily as needed for sore throat pain.  She will push fluids.  She will call if she is not improving as expected over the next 7 to 10 days.  ER precautions provided.

## 2021-12-28 DIAGNOSIS — H6501 Acute serous otitis media, right ear: Secondary | ICD-10-CM | POA: Diagnosis not present

## 2021-12-28 DIAGNOSIS — H6692 Otitis media, unspecified, left ear: Secondary | ICD-10-CM | POA: Diagnosis not present

## 2022-01-02 ENCOUNTER — Ambulatory Visit: Payer: BC Managed Care – PPO | Admitting: Internal Medicine

## 2022-01-02 ENCOUNTER — Encounter: Payer: Self-pay | Admitting: Internal Medicine

## 2022-01-02 VITALS — BP 134/88 | HR 74 | Temp 98.1°F | Ht 64.0 in | Wt 229.0 lb

## 2022-01-02 DIAGNOSIS — H6692 Otitis media, unspecified, left ear: Secondary | ICD-10-CM

## 2022-01-02 DIAGNOSIS — H9202 Otalgia, left ear: Secondary | ICD-10-CM

## 2022-01-02 DIAGNOSIS — H8102 Meniere's disease, left ear: Secondary | ICD-10-CM

## 2022-01-02 MED ORDER — CIPROFLOXACIN-DEXAMETHASONE 0.3-0.1 % OT SUSP: 4.0000 [drp] | Freq: Two times a day (BID) | OTIC | 0 refills | Status: DC

## 2022-01-02 MED ORDER — METHYLPREDNISOLONE 4 MG PO TBPK: ORAL_TABLET | ORAL | 0 refills | Status: DC

## 2022-01-02 MED ORDER — AZITHROMYCIN 250 MG PO TABS: ORAL_TABLET | ORAL | 0 refills | Status: AC

## 2022-01-02 NOTE — Progress Notes (Signed)
Acute Office Visit  Subjective:     Patient ID: Loretta Burton, female    DOB: 09/12/1985, 36 y.o.   MRN: [id number]  Chief Complaint  Patient presents with   Ear Pain    Pt went to urgent care on Sunday was dx with ear infection and px augmentin still on it but pt still having pain and feels a burning sensation on the inside with her hearing decreasing in the left ear.    F/u with 4 y.o son   1. Left ear pain and sore throat 12/23/21 with nasal congestion worse 12/25/21 went to [facility] and given Augmentin on day 6/10 but having intense left ear pain and hearing loss worse in 1 week tried hydrogen peroxide, humidifer, essentials oils not helping   Patient is in today for left ear pain and hearing loss  Review of Systems  Constitutional:  Negative for weight loss.  HENT:  Positive for ear pain and hearing loss.   Eyes:  Negative for blurred vision.  Respiratory:  Negative for shortness of breath.   Cardiovascular:  Negative for chest pain.  Gastrointestinal:  Negative for abdominal pain and blood in stool.  Genitourinary:  Negative for dysuria.  Musculoskeletal:  Negative for falls and joint pain.  Skin:  Negative for rash.  Neurological:  Negative for headaches.  Psychiatric/Behavioral:  Negative for depression.         Objective:    BP 134/88 (BP Location: Left Arm, Patient Position: Sitting, Cuff Size: Normal)   Pulse 74   Temp 98.1 F (36.7 C) (Oral)   Ht 5\' 4"  (1.626 m)   Wt 229 lb (103.9 kg)   SpO2 99%   BMI 39.31 kg/m    Physical Exam Vitals and nursing note reviewed.  Constitutional:      Appearance: Normal appearance. She is well-developed and well-groomed.  HENT:     Head: Normocephalic and atraumatic.  Eyes:     Conjunctiva/sclera: Conjunctivae normal.     Pupils: Pupils are equal, round, and reactive to light.  Cardiovascular:     Rate and Rhythm: Normal rate and regular rhythm.     Heart sounds: Normal heart sounds. No murmur heard. Pulmonary:      Effort: Pulmonary effort is normal.     Breath sounds: Normal breath sounds.  Abdominal:     General: Abdomen is flat. Bowel sounds are normal.     Tenderness: There is no abdominal tenderness.  Musculoskeletal:        General: No tenderness.  Skin:    General: Skin is warm and dry.  Neurological:     General: No focal deficit present.     Mental Status: She is alert and oriented to person, place, and time. Mental status is at baseline.     Cranial Nerves: Cranial nerves 2-12 are intact.     Motor: Motor function is intact.     Coordination: Coordination is intact.     Gait: Gait is intact.  Psychiatric:        Attention and Perception: Attention and perception normal.        Mood and Affect: Mood and affect normal.        Speech: Speech normal.        Behavior: Behavior normal. Behavior is cooperative.        Thought Content: Thought content normal.        Cognition and Memory: Cognition and memory normal.        Judgment: Judgment  normal.     No results found for any visits on 01/02/22.      Assessment & Plan:   Problem List Items Addressed This Visit   None Visit Diagnoses     Left ear pain    -  Primary   Relevant Medications   azithromycin (ZITHROMAX) 250 MG tablet   methylPREDNISolone (MEDROL DOSEPAK) 4 MG TBPK tablet   Left otitis media, unspecified otitis media type       Relevant Medications   amoxicillin-clavulanate (AUGMENTIN) 875-125 MG tablet   azithromycin (ZITHROMAX) 250 MG tablet   methylPREDNISolone (MEDROL DOSEPAK) 4 MG TBPK tablet   ciprofloxacin-dexamethasone (CIPRODEX) OTIC suspension   Meniere disease, left       Relevant Medications   ciprofloxacin-dexamethasone (CIPRODEX) OTIC suspension   Other Relevant Orders   Ambulatory referral to ENT       Meds ordered this encounter  Medications   azithromycin (ZITHROMAX) 250 MG tablet    Sig: With food Take 2 tablets on day 1, then 1 tablet daily on days 2 through 5    Dispense:  6 tablet     Refill:  0   methylPREDNISolone (MEDROL DOSEPAK) 4 MG TBPK tablet    Sig: Use as directed with food in am    Dispense:  21 tablet    Refill:  0   ciprofloxacin-dexamethasone (CIPRODEX) OTIC suspension    Sig: Place 4 drops into the left ear 2 (two) times daily. X4-7 days    Dispense:  7.5 mL    Refill:  0    No follow-ups on file.  Pasty Spillers McLean-Scocuzza, MD

## 2022-01-02 NOTE — Patient Instructions (Addendum)
[facility]  Phone Fax E-mail Address  [phone] Not available [address]   [address]   [place] [address]     Specialties     Otolaryngology             Mnire's Disease  Mnire's disease is an inner ear disorder. It causes recurrent attacks of a spinning sensation (vertigo), and ringing in the ear (tinnitus). It also causes hearing loss and a feeling of fullness or pressure in the ear. It typically affects one ear but can affect both. This is a lifelong condition, and it may get worse over time. There are treatment options to help manage the symptoms of Mnire's disease. What are the causes? This condition is caused by having too much of the fluid that is in the inner ear (endolymph). When fluid builds up in the inner ear, it affects the nerves that control balance and hearing. The reason for the fluid buildup is not known. Possible causes include: Allergies. An abnormal reaction of the body's defense system (autoimmune disease). Viral infection of the inner ear. Head injury. What increases the risk? The following factors may make you more likely to develop this condition: Being between 58 and 11 years old. Having a family history of Mnire's disease. Having a history of autoimmune disease. Having an injury to the head (head trauma). What are the signs or symptoms? Symptoms of this condition include: Fullness and pressure in your ear. Roaring or ringing in your ear (tinnitus). Vertigo and loss of balance. Decreased hearing. Nausea and vomiting. This is rare. Symptoms of this condition can come and go and may last for up to 4 or more hours at a time. Symptoms usually start in one ear. They may become more frequent and eventually involve both ears. How is this diagnosed? This condition is diagnosed based on a physical exam and tests. Tests may include: A hearing test (audiogram). An electronystagmogram (ENG). This tests your balance nerve  (vestibular nerve). Imaging studies of your inner ear and hearing nerve, such as an MRI. Other balance tests, such as rotational or balance platform tests. How is this treated? There is no cure for this condition, but treatment can help to manage your symptoms. Treatment may include: A low-salt (low-sodium) diet. This may help to reduce fluid in the body and relieve symptoms. Oral or injected medicines to reduce or control: Vertigo. Nausea. Fluid retention. Use of an air pressure pulse generator. This is a machine that sends small pressure pulses into your ear canal. Injections through the ear drum (tympanic membrane) to control vertigo symptoms. Hearing aids. Inner ear surgery. This is rare. Follow these instructions at home: Eating and drinking Avoid caffeine. Do not drink alcohol. Drink enough fluid to keep your urine pale yellow. Limit the sodium in your diet as told by your health care provider. This is usually no more than 1,500-2,000 mg per day. Check ingredients and nutrition facts on packaged foods and beverages. General instructions Take over-the-counter and prescription medicines only as told by your health care provider. Do not drive if you have vertigo or dizziness. Do not use any products that contain nicotine or tobacco. These products include cigarettes, chewing tobacco, and vaping devices, such as e-cigarettes. If you need help quitting, ask your health care provider. Keep all follow-up visits. This is important. Where to find more information To find more information, advice, and guidance, please see these online sites: American Academy of Otolaryngology-Head and Neck Surgery Foundation: www.enthealth.[employer]  on Deafness and Other Communication Disorders: [employer].com.pt American Hearing Research Foundation: www.american-hearing.org Contact a health care provider if: You have symptoms that last longer than 4 hours. You have new or worse  symptoms. Get help right away if: You have been vomiting for 24 hours. You cannot keep fluids down. You have chest pain or trouble breathing. These symptoms may represent a serious problem that is an emergency. Do not wait to see if the symptoms will go away. Get medical help right away. Call your local emergency services (911 in the [place] Do not drive yourself to the hospital. Summary Mnire's disease is an inner ear disorder. This condition causes recurrent attacks of a spinning sensation (vertigo), ringing in the ear (tinnitus), hearing loss, and ear fullness. Symptoms of this condition can come and go and may last for up to 4 or more hours at a time. Mnire's disease can be treated with a low-sodium diet, medicines, and sometimes, surgery. This information is not intended to replace advice given to you by your health care provider. Make sure you discuss any questions you have with your health care provider. Document Revised: 03/11/2020 Document Reviewed: 03/11/2020 Elsevier Patient Education  2023 [employer].

## 2022-01-05 ENCOUNTER — Telehealth: Payer: Self-pay

## 2022-01-05 ENCOUNTER — Encounter: Payer: Self-pay | Admitting: Otolaryngology

## 2022-01-05 DIAGNOSIS — H6502 Acute serous otitis media, left ear: Secondary | ICD-10-CM | POA: Diagnosis not present

## 2022-01-05 DIAGNOSIS — H93292 Other abnormal auditory perceptions, left ear: Secondary | ICD-10-CM | POA: Diagnosis not present

## 2022-01-05 NOTE — Telephone Encounter (Signed)
Patient states she saw Dr. Olivia Mackie McLean-Scocuzza on 01/02/2022.  Patient states Dr. Terese Door was going to submit an emergency referral to [facility] for her ear.  Patient states [facility] has not received the referral yet.  Patient states she still cannot hear out of her left ear.  Patient states [facility]'s fax number is [phone].

## 2022-01-20 ENCOUNTER — Telehealth: Payer: BC Managed Care – PPO | Admitting: Family Medicine

## 2022-01-20 DIAGNOSIS — U071 COVID-19: Secondary | ICD-10-CM | POA: Diagnosis not present

## 2022-01-20 MED ORDER — BENZONATATE 100 MG PO CAPS: 100.0000 mg | ORAL_CAPSULE | Freq: Two times a day (BID) | ORAL | 0 refills | Status: DC | PRN

## 2022-01-20 MED ORDER — MOLNUPIRAVIR EUA 200MG CAPSULE: 4.0000 | ORAL_CAPSULE | Freq: Two times a day (BID) | ORAL | 0 refills | Status: AC

## 2022-01-20 MED ORDER — ALBUTEROL SULFATE HFA 108 (90 BASE) MCG/ACT IN AERS: 2.0000 | INHALATION_SPRAY | Freq: Four times a day (QID) | RESPIRATORY_TRACT | 0 refills | Status: DC | PRN

## 2022-01-20 MED ORDER — PROMETHAZINE-DM 6.25-15 MG/5ML PO SYRP: 5.0000 mL | ORAL_SOLUTION | Freq: Four times a day (QID) | ORAL | 0 refills | Status: DC | PRN

## 2022-01-20 NOTE — Patient Instructions (Signed)
Jordan Likes, thank you for joining Freddy Finner, NP for today's virtual visit.  While this provider is not your primary care provider (PCP), if your PCP is located in our provider database this encounter information will be shared with them immediately following your visit.  Consent: (Patient) Pleas Loretta Burton provided verbal consent for this virtual visit at the beginning of the encounter.  Current Medications:  Current Outpatient Medications:    albuterol (VENTOLIN HFA) 108 (90 Base) MCG/ACT inhaler, Inhale 2 puffs into the lungs every 6 (six) hours as needed for wheezing or shortness of breath., Disp: 8 g, Rfl: 0   benzonatate (TESSALON) 100 MG capsule, Take 1 capsule (100 mg total) by mouth 2 (two) times daily as needed for cough., Disp: 20 capsule, Rfl: 0   molnupiravir EUA (LAGEVRIO) 200 mg CAPS capsule, Take 4 capsules (800 mg total) by mouth 2 (two) times daily for 5 days., Disp: 40 capsule, Rfl: 0   promethazine-dextromethorphan (PROMETHAZINE-DM) 6.25-15 MG/5ML syrup, Take 5 mLs by mouth 4 (four) times daily as needed for cough., Disp: 118 mL, Rfl: 0   amoxicillin-clavulanate (AUGMENTIN) 875-125 MG tablet, Take 1 tablet by mouth in the morning and at bedtime., Disp: , Rfl:    ciprofloxacin-dexamethasone (CIPRODEX) OTIC suspension, Place 4 drops into the left ear 2 (two) times daily. X4-7 days, Disp: 7.5 mL, Rfl: 0   etonogestrel (NEXPLANON) 68 MG IMPL implant, 1 each by Subdermal route once., Disp: , Rfl:    methylPREDNISolone (MEDROL DOSEPAK) 4 MG TBPK tablet, Use as directed with food in am, Disp: 21 tablet, Rfl: 0   Medications ordered in this encounter:  Meds ordered this encounter  Medications   albuterol (VENTOLIN HFA) 108 (90 Base) MCG/ACT inhaler    Sig: Inhale 2 puffs into the lungs every 6 (six) hours as needed for wheezing or shortness of breath.    Dispense:  8 g    Refill:  0    Order Specific Question:   Supervising Provider    Answer:   Merrilee Jansky  [[id number]]   benzonatate (TESSALON) 100 MG capsule    Sig: Take 1 capsule (100 mg total) by mouth 2 (two) times daily as needed for cough.    Dispense:  20 capsule    Refill:  0    Order Specific Question:   Supervising Provider    Answer:   Merrilee Jansky [id number]   promethazine-dextromethorphan (PROMETHAZINE-DM) 6.25-15 MG/5ML syrup    Sig: Take 5 mLs by mouth 4 (four) times daily as needed for cough.    Dispense:  118 mL    Refill:  0    Order Specific Question:   Supervising Provider    Answer:   Merrilee Jansky [[id number]]   molnupiravir EUA (LAGEVRIO) 200 mg CAPS capsule    Sig: Take 4 capsules (800 mg total) by mouth 2 (two) times daily for 5 days.    Dispense:  40 capsule    Refill:  0    Order Specific Question:   Supervising Provider    Answer:   Merrilee Jansky [id number]     *If you need refills on other medications prior to your next appointment, please contact your pharmacy*  Follow-Up: Call back or seek an in-person evaluation if the symptoms worsen or if the condition fails to improve as anticipated.  [facility] [phone]  Other Instructions Please keep well-hydrated and get plenty of rest. Start a saline nasal rinse to flush out  your nasal passages. You can use plain Mucinex to help thin congestion. If you have a humidifier, running in the bedroom at night. I want you to start OTC vitamin D3 1000 units daily, vitamin C 1000 mg daily, and a zinc supplement. Please take prescribed medications as directed.  You have been enrolled in a MyChart symptom monitoring program. Please answer these questions daily so we can keep track of how you are doing.  You were to quarantine for 5 days from onset of your symptoms.  After day 5, if you have had no fever and you are feeling better, you can end quarantine but need to mask for an additional 5 days. After day 5 if you have a fever or are having significant symptoms, please quarantine for full 10  days.  If you note any worsening of symptoms, any significant shortness of breath or any chest pain, please seek ER evaluation ASAP.  Please do not delay care!  COVID-19: What to Do if You Are Sick If you test positive and are an older adult or someone who is at high risk of getting very sick from COVID-19, treatment may be available. Contact a healthcare provider right away after a positive test to determine if you are eligible, even if your symptoms are mild right now. You can also visit a Test to Treat location and, if eligible, receive a prescription from a provider. Don't delay: Treatment must be started within the first few days to be effective. If you have a fever, cough, or other symptoms, you might have COVID-19. Most people have mild illness and are able to recover at home. If you are sick: Keep track of your symptoms. If you have an emergency warning sign (including trouble breathing), call 911. Steps to help prevent the spread of COVID-19 if you are sick If you are sick with COVID-19 or think you might have COVID-19, follow the steps below to care for yourself and to help protect other people in your home and community. Stay home except to get medical care Stay home. Most people with COVID-19 have mild illness and can recover at home without medical care. Do not leave your home, except to get medical care. Do not visit public areas and do not go to places where you are unable to wear a mask. Take care of yourself. Get rest and stay hydrated. Take over-the-counter medicines, such as acetaminophen, to help you feel better. Stay in touch with your doctor. Call before you get medical care. Be sure to get care if you have trouble breathing, or have any other emergency warning signs, or if you think it is an emergency. Avoid public transportation, ride-sharing, or taxis if possible. Get tested If you have symptoms of COVID-19, get tested. While waiting for test results, stay away from others,  including staying apart from those living in your household. Get tested as soon as possible after your symptoms start. Treatments may be available for people with COVID-19 who are at risk for becoming very sick. Don't delay: Treatment must be started early to be effective--some treatments must begin within 5 days of your first symptoms. Contact your healthcare provider right away if your test result is positive to determine if you are eligible. Self-tests are one of several options for testing for the virus that causes COVID-19 and may be more convenient than laboratory-based tests and point-of-care tests. Ask your healthcare provider or your local health department if you need help interpreting your test results. You can visit  your state, tribal, local, and territorial health department's website to look for the latest local information on testing sites. Separate yourself from other people As much as possible, stay in a specific room and away from other people and pets in your home. If possible, you should use a separate bathroom. If you need to be around other people or animals in or outside of the home, wear a well-fitting mask. Tell your close contacts that they may have been exposed to COVID-19. An infected person can spread COVID-19 starting 48 hours (or 2 days) before the person has any symptoms or tests positive. By letting your close contacts know they may have been exposed to COVID-19, you are helping to protect everyone. See COVID-19 and Animals if you have questions about pets. If you are diagnosed with COVID-19, someone from the health department may call you. Answer the call to slow the spread. Monitor your symptoms Symptoms of COVID-19 include fever, cough, or other symptoms. Follow care instructions from your healthcare provider and local health department. Your local health authorities may give instructions on checking your symptoms and reporting information. When to seek emergency  medical attention Look for emergency warning signs* for COVID-19. If someone is showing any of these signs, seek emergency medical care immediately: Trouble breathing Persistent pain or pressure in the chest New confusion Inability to wake or stay awake Pale, gray, or blue-colored skin, lips, or nail beds, depending on skin tone *This list is not all possible symptoms. Please call your medical provider for any other symptoms that are severe or concerning to you. Call 911 or call ahead to your local emergency facility: Notify the operator that you are seeking care for someone who has or may have COVID-19. Call ahead before visiting your doctor Call ahead. Many medical visits for routine care are being postponed or done by phone or telemedicine. If you have a medical appointment that cannot be postponed, call your doctor's office, and tell them you have or may have COVID-19. This will help the office protect themselves and other patients. If you are sick, wear a well-fitting mask You should wear a mask if you must be around other people or animals, including pets (even at home). Wear a mask with the best fit, protection, and comfort for you. You don't need to wear the mask if you are alone. If you can't put on a mask (because of trouble breathing, for example), cover your coughs and sneezes in some other way. Try to stay at least 6 feet away from other people. This will help protect the people around you. Masks should not be placed on young children under age 55 years, anyone who has trouble breathing, or anyone who is not able to remove the mask without help. Cover your coughs and sneezes Cover your mouth and nose with a tissue when you cough or sneeze. Throw away used tissues in a lined trash can. Immediately wash your hands with soap and water for at least 20 seconds. If soap and water are not available, clean your hands with an alcohol-based hand sanitizer that contains at least 60%  alcohol. Clean your hands often Wash your hands often with soap and water for at least 20 seconds. This is especially important after blowing your nose, coughing, or sneezing; going to the bathroom; and before eating or preparing food. Use hand sanitizer if soap and water are not available. Use an alcohol-based hand sanitizer with at least 60% alcohol, covering all surfaces of your hands and  rubbing them together until they feel dry. Soap and water are the best option, especially if hands are visibly dirty. Avoid touching your eyes, nose, and mouth with unwashed hands. Handwashing Tips Avoid sharing personal household items Do not share dishes, drinking glasses, cups, eating utensils, towels, or bedding with other people in your home. Wash these items thoroughly after using them with soap and water or put in the dishwasher. Clean surfaces in your home regularly Clean and disinfect high-touch surfaces (for example, doorknobs, tables, handles, light switches, and countertops) in your "sick room" and bathroom. In shared spaces, you should clean and disinfect surfaces and items after each use by the person who is ill. If you are sick and cannot clean, a caregiver or other person should only clean and disinfect the area around you (such as your bedroom and bathroom) on an as needed basis. Your caregiver/other person should wait as long as possible (at least several hours) and wear a mask before entering, cleaning, and disinfecting shared spaces that you use. Clean and disinfect areas that may have blood, stool, or body fluids on them. Use household cleaners and disinfectants. Clean visible dirty surfaces with household cleaners containing soap or detergent. Then, use a household disinfectant. Use a product from [employer] List N: Disinfectants for Coronavirus (VWUJW-11). Be sure to follow the instructions on the label to ensure safe and effective use of the product. Many products recommend keeping the surface  wet with a disinfectant for a certain period of time (look at "contact time" on the product label). You may also need to wear personal protective equipment, such as gloves, depending on the directions on the product label. Immediately after disinfecting, wash your hands with soap and water for 20 seconds. For completed guidance on cleaning and disinfecting your home, visit Complete Disinfection Guidance. Take steps to improve ventilation at home Improve ventilation (air flow) at home to help prevent from spreading COVID-19 to other people in your household. Clear out COVID-19 virus particles in the air by opening windows, using air filters, and turning on fans in your home. Use this interactive tool to learn how to improve air flow in your home. When you can be around others after being sick with COVID-19 Deciding when you can be around others is different for different situations. Find out when you can safely end home isolation. For any additional questions about your care, contact your healthcare provider or state or local health department. 07/09/2020 Content source: [facility] for Immunization and Respiratory Diseases ([facility]), Division of Viral Diseases This information is not intended to replace advice given to you by your health care provider. Make sure you discuss any questions you have with your health care provider. Document Revised: 08/22/2020 Document Reviewed: 08/22/2020 Elsevier Patient Education  2022 [employer].      If you have been instructed to have an in-person evaluation today at a local Urgent Care facility, please use the link below. It will take you to a list of all of our available [facility], including address, phone number and hours of operation. Please do not delay care.  [facility]  If you or a family member do not have a primary care provider, use the link below to schedule a visit and establish care. When you choose a [facility] primary care physician or advanced practice provider, you gain a long-term partner in health. Find a Primary Care Provider  Learn more about [facility] in-office and virtual care options:  [facility]

## 2022-01-20 NOTE — Progress Notes (Signed)
Virtual Visit Consent   UNIQUA Burton, you are scheduled for a virtual visit with a [facility] provider today. Just as with appointments in the office, your consent must be obtained to participate. Your consent will be active for this visit and any virtual visit you may have with one of our providers in the next 365 days. If you have a MyChart account, a copy of this consent can be sent to you electronically.  As this is a virtual visit, video technology does not allow for your provider to perform a traditional examination. This may limit your provider's ability to fully assess your condition. If your provider identifies any concerns that need to be evaluated in person or the need to arrange testing (such as labs, EKG, etc.), we will make arrangements to do so. Although advances in technology are sophisticated, we cannot ensure that it will always work on either your end or our end. If the connection with a video visit is poor, the visit may have to be switched to a telephone visit. With either a video or telephone visit, we are not always able to ensure that we have a secure connection.  By engaging in this virtual visit, you consent to the provision of healthcare and authorize for your insurance to be billed (if applicable) for the services provided during this visit. Depending on your insurance coverage, you may receive a charge related to this service.  I need to obtain your verbal consent now. Are you willing to proceed with your visit today? Leanor Voris Strong has provided verbal consent on 01/20/2022 for a virtual visit (video or telephone). Freddy Finner, NP  Date: 01/20/2022 2:50 PM  Virtual Visit via Video Note   I, Freddy Finner, connected with  Loretta Burton  ([id number], 01-03-1986) on 01/20/22 at  2:45 PM EDT by a video-enabled telemedicine application and verified that I am speaking with the correct person using two identifiers.  Location: Patient: Virtual Visit Location  Patient: Home Provider: Virtual Visit Location Provider: Home Office   I discussed the limitations of evaluation and management by telemedicine and the availability of in person appointments. The patient expressed understanding and agreed to proceed.    History of Present Illness: Loretta Burton is a 36 y.o. who identifies as a female who was assigned female at birth, and is being seen today for COVID + on Home test. Report she gets bronchitis easily when she gets a URI. This is her 5th time getting COVID. Symptoms started yesterday- headache, developed sneezing and congestion, throat irritation. Then felt like a "bus hit me"and she developed runny nose and coughing, left ear pain. Fever was 100.5. Today she developed worsening cough and body aches.  Denies chest pain or shortness of breath  Did get first series- never got booster. Has not gotten her Flu vaccine yet Works for hospice    Problems:  Patient Active Problem List   Diagnosis Date Noted   Fever 12/25/2021   Preventative health care 01/26/2019   UTI (urinary tract infection) due to Enterococcus 01/04/2017   BMI 37.0-37.9, adult 12/24/2016   Viral URI with cough 02/04/2016   Recurrent sinusitis 11/21/2015   Insomnia 11/21/2015   Anxiety and depression 01/17/2014    Allergies:  Allergies  Allergen Reactions   Adhesive [Tape] Itching   Ciprofloxacin Hives and Nausea And Vomiting   Medications:  Current Outpatient Medications:    albuterol (VENTOLIN HFA) 108 (90 Base) MCG/ACT inhaler, Inhale 2 puffs into the  lungs every 6 (six) hours as needed for wheezing or shortness of breath., Disp: 8 g, Rfl: 0   benzonatate (TESSALON) 100 MG capsule, Take 1 capsule (100 mg total) by mouth 2 (two) times daily as needed for cough., Disp: 20 capsule, Rfl: 0   molnupiravir EUA (LAGEVRIO) 200 mg CAPS capsule, Take 4 capsules (800 mg total) by mouth 2 (two) times daily for 5 days., Disp: 40 capsule, Rfl: 0    promethazine-dextromethorphan (PROMETHAZINE-DM) 6.25-15 MG/5ML syrup, Take 5 mLs by mouth 4 (four) times daily as needed for cough., Disp: 118 mL, Rfl: 0   amoxicillin-clavulanate (AUGMENTIN) 875-125 MG tablet, Take 1 tablet by mouth in the morning and at bedtime., Disp: , Rfl:    ciprofloxacin-dexamethasone (CIPRODEX) OTIC suspension, Place 4 drops into the left ear 2 (two) times daily. X4-7 days, Disp: 7.5 mL, Rfl: 0   etonogestrel (NEXPLANON) 68 MG IMPL implant, 1 each by Subdermal route once., Disp: , Rfl:    methylPREDNISolone (MEDROL DOSEPAK) 4 MG TBPK tablet, Use as directed with food in am, Disp: 21 tablet, Rfl: 0  Observations/Objective: Patient is well-developed, well-nourished in no acute distress.  Resting comfortably  at home.  Head is normocephalic, atraumatic.  No labored breathing.  Speech is clear and coherent with logical content.  Patient is alert and oriented at baseline.  Cough- wet sound Congestion and nasal tone  Assessment and Plan: 1. COVID-19  - albuterol (VENTOLIN HFA) 108 (90 Base) MCG/ACT inhaler; Inhale 2 puffs into the lungs every 6 (six) hours as needed for wheezing or shortness of breath.  Dispense: 8 g; Refill: 0 - benzonatate (TESSALON) 100 MG capsule; Take 1 capsule (100 mg total) by mouth 2 (two) times daily as needed for cough.  Dispense: 20 capsule; Refill: 0 - promethazine-dextromethorphan (PROMETHAZINE-DM) 6.25-15 MG/5ML syrup; Take 5 mLs by mouth 4 (four) times daily as needed for cough.  Dispense: 118 mL; Refill: 0 - molnupiravir EUA (LAGEVRIO) 200 mg CAPS capsule; Take 4 capsules (800 mg total) by mouth 2 (two) times daily for 5 days.  Dispense: 40 capsule; Refill: 0  -rest -hydrate -take above as ordered, no pred given as she has had two courses of pred in the last month, in addition to zpack, and Augmentin or ear infection -Molnupiravir provided due to no recent GFR in last 3 years.  -OTC reviewed and on AVS -in person precautions  reviewed   Reviewed side effects, risks and benefits of medication.    Patient acknowledged agreement and understanding of the plan.   Past Medical, Surgical, Social History, Allergies, and Medications have been Reviewed.    Follow Up Instructions: I discussed the assessment and treatment plan with the patient. The patient was provided an opportunity to ask questions and all were answered. The patient agreed with the plan and demonstrated an understanding of the instructions.  A copy of instructions were sent to the patient via MyChart unless otherwise noted below.   The patient was advised to call back or seek an in-person evaluation if the symptoms worsen or if the condition fails to improve as anticipated.  Time:  I spent 15 minutes with the patient via telehealth technology discussing the above problems/concerns.    Perlie Mayo, NP

## 2022-04-14 DIAGNOSIS — J069 Acute upper respiratory infection, unspecified: Secondary | ICD-10-CM | POA: Diagnosis not present

## 2022-04-14 DIAGNOSIS — R059 Cough, unspecified: Secondary | ICD-10-CM | POA: Diagnosis not present

## 2022-04-17 ENCOUNTER — Telehealth: Payer: BC Managed Care – PPO | Admitting: Family Medicine

## 2022-04-17 DIAGNOSIS — J4541 Moderate persistent asthma with (acute) exacerbation: Secondary | ICD-10-CM

## 2022-04-17 MED ORDER — PREDNISONE 20 MG PO TABS: 20.0000 mg | ORAL_TABLET | Freq: Two times a day (BID) | ORAL | 0 refills | Status: AC

## 2022-04-17 MED ORDER — AZITHROMYCIN 250 MG PO TABS: ORAL_TABLET | ORAL | 0 refills | Status: AC

## 2022-04-17 NOTE — Progress Notes (Signed)
Virtual Visit Consent   Loretta Burton, you are scheduled for a virtual visit with a [name] provider today. Just as with appointments in the office, your consent must be obtained to participate. Your consent will be active for this visit and any virtual visit you may have with one of our providers in the next 365 days. If you have a MyChart account, a copy of this consent can be sent to you electronically.  As this is a virtual visit, video technology does not allow for your provider to perform a traditional examination. This may limit your provider's ability to fully assess your condition. If your provider identifies any concerns that need to be evaluated in person or the need to arrange testing (such as labs, EKG, etc.), we will make arrangements to do so. Although advances in technology are sophisticated, we cannot ensure that it will always work on either your end or our end. If the connection with a video visit is poor, the visit may have to be switched to a telephone visit. With either a video or telephone visit, we are not always able to ensure that we have a secure connection.  By engaging in this virtual visit, you consent to the provision of healthcare and authorize for your insurance to be billed (if applicable) for the services provided during this visit. Depending on your insurance coverage, you may receive a charge related to this service.  I need to obtain your verbal consent now. Are you willing to proceed with your visit today? [name] has provided verbal consent on 04/17/2022 for a virtual visit (video or telephone). Georgana Curio, FNP  Date: 04/17/2022 4:14 PM  Virtual Visit via Video Note   I, Georgana Curio, connected with  SANJNA HASKEW  ([id number], January 04, 1986) on 04/17/22 at  4:15 PM EST by a video-enabled telemedicine application and verified that I am speaking with the correct person using two identifiers.  Location: Patient: Virtual Visit Location  Patient: Home Provider: Virtual Visit Location Provider: Home Office   I discussed the limitations of evaluation and management by telemedicine and the availability of in person appointments. The patient expressed understanding and agreed to proceed.    History of Present Illness: Loretta Burton is a 36 y.o. who identifies as a female who was assigned female at birth, and is being seen today for cough- prod with green mucus, wheezing and sob. . Fever and chills continue. Sx for a week now. Seen at urgent care tues with neg flu test. No covid test done however in home covid tests x3 have been neg. She has a history of asthmatic bronchitis and has albuterol to use. She works for hospice. [name]  HPI: HPI  Problems:  Patient Active Problem List   Diagnosis Date Noted   Fever 12/25/2021   Preventative health care 01/26/2019   UTI (urinary tract infection) due to Enterococcus 01/04/2017   BMI 37.0-37.9, adult 12/24/2016   Viral URI with cough 02/04/2016   Recurrent sinusitis 11/21/2015   Insomnia 11/21/2015   Anxiety and depression 01/17/2014    Allergies:  Allergies  Allergen Reactions   Adhesive [Tape] Itching   Ciprofloxacin Hives and Nausea And Vomiting   Medications:  Current Outpatient Medications:    azithromycin (ZITHROMAX) 250 MG tablet, Take 2 tablets on day 1, then 1 tablet daily on days 2 through 5, Disp: 6 tablet, Rfl: 0   predniSONE (DELTASONE) 20 MG tablet, Take 1 tablet (20 mg total) by mouth 2 (two)  times daily with a meal for 5 days., Disp: 10 tablet, Rfl: 0   albuterol (VENTOLIN HFA) 108 (90 Base) MCG/ACT inhaler, Inhale 2 puffs into the lungs every 6 (six) hours as needed for wheezing or shortness of breath., Disp: 8 g, Rfl: 0   amoxicillin-clavulanate (AUGMENTIN) 875-125 MG tablet, Take 1 tablet by mouth in the morning and at bedtime., Disp: , Rfl:    benzonatate (TESSALON) 100 MG capsule, Take 1 capsule (100 mg total) by mouth 2 (two) times daily as needed for cough.,  Disp: 20 capsule, Rfl: 0   ciprofloxacin-dexamethasone (CIPRODEX) OTIC suspension, Place 4 drops into the left ear 2 (two) times daily. X4-7 days, Disp: 7.5 mL, Rfl: 0   etonogestrel (NEXPLANON) 68 MG IMPL implant, 1 each by Subdermal route once., Disp: , Rfl:    methylPREDNISolone (MEDROL DOSEPAK) 4 MG TBPK tablet, Use as directed with food in am, Disp: 21 tablet, Rfl: 0   promethazine-dextromethorphan (PROMETHAZINE-DM) 6.25-15 MG/5ML syrup, Take 5 mLs by mouth 4 (four) times daily as needed for cough., Disp: 118 mL, Rfl: 0  Observations/Objective: Patient is well-developed, well-nourished in no acute distress.  Resting comfortably  at home.  Head is normocephalic, atraumatic.  No labored breathing.  Speech is clear and coherent with logical content.  Patient is alert and oriented at baseline.  Coughing perisstently  Assessment and Plan: 1. Moderate persistent asthmatic bronchitis with acute exacerbation  Increase fluids, humidifier at night, tylenol or ibuprofen, use albuterol, use tessalon during the day and prometh dm at night give at urgent care. Return to urgent care if sx persist or worsen.   Follow Up Instructions: I discussed the assessment and treatment plan with the patient. The patient was provided an opportunity to ask questions and all were answered. The patient agreed with the plan and demonstrated an understanding of the instructions.  A copy of instructions were sent to the patient via MyChart unless otherwise noted below.     The patient was advised to call back or seek an in-person evaluation if the symptoms worsen or if the condition fails to improve as anticipated.  Time:  I spent 10 minutes with the patient via telehealth technology discussing the above problems/concerns.    [name], FNP

## 2022-04-17 NOTE — Patient Instructions (Signed)

## 2022-06-05 ENCOUNTER — Telehealth: Payer: BC Managed Care – PPO | Admitting: Family Medicine

## 2022-06-05 DIAGNOSIS — J0100 Acute maxillary sinusitis, unspecified: Secondary | ICD-10-CM | POA: Diagnosis not present

## 2022-06-05 DIAGNOSIS — U071 COVID-19: Secondary | ICD-10-CM

## 2022-06-05 MED ORDER — AMOXICILLIN-POT CLAVULANATE 875-125 MG PO TABS: 1.0000 | ORAL_TABLET | Freq: Two times a day (BID) | ORAL | 0 refills | Status: AC

## 2022-06-05 MED ORDER — PROMETHAZINE-DM 6.25-15 MG/5ML PO SYRP: 5.0000 mL | ORAL_SOLUTION | Freq: Four times a day (QID) | ORAL | 0 refills | Status: DC | PRN

## 2022-06-05 NOTE — Progress Notes (Signed)
Virtual Visit Consent   Loretta Burton, you are scheduled for a virtual visit with a [facility] provider today. Just as with appointments in the office, your consent must be obtained to participate. Your consent will be active for this visit and any virtual visit you may have with one of our providers in the next 365 days. If you have a MyChart account, a copy of this consent can be sent to you electronically.  As this is a virtual visit, video technology does not allow for your provider to perform a traditional examination. This may limit your provider's ability to fully assess your condition. If your provider identifies any concerns that need to be evaluated in person or the need to arrange testing (such as labs, EKG, etc.), we will make arrangements to do so. Although advances in technology are sophisticated, we cannot ensure that it will always work on either your end or our end. If the connection with a video visit is poor, the visit may have to be switched to a telephone visit. With either a video or telephone visit, we are not always able to ensure that we have a secure connection.  By engaging in this virtual visit, you consent to the provision of healthcare and authorize for your insurance to be billed (if applicable) for the services provided during this visit. Depending on your insurance coverage, you may receive a charge related to this service.  I need to obtain your verbal consent now. Are you willing to proceed with your visit today? Katarena Finer Brabson has provided verbal consent on 06/05/2022 for a virtual visit (video or telephone). [name], FNP  Date: 06/05/2022 2:03 PM  Virtual Visit via Video Note   I, [name], connected with  Loretta Burton  ([id number], [birth year]) on 06/05/22 at  2:00 PM EST by a video-enabled telemedicine application and verified that I am speaking with the correct person using two identifiers.  Location: Patient: Virtual Visit Location Patient:  Home Provider: Virtual Visit Location Provider: Home Office   I discussed the limitations of evaluation and management by telemedicine and the availability of in person appointments. The patient expressed understanding and agreed to proceed.    History of Present Illness: Loretta Burton is a 37 y.o. who identifies as a female who was assigned female at birth, and is being seen today for maxillary sinus pain and pressure with post nasal drainage for 5-6 days worsening. Cough. Both productive with yellow green mucus. No fever. Headaches. Cough worse at night. [name]  HPI: HPI  Problems:  Patient Active Problem List   Diagnosis Date Noted   Fever 12/25/2021   Preventative health care 01/26/2019   UTI (urinary tract infection) due to Enterococcus 01/04/2017   BMI 37.0-37.9, adult 12/24/2016   Viral URI with cough 02/04/2016   Recurrent sinusitis 11/21/2015   Insomnia 11/21/2015   Anxiety and depression 01/17/2014    Allergies:  Allergies  Allergen Reactions   Adhesive [Tape] Itching   Ciprofloxacin Hives and Nausea And Vomiting   Medications:  Current Outpatient Medications:    albuterol (VENTOLIN HFA) 108 (90 Base) MCG/ACT inhaler, Inhale 2 puffs into the lungs every 6 (six) hours as needed for wheezing or shortness of breath., Disp: 8 g, Rfl: 0   amoxicillin-clavulanate (AUGMENTIN) 875-125 MG tablet, Take 1 tablet by mouth in the morning and at bedtime for 10 days., Disp: 20 tablet, Rfl: 0   benzonatate (TESSALON) 100 MG capsule, Take 1 capsule (100 mg total) by  mouth 2 (two) times daily as needed for cough., Disp: 20 capsule, Rfl: 0   ciprofloxacin-dexamethasone (CIPRODEX) OTIC suspension, Place 4 drops into the left ear 2 (two) times daily. X4-7 days, Disp: 7.5 mL, Rfl: 0   etonogestrel (NEXPLANON) 68 MG IMPL implant, 1 each by Subdermal route once., Disp: , Rfl:    methylPREDNISolone (MEDROL DOSEPAK) 4 MG TBPK tablet, Use as directed with food in am, Disp: 21 tablet, Rfl: 0    promethazine-dextromethorphan (PROMETHAZINE-DM) 6.25-15 MG/5ML syrup, Take 5 mLs by mouth 4 (four) times daily as needed for cough., Disp: 118 mL, Rfl: 0  Observations/Objective: Patient is well-developed, well-nourished in no acute distress.  Resting comfortably  at home.  Head is normocephalic, atraumatic.  No labored breathing.  Speech is clear and coherent with logical content.  Patient is alert and oriented at baseline.    Assessment and Plan: 1. Acute maxillary sinusitis, recurrence not specified  2. COVID-19 - promethazine-dextromethorphan (PROMETHAZINE-DM) 6.25-15 MG/5ML syrup; Take 5 mLs by mouth 4 (four) times daily as needed for cough.  Dispense: 118 mL; Refill: 0  Increase fluids, humidifier at night, urgent care if sx persist or worsen. Instructed to take augmentin with food and probiotics.   Follow Up Instructions: I discussed the assessment and treatment plan with the patient. The patient was provided an opportunity to ask questions and all were answered. The patient agreed with the plan and demonstrated an understanding of the instructions.  A copy of instructions were sent to the patient via MyChart unless otherwise noted below.     The patient was advised to call back or seek an in-person evaluation if the symptoms worsen or if the condition fails to improve as anticipated.  Time:  I spent 10 minutes with the patient via telehealth technology discussing the above problems/concerns.    [name], FNP

## 2022-06-05 NOTE — Patient Instructions (Signed)

## 2022-08-24 ENCOUNTER — Ambulatory Visit: Payer: BC Managed Care – PPO | Admitting: Family Medicine

## 2022-08-24 ENCOUNTER — Encounter: Payer: Self-pay | Admitting: Family Medicine

## 2022-08-24 VITALS — BP 138/84 | HR 105 | Temp 97.6°F | Ht 64.0 in | Wt 228.0 lb

## 2022-08-24 DIAGNOSIS — J209 Acute bronchitis, unspecified: Secondary | ICD-10-CM | POA: Insufficient documentation

## 2022-08-24 DIAGNOSIS — U071 COVID-19: Secondary | ICD-10-CM

## 2022-08-24 MED ORDER — PREDNISONE 10 MG PO TABS: ORAL_TABLET | ORAL | 0 refills | Status: DC

## 2022-08-24 MED ORDER — BENZONATATE 100 MG PO CAPS
100.0000 mg | ORAL_CAPSULE | Freq: Two times a day (BID) | ORAL | 1 refills | Status: DC | PRN
Start: 2022-08-24 — End: 2022-10-30

## 2022-08-24 MED ORDER — PROMETHAZINE-DM 6.25-15 MG/5ML PO SYRP
5.0000 mL | ORAL_SOLUTION | Freq: Four times a day (QID) | ORAL | 0 refills | Status: DC | PRN
Start: 2022-08-24 — End: 2022-10-30

## 2022-08-24 NOTE — Assessment & Plan Note (Addendum)
S/p viral uri   (not covid 19) Px prednisone 40 mg taper  Discussed poss side effects  Tessalon prn cough  Also prometh dm prn pm cough/caution of sedation Discussed symptom control-see AVS Has albuterol inhaler Discussed ER precautions Update if not starting to improve in a week or if worsening

## 2022-08-24 NOTE — Patient Instructions (Signed)
Drink fluids  Rest when you can  Prednisone- take as directed  Prometh DM for night time cough   Tessalon during the day  Delsym is ok during the day also   Update if not starting to improve in a week or if worsening  If suddenly worse/severe- go to ER

## 2022-08-24 NOTE — Progress Notes (Signed)
Subjective:    Patient ID: Loretta Burton, female    DOB: [birth year], 37 y.o.   MRN: [id number]  HPI 37 yo pt of NP Clark presents for cough   Wt Readings from Last 3 Encounters:  08/24/22 228 lb (103.4 kg)  01/02/22 229 lb (103.9 kg)  12/25/21 227 lb 4 oz (103.1 kg)   39.14 kg/m  Vitals:   08/24/22 1204  BP: 138/84  Pulse: (!) 105  Temp: 97.6 F (36.4 C)  SpO2: 97%    2 wk ago had sinus symptoms - drainage and congestion This got worse after mowing the yard  Took 4 doses of left over amox from old illness Took that last week  Began cough/ hoarse voice  Worse at night  Some chest tightness  Got a bit better and then got worse   She is prone to bronchitis   No h/o asthma  Non smoker  2nd hand smoke when young     Taking Albuterol inhaler  Left over px cough med -prometh DM  For allergies  Claritin  Flonase   Patient Active Problem List   Diagnosis Date Noted   Acute bronchitis 08/24/2022   Fever 12/25/2021   Preventative health care 01/26/2019   UTI (urinary tract infection) due to Enterococcus 01/04/2017   BMI 37.0-37.9, adult 12/24/2016   Viral URI with cough 02/04/2016   Recurrent sinusitis 11/21/2015   Insomnia 11/21/2015   Anxiety and depression 01/17/2014   Past Medical History:  Diagnosis Date   Allergy    seasonal   Anemia    Cholelithiasis    Decreased fetus movements affecting management of mother in third trimester 08/12/2017   Depression    Depression affecting pregnancy 12/24/2016   Insomnia    Obesity affecting pregnancy 12/24/2016   Panic attack    Supervision of high-risk pregnancy 12/24/2016   [facility] Prenatal Labs Dating  Blood type:    Genetic Screen 1 Screen:    AFP:     Quad:     NIPS: Antibody:  Anatomic [place]  Rubella:   Varicella:   GTT Early:               Third trimester:  RPR:    Rhogam  HBsAg:    TDaP vaccine                       Flu Shot: HIV:    Baby Food                                GBS:   Contraception  Pap: CBB    CS/VBAC   Support Person          Past Surgical History:  Procedure Laterality Date   CHOLECYSTECTOMY N/A 11/28/2014   Procedure: LAPAROSCOPIC CHOLECYSTECTOMY WITH INTRAOPERATIVE CHOLANGIOGRAM;  Surgeon: Lattie Haw, MD;  Location: [facility] ORS;  Service: General;  Laterality: N/A;   WISDOM TOOTH EXTRACTION     x4   Social History   Tobacco Use   Smoking status: Never   Smokeless tobacco: Never  Substance Use Topics   Alcohol use: No   Drug use: No   Family History  Problem Relation Age of Onset   Diabetes Mother    Hypertension Mother    Depression Mother    Diabetes Maternal Grandmother    Hypertension Maternal Grandmother    Heart disease Maternal Grandmother  Cancer Maternal Grandmother        skin   Diabetes Paternal Grandmother    Hypertension Paternal Grandmother    Heart disease Paternal Grandmother    Diabetes Paternal Grandfather    Hypertension Paternal Grandfather    Heart disease Paternal Grandfather    Allergies  Allergen Reactions   Adhesive [Tape] Itching   Ciprofloxacin Hives and Nausea And Vomiting   Current Outpatient Medications on File Prior to Visit  Medication Sig Dispense Refill   etonogestrel (NEXPLANON) 68 MG IMPL implant 1 each by Subdermal route once.     fluticasone (FLONASE) 50 MCG/ACT nasal spray Place 2 sprays into both nostrils daily.     loratadine (CLARITIN) 10 MG tablet Take 10 mg by mouth daily.     No current facility-administered medications on file prior to visit.     Review of Systems  Constitutional:  Negative for activity change, appetite change, fatigue, fever and unexpected weight change.  HENT:  Positive for postnasal drip. Negative for congestion, ear pain, rhinorrhea, sinus pressure and sore throat.   Eyes:  Negative for pain, redness and visual disturbance.  Respiratory:  Positive for cough, chest tightness and wheezing. Negative for shortness of breath and stridor.   Cardiovascular:   Negative for chest pain and palpitations.  Gastrointestinal:  Negative for abdominal pain, blood in stool, constipation and diarrhea.  Endocrine: Negative for polydipsia and polyuria.  Genitourinary:  Negative for dysuria, frequency and urgency.  Musculoskeletal:  Negative for arthralgias, back pain and myalgias.  Skin:  Negative for pallor and rash.  Allergic/Immunologic: Negative for environmental allergies.  Neurological:  Negative for dizziness, syncope and headaches.  Hematological:  Negative for adenopathy. Does not bruise/bleed easily.  Psychiatric/Behavioral:  Negative for decreased concentration and dysphoric mood. The patient is not nervous/anxious.        Objective:   Physical Exam Constitutional:      General: She is not in acute distress.    Appearance: She is well-developed.  HENT:     Head: Normocephalic and atraumatic.     Right Ear: Tympanic membrane and ear canal normal.     Left Ear: Tympanic membrane and ear canal normal.     Mouth/Throat:     Mouth: Mucous membranes are moist.     Pharynx: Oropharynx is clear.     Comments: Mild clear pnd Eyes:     Conjunctiva/sclera: Conjunctivae normal.     Pupils: Pupils are equal, round, and reactive to light.  Neck:     Thyroid: No thyromegaly.     Vascular: No carotid bruit or JVD.  Cardiovascular:     Rate and Rhythm: Normal rate and regular rhythm.     Heart sounds: Normal heart sounds.     No gallop.  Pulmonary:     Effort: Pulmonary effort is normal. No respiratory distress.     Breath sounds: No stridor. Wheezing and rhonchi present. No rales.     Comments: Scattered rhonchi Wheeze on forced exp Chest:     Chest wall: No tenderness.  Abdominal:     General: There is no distension or abdominal bruit.     Palpations: Abdomen is soft.  Musculoskeletal:     Cervical back: Normal range of motion and neck supple.     Right lower leg: No edema.     Left lower leg: No edema.  Lymphadenopathy:     Cervical: No  cervical adenopathy.  Skin:    General: Skin is warm and dry.  Coloration: Skin is not pale.     Findings: No rash.  Neurological:     Mental Status: She is alert.     Coordination: Coordination normal.     Deep Tendon Reflexes: Reflexes are normal and symmetric. Reflexes normal.  Psychiatric:        Mood and Affect: Mood normal.           Assessment & Plan:   Problem List Items Addressed This Visit       Respiratory   Acute bronchitis - Primary    S/p viral uri   (not covid 19) Px prednisone 40 mg taper  Discussed poss side effects  Tessalon prn cough  Also prometh dm prn pm cough/caution of sedation Discussed symptom control-see AVS Has albuterol inhaler Discussed ER precautions Update if not starting to improve in a week or if worsening        Other Visit Diagnoses     COVID-19       Relevant Medications   promethazine-dextromethorphan (PROMETHAZINE-DM) 6.25-15 MG/5ML syrup   benzonatate (TESSALON) 100 MG capsule

## 2022-09-01 ENCOUNTER — Encounter: Payer: Self-pay | Admitting: Family Medicine

## 2022-09-01 ENCOUNTER — Telehealth: Payer: BC Managed Care – PPO | Admitting: Family Medicine

## 2022-09-01 VITALS — Temp 96.6°F | Ht 64.0 in | Wt 227.0 lb

## 2022-09-01 DIAGNOSIS — J340 Abscess, furuncle and carbuncle of nose: Secondary | ICD-10-CM | POA: Insufficient documentation

## 2022-09-01 DIAGNOSIS — J209 Acute bronchitis, unspecified: Secondary | ICD-10-CM

## 2022-09-01 HISTORY — DX: Abscess, furuncle and carbuncle of nose: J34.0

## 2022-09-01 MED ORDER — MUPIROCIN 2 % EX OINT: 1.0000 | TOPICAL_OINTMENT | Freq: Two times a day (BID) | CUTANEOUS | 0 refills | Status: DC

## 2022-09-01 MED ORDER — DOXYCYCLINE HYCLATE 100 MG PO TABS
100.0000 mg | ORAL_TABLET | Freq: Two times a day (BID) | ORAL | 0 refills | Status: DC
Start: 2022-09-01 — End: 2022-10-30

## 2022-09-01 NOTE — Assessment & Plan Note (Signed)
Acute, worsening.  No red flags for admission. 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  Encouraged her to continue and increase warm compresses 3-4 times a day to help the area drain.  Continue topical mupirocin ointment twice daily.  Doxycycline 100 mg p.o. twice daily x 10 days Return and ER precautions provided, encouraged her to see a provider in person if not improving in 24 to 48 hours.

## 2022-09-01 NOTE — Progress Notes (Signed)
VIRTUAL VISIT A virtual visit is felt to be most appropriate for this patient at this time.   I connected with the patient on 09/01/22 at 12:00 PM EDT by virtual telehealth platform and verified that I am speaking with the correct person using two identifiers.   I discussed the limitations, risks, security and privacy concerns of performing an evaluation and management service by  virtual telehealth platform and the availability of in person appointments. I also discussed with the patient that there may be a patient responsible charge related to this service. The patient expressed understanding and agreed to proceed.  Patient location: Home Provider Location: [place] Participants: Kerby Nora and Jordan Likes   Chief Complaint  Patient presents with   Sores Inside Nose    Right Nostril - Seen by Dr. Milinda Antis on 08/24/22 for Bronchitis    History of Present Illness:  37 y.o. female patient of Doreene Nest, NP presents with new onset sores inside right nostril following treatment May 6 for bronchitis.  Reviewed last office visit note from Aug 24, 2022 with Dr. Milinda Antis for acute bronchitis.  Treated with benzonatate, promethazine dextromethorphan cough syrup and prednisone taper. Felt likely viral URI, no antibiotic given.  Today she reports prednisone has helped and she has 4 more days of the taper.  Cough and SOB improving, but still some wheeze and use of albuterol.  No fever.   She has new onset redness and swelling on right nostril.  Very tender.  Redness spreading up nostril.[name] over centrally.  Some tenderness in anterior neck of her cervical lymph nodes  She can feel large knot inside nostril, extremely tender  Has been applying BID mupirocin, with no improvement..  Husband has MRSA.    COVID 19 screen No recent travel or known exposure to COVID19 The patient denies respiratory symptoms of COVID 19 at this time.  The importance of social distancing was  discussed today.   Review of Systems  Constitutional:  Negative for chills and fever.  HENT:  Positive for congestion. Negative for ear pain.   Eyes:  Negative for pain and redness.  Respiratory:  Positive for cough. Negative for shortness of breath.   Cardiovascular:  Negative for chest pain, palpitations and leg swelling.  Gastrointestinal:  Negative for abdominal pain, blood in stool, constipation, diarrhea, nausea and vomiting.  Genitourinary:  Negative for dysuria.  Musculoskeletal:  Negative for falls and myalgias.  Skin:  Negative for rash.  Neurological:  Negative for dizziness.  Psychiatric/Behavioral:  Negative for depression. The patient is not nervous/anxious.       Past Medical History:  Diagnosis Date   Allergy    seasonal   Anemia    Cholelithiasis    Decreased fetus movements affecting management of mother in third trimester 08/12/2017   Depression    Depression affecting pregnancy 12/24/2016   Insomnia    Obesity affecting pregnancy 12/24/2016   Panic attack    Supervision of high-risk pregnancy 12/24/2016   [facility] Prenatal Labs Dating  Blood type:    Genetic Screen 1 Screen:    AFP:     Quad:     NIPS: Antibody:  Anatomic [place]  Rubella:   Varicella:   GTT Early:               Third trimester:  RPR:    Rhogam  HBsAg:    TDaP vaccine  Flu Shot: HIV:    Baby Food                                GBS:  Contraception  Pap: CBB    CS/VBAC   Support Person           reports that she has never smoked. She has never used smokeless tobacco. She reports that she does not drink alcohol and does not use drugs.   Current Outpatient Medications:    albuterol (VENTOLIN HFA) 108 (90 Base) MCG/ACT inhaler, Inhale 1-2 puffs into the lungs every 6 (six) hours as needed for wheezing or shortness of breath., Disp: , Rfl:    benzonatate (TESSALON) 100 MG capsule, Take 1 capsule (100 mg total) by mouth 2 (two) times daily as needed for cough. Swallow whole, Disp: 20  capsule, Rfl: 1   doxycycline (VIBRA-TABS) 100 MG tablet, Take 1 tablet (100 mg total) by mouth 2 (two) times daily., Disp: 20 tablet, Rfl: 0   etonogestrel (NEXPLANON) 68 MG IMPL implant, 1 each by Subdermal route once., Disp: , Rfl:    fluticasone (FLONASE) 50 MCG/ACT nasal spray, Place 2 sprays into both nostrils daily., Disp: , Rfl:    loratadine (CLARITIN) 10 MG tablet, Take 10 mg by mouth daily., Disp: , Rfl:    predniSONE (DELTASONE) 10 MG tablet, Take 4 pills once daily by mouth for 3 days, then 3 pills daily for 3 days, then 2 pills daily for 3 days then 1 pill daily for 3 days then stop, Disp: 30 tablet, Rfl: 0   promethazine-dextromethorphan (PROMETHAZINE-DM) 6.25-15 MG/5ML syrup, Take 5 mLs by mouth 4 (four) times daily as needed for cough. Cautoin of sedation, Disp: 118 mL, Rfl: 0   mupirocin ointment (BACTROBAN) 2 %, Apply 1 Application topically 2 (two) times daily., Disp: 22 g, Rfl: 0   Observations/Objective: Temperature (!) 96.6 F (35.9 C), temperature source Axillary, height 5\' 4"  (1.626 m), weight 227 lb (103 kg), unknown if currently breastfeeding.  Physical Exam Constitutional:      General: The patient is not in acute distress. Pulmonary:     Effort: Pulmonary effort is normal. No respiratory distress.  Neurological:     Mental Status: The patient is alert and oriented to person, place, and time.  Psychiatric:        Mood and Affect: Mood normal.        Behavior: Behavior normal.    Assessment and Plan Abscess of nose Assessment & Plan: Acute, worsening.  No red flags for admission. 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  Encouraged her to continue and increase warm compresses 3-4 times a day to help the area drain.  Continue topical mupirocin ointment twice daily.  Doxycycline 100 mg p.o. twice daily x 10 days Return and ER precautions provided, encouraged her to see a provider in person if  not improving in 24 to 48 hours.   Acute bronchitis, unspecified organism Assessment & Plan: Acute, improving  Continue completion of prednisone taper, use albuterol as needed.  Of note doxycycline used for nasal abscess will cover for atypical pneumonia.   Other orders -     Doxycycline Hyclate; Take 1 tablet (100 mg total) by mouth 2 (two) times daily.  Dispense: 20 tablet; Refill: 0 -     Mupirocin; Apply 1 Application topically 2 (two)  times daily.  Dispense: 22 g; Refill: 0      I discussed the assessment and treatment plan with the patient. The patient was provided an opportunity to ask questions and all were answered. The patient agreed with the plan and demonstrated an understanding of the instructions.   The patient was advised to call back or seek an in-person evaluation if the symptoms worsen or if the condition fails to improve as anticipated.     Kerby Nora, MD

## 2022-09-01 NOTE — Assessment & Plan Note (Signed)
Acute, improving  Continue completion of prednisone taper, use albuterol as needed.  Of note doxycycline used for nasal abscess will cover for atypical pneumonia.

## 2022-10-30 ENCOUNTER — Ambulatory Visit: Payer: BC Managed Care – PPO | Admitting: Primary Care

## 2022-10-30 ENCOUNTER — Encounter: Payer: Self-pay | Admitting: Primary Care

## 2022-10-30 VITALS — BP 118/78 | HR 105 | Temp 98.6°F | Ht 64.0 in | Wt 230.0 lb

## 2022-10-30 DIAGNOSIS — F419 Anxiety disorder, unspecified: Secondary | ICD-10-CM | POA: Diagnosis not present

## 2022-10-30 DIAGNOSIS — F32A Depression, unspecified: Secondary | ICD-10-CM

## 2022-10-30 MED ORDER — SERTRALINE HCL 50 MG PO TABS
50.0000 mg | ORAL_TABLET | Freq: Every day | ORAL | 0 refills | Status: DC
Start: 2022-10-30 — End: 2023-01-22

## 2022-10-30 NOTE — Patient Instructions (Signed)
Start sertraline (Zoloft) 50 mg for anxiety and depression. Take 1/2 tablet by mouth once daily for about one week, then increase to 1 full tablet thereafter.   Please update me via MyChart in about 4 weeks.  It was a pleasure to see you today!

## 2022-10-30 NOTE — Assessment & Plan Note (Signed)
Deteriorated.  Discussed options.  Given weight gain with Lexapro, will try Zoloft. She agrees.  Patient is to take 1/2 tablet daily for 8 days, then advance to 1 full tablet thereafter. We discussed possible side effects of headache, GI upset, drowsiness, and SI/HI. If thoughts of SI/HI develop, we discussed to present to the emergency immediately. Patient verbalized understanding.   She will update in 4 weeks via MyChart.

## 2022-10-30 NOTE — Progress Notes (Signed)
Subjective:    Patient ID: Loretta Burton, female    DOB: 1985-09-07, 37 y.o.   MRN: [id number]  Depression        Associated symptoms include fatigue.   Loretta Burton is a very pleasant 37 y.o. female with a history of anxiety and depression, insomnia who presents today to discuss anxiety and depression.   Previously managed on Lexapro at 20 mg for anxiety/depression.  She did well on this regimen, but she would like to try something different due to her weight gain.  She has not taken Lexapro for about 2 years.   She would like to resume medication because over the last 2 years she's struggled with anxiety and depression with worsening symptoms over the last 6 months.   Symptoms include irritability, sleep disturbance and waking during the night, feeling down/sad, has a hard time letting things go, frequent worrying. She was recently written up at work for her irritability which is not like her.   She is exercising at the gym several days weekly for the last 7 weeks, has lost 10 pounds. This has helped to calm her and help her to sleep. She denies SI/HI.    Review of Systems  Constitutional:  Positive for fatigue.  Respiratory:  Positive for shortness of breath.   Cardiovascular:  Positive for palpitations.  Psychiatric/Behavioral:  Positive for depression. The patient is nervous/anxious.          Past Medical History:  Diagnosis Date   Allergy    seasonal   Anemia    Cholelithiasis    Decreased fetus movements affecting management of mother in third trimester 08/12/2017   Depression    Depression affecting pregnancy 12/24/2016   Insomnia    Obesity affecting pregnancy 12/24/2016   Panic attack    Supervision of high-risk pregnancy 12/24/2016   [facility] P[facility]atal Labs Dating  Blood type:    Genetic Screen 1 Screen:    AFP:     Quad:     NIPS: Antibody:  Anatomic [place]  Rubella:   Varicella:   GTT Early:               Third trimester:  RPR:    Rhogam  HBsAg:    TDaP  vaccine                       Flu Shot: HIV:    Baby Food                                GBS:  Contraception  Pap: CBB    CS/VBAC   Support Person           Social History   Socioeconomic History   Marital status: Married    Spouse name: Not on file   Number of children: Not on file   Years of education: Not on file   Highest education level: Not on file  Occupational History   Not on file  Tobacco Use   Smoking status: Never   Smokeless tobacco: Never  Substance and Sexual Activity   Alcohol use: No   Drug use: No   Sexual activity: Yes    Partners: Male    Birth control/protection: None  Other Topics Concern   Not on file  Social History Narrative   Married.   1 daughter.   Works at [employer] as a Lawyer.  Enjoys crafting, spending time with her daughter   Social Determinants of Health   Financial Resource Strain: Not on file  Food Insecurity: Not on file  Transportation Needs: Not on file  Physical Activity: Not on file  Stress: Not on file  Social Connections: Unknown (09/02/2021)   Received from [facility], [facility]   Social Network    Social Network: Not on file  Intimate Partner Violence: Unknown (07/25/2021)   Received from [facility], [facility]   HITS    Physically Hurt: Not on file    Insult or Talk Down To: Not on file    Threaten Physical Harm: Not on file    Scream or Curse: Not on file    Past Surgical History:  Procedure Laterality Date   CHOLECYSTECTOMY N/A 11/28/2014   Procedure: LAPAROSCOPIC CHOLECYSTECTOMY WITH INTRAOPERATIVE CHOLANGIOGRAM;  Surgeon: Lattie Haw, MD;  Location: [facility] ORS;  Service: General;  Laterality: N/A;   WISDOM TOOTH EXTRACTION     x4    Family History  Problem Relation Age of Onset   Diabetes Mother    Hypertension Mother    Depression Mother    Diabetes Maternal Grandmother    Hypertension Maternal Grandmother    Heart disease Maternal Grandmother    Cancer Maternal Grandmother         skin   Diabetes Paternal Grandmother    Hypertension Paternal Grandmother    Heart disease Paternal Grandmother    Diabetes Paternal Grandfather    Hypertension Paternal Grandfather    Heart disease Paternal Grandfather     Allergies  Allergen Reactions   Adhesive [Tape] Itching   Ciprofloxacin Hives and Nausea And Vomiting    Current Outpatient Medications on File Prior to Visit  Medication Sig Dispense Refill   etonogestrel (NEXPLANON) 68 MG IMPL implant 1 each by Subdermal route once.     fluticasone (FLONASE) 50 MCG/ACT nasal spray Place 2 sprays into both nostrils daily.     loratadine (CLARITIN) 10 MG tablet Take 10 mg by mouth daily.     No current facility-administered medications on file prior to visit.    BP 118/78   Pulse (!) 105   Temp 98.6 F (37 C) (Temporal)   Ht 5\' 4"  (1.626 m)   Wt 230 lb (104.3 kg)   SpO2 98%   BMI 39.48 kg/m  Objective:   Physical Exam Cardiovascular:     Rate and Rhythm: Normal rate and regular rhythm.  Pulmonary:     Effort: Pulmonary effort is normal.     Breath sounds: Normal breath sounds.  Musculoskeletal:     Cervical back: Neck supple.  Skin:    General: Skin is warm and dry.  Psychiatric:     Comments: Tearful slightly during visit            Assessment & Plan:  Anxiety and depression Assessment & Plan: Deteriorated.  Discussed options.  Given weight gain with Lexapro, will try Zoloft. She agrees.  Patient is to take 1/2 tablet daily for 8 days, then advance to 1 full tablet thereafter. We discussed possible side effects of headache, GI upset, drowsiness, and SI/HI. If thoughts of SI/HI develop, we discussed to present to the emergency immediately. Patient verbalized understanding.   She will update in 4 weeks via MyChart.   Orders: -     Sertraline HCl; Take 1 tablet (50 mg total) by mouth daily. for anxiety and depression.  Dispense: 90 tablet; Refill: 0  Doreene Nest, NP

## 2022-12-03 ENCOUNTER — Encounter (INDEPENDENT_AMBULATORY_CARE_PROVIDER_SITE_OTHER): Payer: Self-pay

## 2022-12-25 ENCOUNTER — Encounter: Payer: Self-pay | Admitting: Primary Care

## 2022-12-25 ENCOUNTER — Ambulatory Visit (INDEPENDENT_AMBULATORY_CARE_PROVIDER_SITE_OTHER): Payer: BC Managed Care – PPO | Admitting: Primary Care

## 2022-12-25 VITALS — BP 134/78 | HR 97 | Temp 97.2°F | Ht 64.0 in | Wt 217.0 lb

## 2022-12-25 DIAGNOSIS — Z0000 Encounter for general adult medical examination without abnormal findings: Secondary | ICD-10-CM | POA: Diagnosis not present

## 2022-12-25 DIAGNOSIS — F32A Depression, unspecified: Secondary | ICD-10-CM

## 2022-12-25 DIAGNOSIS — F419 Anxiety disorder, unspecified: Secondary | ICD-10-CM | POA: Diagnosis not present

## 2022-12-25 DIAGNOSIS — E785 Hyperlipidemia, unspecified: Secondary | ICD-10-CM | POA: Diagnosis not present

## 2022-12-25 DIAGNOSIS — G4700 Insomnia, unspecified: Secondary | ICD-10-CM | POA: Diagnosis not present

## 2022-12-25 DIAGNOSIS — Z23 Encounter for immunization: Secondary | ICD-10-CM | POA: Diagnosis not present

## 2022-12-25 LAB — LIPID PANEL
Cholesterol: 221 mg/dL — ABNORMAL HIGH (ref 0–200)
HDL: 42.7 mg/dL (ref >39.00)
LDL Cholesterol: 126 mg/dL — ABNORMAL HIGH (ref 0–99)
NonHDL: 177.99
Total CHOL/HDL Ratio: 5
Triglycerides: 258 mg/dL — ABNORMAL HIGH (ref 0.0–149.0)
VLDL: 51.6 mg/dL — ABNORMAL HIGH (ref 0.0–40.0)

## 2022-12-25 LAB — COMPREHENSIVE METABOLIC PANEL
ALT: 21 U/L (ref 0–35)
AST: 18 U/L (ref 0–37)
Albumin: 4.5 g/dL (ref 3.5–5.2)
Alkaline Phosphatase: 65 U/L (ref 39–117)
BUN: 16 mg/dL (ref 6–23)
CO2: 30 meq/L (ref 19–32)
Calcium: 10.2 mg/dL (ref 8.4–10.5)
Chloride: 101 meq/L (ref 96–112)
Creatinine, Ser: 0.73 mg/dL (ref 0.40–1.20)
GFR: 106.37 mL/min (ref >60.00)
Glucose, Bld: 87 mg/dL (ref 70–99)
Potassium: 3.7 meq/L (ref 3.5–5.1)
Sodium: 139 meq/L (ref 135–145)
Total Bilirubin: 0.5 mg/dL (ref 0.2–1.2)
Total Protein: 7.8 g/dL (ref 6.0–8.3)

## 2022-12-25 NOTE — Assessment & Plan Note (Addendum)
Immunizations UTD. Influenza vaccine provided today. Pap smear UTD. Follows with GYN  Commended her on regular exercise.  Exam stable. Labs pending.  Follow up in 1 year for repeat physical.

## 2022-12-25 NOTE — Assessment & Plan Note (Signed)
Overall controlled per patient.  Continue OTC treatment PRN Continue to monitor.

## 2022-12-25 NOTE — Patient Instructions (Signed)
Stop by the lab prior to leaving today. I will notify you of your results once received.   It was a pleasure to see you today!  

## 2022-12-25 NOTE — Assessment & Plan Note (Signed)
Commended her on regular exercise. Repeat lipid panel pending.

## 2022-12-25 NOTE — Progress Notes (Signed)
Subjective:    Patient ID: Loretta Burton, female    DOB: 01-21-1986, 37 y.o.   MRN: [id number]  HPI  Loretta Burton is a very pleasant 37 y.o. female who presents today for complete physical and follow up of chronic conditions.  Immunizations: -Tetanus: Completed in 2019 -Influenza: Completed today  Diet: Fair diet.  Exercise: Exercising 4 days weekly  Eye exam: Completes annually  Dental exam: Completes semi-annually    Pap Smear: Completed in 2023  BP Readings from Last 3 Encounters:  12/25/22 134/78  10/30/22 118/78  08/24/22 138/84    Wt Readings from Last 3 Encounters:  12/25/22 217 lb (98.4 kg)  10/30/22 230 lb (104.3 kg)  09/01/22 227 lb (103 kg)      Review of Systems  Constitutional:  Negative for unexpected weight change.  HENT:  Negative for rhinorrhea.   Respiratory:  Negative for cough and shortness of breath.   Cardiovascular:  Negative for chest pain.  Gastrointestinal:  Negative for constipation and diarrhea.  Genitourinary:  Negative for difficulty urinating and menstrual problem.  Musculoskeletal:  Negative for arthralgias and myalgias.  Skin:  Negative for rash.  Allergic/Immunologic: Negative for environmental allergies.  Neurological:  Positive for headaches. Negative for dizziness.  Psychiatric/Behavioral:  The patient is not nervous/anxious.          Past Medical History:  Diagnosis Date   Abscess of nose 09/01/2022   Allergy    seasonal   Anemia    Cholelithiasis    Decreased fetus movements affecting management of mother in third trimester 08/12/2017   Depression    Depression affecting pregnancy 12/24/2016   Insomnia    Obesity affecting pregnancy 12/24/2016   Panic attack    Supervision of high-risk pregnancy 12/24/2016   [facility] Labs Dating  Blood type:    Genetic Screen 1 Screen:    AFP:     Quad:     NIPS: Antibody:  Anatomic [place]  Rubella:   Varicella:   GTT Early:               Third trimester:   RPR:    Rhogam  HBsAg:    TDaP vaccine                       Flu Shot: HIV:    Baby Food                                GBS:  Contraception  Pap: CBB    CS/VBAC   Support Person          UTI (urinary tract infection) due to Enterococcus 01/04/2017   Treated with Macrobid 9/17 (not sensitive to Keflex)  [ ]  Needs TOC mid october      Social History   Socioeconomic History   Marital status: Married    Spouse name: Not on file   Number of children: Not on file   Years of education: Not on file   Highest education level: Not on file  Occupational History   Not on file  Tobacco Use   Smoking status: Never   Smokeless tobacco: Never  Substance and Sexual Activity   Alcohol use: No   Drug use: No   Sexual activity: Yes    Partners: Male    Birth control/protection: None  Other Topics Concern   Not on file  Social History Narrative  Married.   1 daughter.   Works at [employer] as a Lawyer.   Enjoys crafting, spending time with her daughter   Social Determinants of Health   Financial Resource Strain: Not on file  Food Insecurity: Not on file  Transportation Needs: Not on file  Physical Activity: Not on file  Stress: Not on file  Social Connections: Unknown (09/02/2021)   Received from [facility], [facility]   Social Network    Social Network: Not on file  Intimate Partner Violence: Unknown (07/25/2021)   Received from [facility], [facility]   HITS    Physically Hurt: Not on file    Insult or Talk Down To: Not on file    Threaten Physical Harm: Not on file    Scream or Curse: Not on file    Past Surgical History:  Procedure Laterality Date   CHOLECYSTECTOMY N/A 11/28/2014   Procedure: LAPAROSCOPIC CHOLECYSTECTOMY WITH INTRAOPERATIVE CHOLANGIOGRAM;  Surgeon: Lattie Haw, MD;  Location: [facility] ORS;  Service: General;  Laterality: N/A;   WISDOM TOOTH EXTRACTION     x4    Family History  Problem Relation Age of Onset   Diabetes Mother     Hypertension Mother    Depression Mother    Diabetes Maternal Grandmother    Hypertension Maternal Grandmother    Heart disease Maternal Grandmother    Cancer Maternal Grandmother        skin   Diabetes Paternal Grandmother    Hypertension Paternal Grandmother    Heart disease Paternal Grandmother    Diabetes Paternal Grandfather    Hypertension Paternal Grandfather    Heart disease Paternal Grandfather     Allergies  Allergen Reactions   Adhesive [Tape] Itching   Ciprofloxacin Hives and Nausea And Vomiting    Current Outpatient Medications on File Prior to Visit  Medication Sig Dispense Refill   etonogestrel (NEXPLANON) 68 MG IMPL implant 1 each by Subdermal route once.     fluticasone (FLONASE) 50 MCG/ACT nasal spray Place 2 sprays into both nostrils daily.     loratadine (CLARITIN) 10 MG tablet Take 10 mg by mouth daily.     sertraline (ZOLOFT) 50 MG tablet Take 1 tablet (50 mg total) by mouth daily. for anxiety and depression. 90 tablet 0   No current facility-administered medications on file prior to visit.    BP 134/78   Pulse 97   Temp (!) 97.2 F (36.2 C) (Temporal)   Ht 5\' 4"  (1.626 m)   Wt 217 lb (98.4 kg)   LMP 12/21/2022   SpO2 97%   BMI 37.25 kg/m  Objective:   Physical Exam HENT:     Right Ear: Tympanic membrane and ear canal normal.     Left Ear: Tympanic membrane and ear canal normal.     Nose: Nose normal.  Eyes:     Conjunctiva/sclera: Conjunctivae normal.     Pupils: Pupils are equal, round, and reactive to light.  Neck:     Thyroid: No thyromegaly.  Cardiovascular:     Rate and Rhythm: Normal rate and regular rhythm.     Heart sounds: No murmur heard. Pulmonary:     Effort: Pulmonary effort is normal.     Breath sounds: Normal breath sounds. No rales.  Abdominal:     General: Bowel sounds are normal.     Palpations: Abdomen is soft.     Tenderness: There is no abdominal tenderness.  Musculoskeletal:  General: Normal range of  motion.     Cervical back: Neck supple.  Lymphadenopathy:     Cervical: No cervical adenopathy.  Skin:    General: Skin is warm and dry.     Findings: No rash.  Neurological:     Mental Status: She is alert and oriented to person, place, and time.     Cranial Nerves: No cranial nerve deficit.     Deep Tendon Reflexes: Reflexes are normal and symmetric.  Psychiatric:        Mood and Affect: Mood normal.           Assessment & Plan:  Preventative health care Assessment & Plan: Immunizations UTD. Influenza vaccine provided today. Pap smear UTD. Follows with GYN  Commended her on regular exercise.  Exam stable. Labs pending.  Follow up in 1 year for repeat physical.    Encounter for immunization -     Flu vaccine trivalent PF, 6mos and older(Flulaval,Afluria,Fluarix,Fluzone)  Anxiety and depression Assessment & Plan: Controlled and improved.  Continue Zoloft 50 mg daily.   Insomnia, unspecified type Assessment & Plan: Overall controlled per patient.  Continue OTC treatment PRN Continue to monitor.    Hyperlipidemia, unspecified hyperlipidemia type Assessment & Plan: Commended her on regular exercise!  Repeat lipid panel pending.  Orders: -     Comprehensive metabolic panel -     Lipid panel        Doreene Nest, NP

## 2022-12-25 NOTE — Assessment & Plan Note (Signed)
Controlled and improved.  Continue Zoloft 50 mg daily.

## 2023-01-22 ENCOUNTER — Other Ambulatory Visit: Payer: Self-pay | Admitting: Primary Care

## 2023-01-22 DIAGNOSIS — F419 Anxiety disorder, unspecified: Secondary | ICD-10-CM

## 2023-01-28 ENCOUNTER — Telehealth (INDEPENDENT_AMBULATORY_CARE_PROVIDER_SITE_OTHER): Payer: BC Managed Care – PPO | Admitting: Primary Care

## 2023-01-28 ENCOUNTER — Encounter: Payer: Self-pay | Admitting: Primary Care

## 2023-01-28 DIAGNOSIS — F32A Depression, unspecified: Secondary | ICD-10-CM | POA: Diagnosis not present

## 2023-01-28 DIAGNOSIS — F419 Anxiety disorder, unspecified: Secondary | ICD-10-CM | POA: Diagnosis not present

## 2023-01-28 MED ORDER — BUSPIRONE HCL 5 MG PO TABS: 5.0000 mg | ORAL_TABLET | Freq: Two times a day (BID) | ORAL | 0 refills | Status: DC

## 2023-01-28 NOTE — Patient Instructions (Signed)
Start buspirone (Buspar) 5 mg tablets for anxiety. Take 1 tablet once daily for 1 week, then increase to twice daily if needed.  Continue Zoloft as prescribed.   Keep me updated! Mayra Reel, NP-C
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
## 2023-03-24 ENCOUNTER — Encounter: Payer: Self-pay | Admitting: Family

## 2023-03-24 ENCOUNTER — Telehealth: Payer: BC Managed Care – PPO | Admitting: Family

## 2023-03-24 DIAGNOSIS — L03213 Periorbital cellulitis: Secondary | ICD-10-CM

## 2023-03-24 DIAGNOSIS — H44002 Unspecified purulent endophthalmitis, left eye: Secondary | ICD-10-CM | POA: Diagnosis not present

## 2023-03-24 DIAGNOSIS — H04322 Acute dacryocystitis of left lacrimal passage: Secondary | ICD-10-CM | POA: Diagnosis not present

## 2023-03-24 MED ORDER — AMOXICILLIN-POT CLAVULANATE 875-125 MG PO TABS
1.0000 | ORAL_TABLET | Freq: Two times a day (BID) | ORAL | 0 refills | Status: AC
Start: 2023-03-24 — End: 2023-04-03

## 2023-03-24 MED ORDER — SULFAMETHOXAZOLE-TRIMETHOPRIM 800-160 MG PO TABS
1.0000 | ORAL_TABLET | Freq: Two times a day (BID) | ORAL | 0 refills | Status: AC
Start: 2023-03-24 — End: 2023-03-31

## 2023-03-24 NOTE — Assessment & Plan Note (Signed)
Evaluation limited due to video visit  However did advise pt periorbital vs orbital did let her know that orbital can be very serious.  Pt is going to hang up and call ophthalmology to see if she can be seen in office today to r/o  Stat cbc ordered  Ddx suspected periorbital however can not r/o orbital as with slight pain with EOM Will give rx augmentin 875/125 mg po bid x 10 days And Rx bactrim 800/160 mg po bid x 7 days  Discussed how to wash eye appropriately with warm warm cloth from inner to outer canthus. Wash bed sheets and pillow cases to prevent re-infection. Frequent handwashing. Pt does wear contacts, at current as well, advised her to take out and throw away.  Red flag precautions given to pt of when to go to er and or call 911.

## 2023-03-24 NOTE — Progress Notes (Signed)
Virtual Visit via Video note  I connected with Loretta Burton on 03/24/23 at home by video and verified that I am speaking with the correct person using two identifiers.The provider, Mort Sawyers, FNP is located in their office at time of visit.  I discussed the limitations, risks, security and privacy concerns of performing an evaluation and management service by video and the availability of in person appointments. I also discussed with the patient that there may be a patient responsible charge related to this service. The patient expressed understanding and agreed to proceed.  Subjective: PCP: Doreene Nest, NP  Chief Complaint  Patient presents with   Stye    L eye - started 3 days ago. Has been using warm compresses with minimal relief. Pain is radiating down her face in to her ear.    HPI  Four days ago started with left upper eye 'stye' stared to apply compresses several times throughout the day. This am was pretty swollen when she woke up and on her upper eyelid. She states she kept applying warm heat with mild improvement but the pain is worsening and starting to radiate to her left inner ear. She is not noticing crusting in the am. Slight dry discharge inner eye. She does have tenderness on her left neck/lymph node. No sore throat, slight nasal congestion mainly in the am. Sneezing often as well. No fever.   No blurry vision or change in vision. She does state gritty sensation with movement of her eyes.  There is some tenderness upon movement of her eyes, she states she has the sensation that something is 'pulling' along her left side at times.      ROS: Per HPI  Current Outpatient Medications:    amoxicillin-clavulanate (AUGMENTIN) 875-125 MG tablet, Take 1 tablet by mouth 2 (two) times daily for 10 days., Disp: 20 tablet, Rfl: 0   etonogestrel (NEXPLANON) 68 MG IMPL implant, 1 each by Subdermal route once., Disp: , Rfl:    fluticasone (FLONASE) 50 MCG/ACT nasal  spray, Place 2 sprays into both nostrils daily., Disp: , Rfl:    loratadine (CLARITIN) 10 MG tablet, Take 10 mg by mouth daily., Disp: , Rfl:    sertraline (ZOLOFT) 50 MG tablet, TAKE 1 TABLET BY MOUTH ONCE DAILY FOR ANXIETY AND FOR DEPRESSION, Disp: 90 tablet, Rfl: 2   sulfamethoxazole-trimethoprim (BACTRIM DS) 800-160 MG tablet, Take 1 tablet by mouth 2 (two) times daily for 7 days., Disp: 14 tablet, Rfl: 0  Observations/Objective: Physical Exam Constitutional:      General: She is not in acute distress.    Appearance: Normal appearance. She is not ill-appearing.  Eyes:     General: Gaze aligned appropriately. No allergic shiner.       Left eye: Discharge (inner canthus, dry brown coloration) present.    Extraocular Movements: Extraocular movements intact.     Conjunctiva/sclera:     Left eye: No hemorrhage.    Comments: Mildly painful eye movements Left upper eyelid with edema from inner to outer canthus   Pt palpated and reported periauricular area with tenderness   Pt palpated and reported tenderness on cervical anterior region of neck  Pulmonary:     Effort: Pulmonary effort is normal.  Neurological:     General: No focal deficit present.     Mental Status: She is alert and oriented to person, place, and time.  Psychiatric:        Mood and Affect: Mood normal.  Behavior: Behavior normal.        Thought Content: Thought content normal.   Physical exam limited by video visit.    Assessment and Plan: Periorbital cellulitis of left eye Assessment & Plan: Evaluation limited due to video visit  However did advise pt periorbital vs orbital did let her know that orbital can be very serious.  Pt is going to hang up and call ophthalmology to see if she can be seen in office today to r/o  Stat cbc ordered  Ddx suspected periorbital however can not r/o orbital as with slight pain with EOM Will give rx augmentin 875/125 mg po bid x 10 days And Rx bactrim 800/160 mg po bid x  7 days  Discussed how to wash eye appropriately with warm warm cloth from inner to outer canthus. Wash bed sheets and pillow cases to prevent re-infection. Frequent handwashing. Pt does wear contacts, at current as well, advised her to take out and throw away.  Red flag precautions given to pt of when to go to er and or call 911.   Orders: -     Amoxicillin-Pot Clavulanate; Take 1 tablet by mouth 2 (two) times daily for 10 days.  Dispense: 20 tablet; Refill: 0 -     Sulfamethoxazole-Trimethoprim; Take 1 tablet by mouth 2 (two) times daily for 7 days.  Dispense: 14 tablet; Refill: 0  Eye infection, left -     CBC with Differential/Platelet; Future    Follow Up Instructions: Return in about 1 week (around 03/31/2023) for with Vernona Rieger, PCP.   I discussed the assessment and treatment plan with the patient. The patient was provided an opportunity to ask questions and all were answered. The patient agreed with the plan and demonstrated an understanding of the instructions.   The patient was advised to call back or seek an in-person evaluation if the symptoms worsen or if the condition fails to improve as anticipated.  The above assessment and management plan was discussed with the patient. The patient verbalized understanding of and has agreed to the management plan. Patient is aware to call the clinic if symptoms persist or worsen. Patient is aware when to return to the clinic for a follow-up visit. Patient educated on when it is appropriate to go to the emergency department.     Mort Sawyers, MSN, APRN, FNP-C [facility]

## 2023-08-13 ENCOUNTER — Ambulatory Visit: Admitting: Primary Care

## 2023-08-13 ENCOUNTER — Encounter: Payer: Self-pay | Admitting: Primary Care

## 2023-08-13 VITALS — BP 110/74 | HR 71 | Temp 97.6°F | Ht 64.0 in | Wt 208.0 lb

## 2023-08-13 DIAGNOSIS — F32A Depression, unspecified: Secondary | ICD-10-CM

## 2023-08-13 DIAGNOSIS — F419 Anxiety disorder, unspecified: Secondary | ICD-10-CM

## 2023-08-13 DIAGNOSIS — Z0279 Encounter for issue of other medical certificate: Secondary | ICD-10-CM

## 2023-08-13 MED ORDER — SERTRALINE HCL 100 MG PO TABS
100.0000 mg | ORAL_TABLET | Freq: Every day | ORAL | 0 refills | Status: DC
Start: 2023-08-13 — End: 2023-11-05

## 2023-08-13 NOTE — Patient Instructions (Signed)
 Increase your dose of Zoloft  to 100 mg daily.  Start taking 75 mg (1/2 tablets) by mouth once daily x 1 week, then increase to 100 mg daily thereafter.  I sent a new prescription to your pharmacy.  Will complete your FMLA paperwork.  Continue to follow-up with your therapist.  It was a pleasure to see you today!
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
## 2023-08-13 NOTE — Assessment & Plan Note (Signed)
 Deteriorated.   Discussed options for treatment. Continue with therapy. Increase Zoloft  to 100 mg daily.  Agree to complete FMLA with start date of April 28 return to work date of May 12. She will continue to keep updated regarding her symptoms.

## 2023-08-16 ENCOUNTER — Telehealth: Payer: Self-pay | Admitting: Primary Care

## 2023-08-16 NOTE — Telephone Encounter (Signed)
 Copied from CRM [id number]. Topic: General - Other >> Aug 16, 2023  1:47 PM Rosamond Comes wrote: Reason for CRM: patient calling in asking if FMLA papers have been faxed to employer. FMLA started 08/16/23 and return to work  08/30/23  patient would like an update regarding this issue.    Patient phone [phone] ok to leave a detailed message.

## 2023-08-16 NOTE — Telephone Encounter (Signed)
 FMLA paperwork received completed. Copy has been sent to scan. Spoke with patient. She will be in to pick up the forms to take to her employer and will pick up a copy for her records.

## 2023-08-17 NOTE — Telephone Encounter (Signed)
 Patient came in and picked up paperwork

## 2023-10-08 DIAGNOSIS — Z1331 Encounter for screening for depression: Secondary | ICD-10-CM | POA: Diagnosis not present

## 2023-10-08 DIAGNOSIS — Z01411 Encounter for gynecological examination (general) (routine) with abnormal findings: Secondary | ICD-10-CM | POA: Diagnosis not present

## 2023-10-08 DIAGNOSIS — Z1321 Encounter for screening for nutritional disorder: Secondary | ICD-10-CM | POA: Diagnosis not present

## 2023-10-08 DIAGNOSIS — R5383 Other fatigue: Secondary | ICD-10-CM | POA: Diagnosis not present

## 2023-10-08 DIAGNOSIS — Z1329 Encounter for screening for other suspected endocrine disorder: Secondary | ICD-10-CM | POA: Diagnosis not present

## 2023-10-08 DIAGNOSIS — Z124 Encounter for screening for malignant neoplasm of cervix: Secondary | ICD-10-CM | POA: Diagnosis not present

## 2023-10-15 ENCOUNTER — Encounter: Payer: Self-pay | Admitting: Primary Care

## 2023-10-15 ENCOUNTER — Ambulatory Visit: Admitting: Primary Care

## 2023-10-15 VITALS — BP 138/82 | HR 56 | Temp 97.7°F | Ht 64.0 in | Wt 217.0 lb

## 2023-10-15 DIAGNOSIS — E538 Deficiency of other specified B group vitamins: Secondary | ICD-10-CM | POA: Diagnosis not present

## 2023-10-15 DIAGNOSIS — R7989 Other specified abnormal findings of blood chemistry: Secondary | ICD-10-CM | POA: Diagnosis not present

## 2023-10-15 MED ORDER — CYANOCOBALAMIN 1000 MCG/ML IJ SOLN: INTRAMUSCULAR | 0 refills | Status: DC

## 2023-10-15 MED ORDER — CYANOCOBALAMIN 1000 MCG/ML IJ SOLN
1000.0000 ug | Freq: Once | INTRAMUSCULAR | Status: AC
  Administered 2023-10-15: 1000 ug via INTRAMUSCULAR

## 2023-10-15 MED ORDER — SYRINGE/NEEDLE (DISP) 25G X 1" 1 ML MISC: 0 refills | Status: DC

## 2023-10-15 NOTE — Addendum Note (Signed)
 Addended by: NARCISO ANDREZ BROCKS on: 10/15/2023 10:32 AM   Modules accepted: Orders

## 2023-10-15 NOTE — Assessment & Plan Note (Addendum)
 Reviewed vitamin B12 level from care everywhere per OB/GYN.  Start once weekly B12 1000 mcg x 4 weeks, then every other week x 4 weeks, then once monthly thereafter. Repeat vitamin B12 labs in 3 months.  CBC and ferritin pending today.  She works with numerous RNs who can administer the B12 injections for her.  She looks comfortable with this plan.
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
## 2023-10-15 NOTE — Patient Instructions (Addendum)
 Inject 1 mL into the muscle once weekly x 4 weeks, then every other week x 4 weeks, then once monthly thereafter.  Schedule a lab appointment to return at 8 AM.  Schedule another lab appointment to return in 3.5 months for repeat B12 level.  It was a pleasure to see you today!

## 2023-10-15 NOTE — Assessment & Plan Note (Signed)
 Reviewed cortisol level from care everywhere per OB/GYN.  Will repeat cortisol level but discussed to return between the hours of 8 and 9 AM We will add ACTH.  Await results.

## 2023-10-15 NOTE — Addendum Note (Signed)
 Addended by: Chenel Wernli K on: 10/15/2023 10:23 AM   Modules accepted: Orders

## 2023-10-18 ENCOUNTER — Other Ambulatory Visit (INDEPENDENT_AMBULATORY_CARE_PROVIDER_SITE_OTHER)

## 2023-10-18 ENCOUNTER — Ambulatory Visit: Payer: Self-pay | Admitting: Family

## 2023-10-18 DIAGNOSIS — E538 Deficiency of other specified B group vitamins: Secondary | ICD-10-CM

## 2023-10-18 DIAGNOSIS — H44002 Unspecified purulent endophthalmitis, left eye: Secondary | ICD-10-CM | POA: Diagnosis not present

## 2023-10-18 DIAGNOSIS — R7989 Other specified abnormal findings of blood chemistry: Secondary | ICD-10-CM

## 2023-10-18 LAB — FERRITIN: Ferritin: 36.1 ng/mL (ref 10.0–291.0)

## 2023-10-18 LAB — CBC WITH DIFFERENTIAL/PLATELET
Basophils Absolute: 0.1 10*3/uL (ref 0.0–0.1)
Basophils Relative: 0.9 % (ref 0.0–3.0)
Eosinophils Absolute: 0.1 10*3/uL (ref 0.0–0.7)
Eosinophils Relative: 1.2 % (ref 0.0–5.0)
HCT: 39.8 % (ref 36.0–46.0)
Hemoglobin: 13.3 g/dL (ref 12.0–15.0)
Lymphocytes Relative: 27.1 % (ref 12.0–46.0)
Lymphs Abs: 2.1 10*3/uL (ref 0.7–4.0)
MCHC: 33.5 g/dL (ref 30.0–36.0)
MCV: 88.6 fl (ref 78.0–100.0)
Monocytes Absolute: 0.8 10*3/uL (ref 0.1–1.0)
Monocytes Relative: 9.7 % (ref 3.0–12.0)
Neutro Abs: 4.7 10*3/uL (ref 1.4–7.7)
Neutrophils Relative %: 61.1 % (ref 43.0–77.0)
Platelets: 233 10*3/uL (ref 150.0–400.0)
RBC: 4.49 Mil/uL (ref 3.87–5.11)
RDW: 12.6 % (ref 11.5–15.5)
WBC: 7.8 10*3/uL (ref 4.0–10.5)

## 2023-10-18 NOTE — Progress Notes (Signed)
 Not sure why I received these results but sending them on to you, maybe an older order

## 2023-10-19 ENCOUNTER — Other Ambulatory Visit: Payer: Self-pay | Admitting: Primary Care

## 2023-10-19 DIAGNOSIS — F419 Anxiety disorder, unspecified: Secondary | ICD-10-CM

## 2023-10-20 ENCOUNTER — Ambulatory Visit: Payer: Self-pay | Admitting: Primary Care

## 2023-10-21 LAB — CORTISOL-AM, BLOOD: Cortisol - AM: 11.1 ug/dL

## 2023-10-21 LAB — ACTH: C206 ACTH: 25 pg/mL (ref 6–50)

## 2023-10-30 DIAGNOSIS — M5416 Radiculopathy, lumbar region: Secondary | ICD-10-CM | POA: Diagnosis not present

## 2023-11-05 ENCOUNTER — Other Ambulatory Visit: Payer: Self-pay | Admitting: Primary Care

## 2023-11-05 DIAGNOSIS — F32A Depression, unspecified: Secondary | ICD-10-CM

## 2023-11-05 DIAGNOSIS — Z3046 Encounter for surveillance of implantable subdermal contraceptive: Secondary | ICD-10-CM | POA: Diagnosis not present

## 2023-11-05 DIAGNOSIS — Z3202 Encounter for pregnancy test, result negative: Secondary | ICD-10-CM | POA: Diagnosis not present

## 2023-11-15 DIAGNOSIS — D060 Carcinoma in situ of endocervix: Secondary | ICD-10-CM | POA: Diagnosis not present

## 2023-11-15 DIAGNOSIS — Z01812 Encounter for preprocedural laboratory examination: Secondary | ICD-10-CM | POA: Diagnosis not present

## 2023-11-15 DIAGNOSIS — N870 Mild cervical dysplasia: Secondary | ICD-10-CM | POA: Diagnosis not present

## 2023-11-15 DIAGNOSIS — R87618 Other abnormal cytological findings on specimens from cervix uteri: Secondary | ICD-10-CM | POA: Diagnosis not present

## 2023-11-15 DIAGNOSIS — Z7185 Encounter for immunization safety counseling: Secondary | ICD-10-CM | POA: Diagnosis not present

## 2023-11-29 ENCOUNTER — Encounter
Admission: RE | Admit: 2023-11-29 | Discharge: 2023-11-29 | Disposition: A | Discharge status: Home / Self Care | Source: Ambulatory Visit | Attending: Obstetrics and Gynecology | Admitting: Obstetrics and Gynecology

## 2023-11-29 ENCOUNTER — Other Ambulatory Visit: Payer: Self-pay

## 2023-11-29 DIAGNOSIS — D069 Carcinoma in situ of cervix, unspecified: Secondary | ICD-10-CM | POA: Diagnosis not present

## 2023-11-29 DIAGNOSIS — R030 Elevated blood-pressure reading, without diagnosis of hypertension: Secondary | ICD-10-CM | POA: Diagnosis not present

## 2023-11-29 DIAGNOSIS — Z23 Encounter for immunization: Secondary | ICD-10-CM | POA: Diagnosis not present

## 2023-11-29 DIAGNOSIS — Z01818 Encounter for other preprocedural examination: Secondary | ICD-10-CM

## 2023-11-29 DIAGNOSIS — Z7185 Encounter for immunization safety counseling: Secondary | ICD-10-CM | POA: Diagnosis not present

## 2023-11-29 HISTORY — DX: Deficiency of other specified B group vitamins: E53.8

## 2023-11-29 HISTORY — DX: Hyperlipidemia, unspecified: E78.5

## 2023-11-29 HISTORY — DX: Other seasonal allergic rhinitis: J30.2

## 2023-11-29 HISTORY — DX: Malignant neoplasm of cervix uteri, unspecified: C53.9

## 2023-11-29 HISTORY — DX: Periorbital cellulitis: L03.213

## 2023-11-29 NOTE — Patient Instructions (Addendum)
 Your procedure is scheduled on:12-03-23 Friday Report to the Registration Desk on the 1st floor of the [facility].Then proceed to the 2nd floor Surgery Desk To find out your arrival time, please call [phone] between 1PM - 3PM on:12-02-23 Thursday If your arrival time is 6:00 am, do not arrive before that time as the Medical Mall entrance doors do not open until 6:00 am.  REMEMBER: Instructions that are not followed completely may result in serious medical risk, up to and including death; or upon the discretion of your surgeon and anesthesiologist your surgery may need to be rescheduled.  Do not eat food after midnight the night before surgery.  No gum chewing or hard candies.  You may however, drink CLEAR liquids up to 2 hours before you are scheduled to arrive for your surgery. Do not drink anything within 2 hours of your scheduled arrival time.  Clear liquids include: - water  - apple juice without pulp - gatorade (not RED colors) - black coffee or tea (Do NOT add milk or creamers to the coffee or tea) Do NOT drink anything that is not on this list.  In addition, your doctor has ordered for you to drink the provided:  Ensure Pre-Surgery Clear Carbohydrate Drink  Drinking this carbohydrate drink up to two hours before surgery helps to reduce insulin resistance and improve patient outcomes. Please complete drinking 2 hours before scheduled arrival time.  One week prior to surgery:Stop NOW (11-29-23) Stop Anti-inflammatories (NSAIDS) such as Advil , Aleve, Ibuprofen , Motrin , Naproxen, Naprosyn and Aspirin based products such as Excedrin, Goody's Powder, BC Powder. Stop ANY OVER THE COUNTER supplements until after surgery (Vitamin D3, Probiotic)  You may however, continue to take Tylenol  if needed for pain up until the day of surgery  Continue taking all of your other prescription medications up until the day of surgery.  Do NOT take any medication the day of surgery  No Alcohol  for 24 hours before or after surgery.  No Smoking including e-cigarettes for 24 hours before surgery.  No chewable tobacco products for at least 6 hours before surgery.  No nicotine patches on the day of surgery.  Do not use any recreational drugs for at least a week (preferably 2 weeks) before your surgery.  Please be advised that the combination of cocaine and anesthesia may have negative outcomes, up to and including death. If you test positive for cocaine, your surgery will be cancelled.  On the morning of surgery brush your teeth with toothpaste and water, you may rinse your mouth with mouthwash if you wish. Do not swallow any toothpaste or mouthwash.  Do not wear jewelry, make-up, hairpins, clips or nail polish.  For welded (permanent) jewelry: bracelets, anklets, waist bands, etc.  Please have this removed prior to surgery.  If it is not removed, there is a chance that hospital personnel will need to cut it off on the day of surgery.  Do not wear lotions, powders, or perfumes.   Do not shave body hair from the neck down 48 hours before surgery.  Contact lenses, hearing aids and dentures may not be worn into surgery.  Do not bring valuables to the hospital. [facility] is not responsible for any missing/lost belongings or valuables.   Notify your doctor if there is any change in your medical condition (cold, fever, infection).  Wear comfortable clothing (specific to your surgery type) to the hospital.  After surgery, you can help prevent lung complications by doing breathing exercises.  Take deep breaths and cough every 1-2 hours. Your doctor may order a device called an Incentive Spirometer to help you take deep breaths. When coughing or sneezing, hold a pillow firmly against your incision with both hands. This is called "splinting." Doing this helps protect your incision. It also decreases belly discomfort.  If you are being admitted to the hospital overnight, leave your  suitcase in the car. After surgery it may be brought to your room.  In case of increased patient census, it may be necessary for you, the patient, to continue your postoperative care in the Same Day Surgery department.  If you are being discharged the day of surgery, you will not be allowed to drive home. You will need a responsible individual to drive you home and stay with you for 24 hours after surgery.   If you are taking public transportation, you will need to have a responsible individual with you.  Please call the Pre-admissions Testing Dept. at [phone] if you have any questions about these instructions.  Surgery Visitation Policy:  Patients having surgery or a procedure may have two visitors.  Children under the age of 15 must have an adult with them who is not the patient.   Merchandiser, retail to address health-related social needs:  [email or web address]
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
## 2023-12-01 ENCOUNTER — Encounter
Admission: RE | Admit: 2023-12-01 | Discharge: 2023-12-01 | Disposition: A | Discharge status: Home / Self Care | Source: Ambulatory Visit | Attending: Obstetrics and Gynecology | Admitting: Obstetrics and Gynecology

## 2023-12-01 DIAGNOSIS — Z01812 Encounter for preprocedural laboratory examination: Secondary | ICD-10-CM | POA: Insufficient documentation

## 2023-12-01 DIAGNOSIS — C530 Malignant neoplasm of endocervix: Secondary | ICD-10-CM | POA: Diagnosis not present

## 2023-12-01 DIAGNOSIS — D099 Carcinoma in situ, unspecified: Secondary | ICD-10-CM | POA: Diagnosis not present

## 2023-12-01 DIAGNOSIS — K219 Gastro-esophageal reflux disease without esophagitis: Secondary | ICD-10-CM | POA: Diagnosis not present

## 2023-12-01 DIAGNOSIS — Z01818 Encounter for other preprocedural examination: Secondary | ICD-10-CM

## 2023-12-01 DIAGNOSIS — E785 Hyperlipidemia, unspecified: Secondary | ICD-10-CM | POA: Diagnosis not present

## 2023-12-01 LAB — CBC
HCT: 38.2 % (ref 36.0–46.0)
Hemoglobin: 12.9 g/dL (ref 12.0–15.0)
MCH: 30.4 pg (ref 26.0–34.0)
MCHC: 33.8 g/dL (ref 30.0–36.0)
MCV: 90.1 fL (ref 80.0–100.0)
Platelets: 266 K/uL (ref 150–400)
RBC: 4.24 MIL/uL (ref 3.87–5.11)
RDW: 12 % (ref 11.5–15.5)
WBC: 6.1 K/uL (ref 4.0–10.5)
nRBC: 0 % (ref 0.0–0.2)

## 2023-12-01 LAB — TYPE AND SCREEN
ABO/RH(D): A POS
Antibody Screen: NEGATIVE

## 2023-12-02 MED ORDER — CHLORHEXIDINE GLUCONATE 0.12 % MT SOLN
15.0000 mL | Freq: Once | OROMUCOSAL | Status: AC
  Administered 2023-12-03: 15 mL via OROMUCOSAL

## 2023-12-02 MED ORDER — POVIDONE-IODINE 10 % EX SWAB: 2.0000 | Freq: Once | CUTANEOUS | Status: DC

## 2023-12-02 MED ORDER — LACTATED RINGERS IV SOLN: INTRAVENOUS | Status: DC

## 2023-12-02 MED ORDER — ORAL CARE MOUTH RINSE: 15.0000 mL | Freq: Once | OROMUCOSAL | Status: AC

## 2023-12-02 MED ORDER — SODIUM CHLORIDE 0.9 % IV SOLN: INTRAVENOUS | Status: DC

## 2023-12-02 MED ORDER — ACETAMINOPHEN 500 MG PO TABS
1000.0000 mg | ORAL_TABLET | ORAL | Status: AC
  Administered 2023-12-03: 1000 mg via ORAL

## 2023-12-03 ENCOUNTER — Other Ambulatory Visit: Payer: Self-pay

## 2023-12-03 ENCOUNTER — Ambulatory Visit: Payer: Self-pay | Admitting: Urgent Care

## 2023-12-03 ENCOUNTER — Ambulatory Visit
Admission: RE | Admit: 2023-12-03 | Discharge: 2023-12-03 | Disposition: A | Discharge status: Home / Self Care | Attending: Obstetrics and Gynecology | Admitting: Obstetrics and Gynecology

## 2023-12-03 ENCOUNTER — Encounter
Admission: RE | Disposition: A | Discharge status: Home / Self Care | Payer: Self-pay | Source: Home / Self Care | Attending: Obstetrics and Gynecology

## 2023-12-03 ENCOUNTER — Encounter: Payer: Self-pay | Admitting: Obstetrics and Gynecology

## 2023-12-03 ENCOUNTER — Ambulatory Visit: Admitting: Registered Nurse

## 2023-12-03 DIAGNOSIS — K219 Gastro-esophageal reflux disease without esophagitis: Secondary | ICD-10-CM | POA: Diagnosis not present

## 2023-12-03 DIAGNOSIS — E785 Hyperlipidemia, unspecified: Secondary | ICD-10-CM | POA: Insufficient documentation

## 2023-12-03 DIAGNOSIS — D069 Carcinoma in situ of cervix, unspecified: Secondary | ICD-10-CM | POA: Diagnosis not present

## 2023-12-03 DIAGNOSIS — C530 Malignant neoplasm of endocervix: Secondary | ICD-10-CM | POA: Diagnosis not present

## 2023-12-03 DIAGNOSIS — Z01818 Encounter for other preprocedural examination: Secondary | ICD-10-CM

## 2023-12-03 DIAGNOSIS — D099 Carcinoma in situ, unspecified: Secondary | ICD-10-CM | POA: Diagnosis not present

## 2023-12-03 DIAGNOSIS — D060 Carcinoma in situ of endocervix: Secondary | ICD-10-CM | POA: Diagnosis not present

## 2023-12-03 HISTORY — PX: CERVICAL CONIZATION W/BX: SHX1330

## 2023-12-03 LAB — POCT PREGNANCY, URINE: Preg Test, Ur: NEGATIVE

## 2023-12-03 SURGERY — CONE BIOPSY, CERVIX
Anesthesia: General | Site: Cervix

## 2023-12-03 MED ORDER — FENTANYL CITRATE (PF) 100 MCG/2ML IJ SOLN
INTRAMUSCULAR | Status: AC
  Filled 2023-12-03: qty 2

## 2023-12-03 MED ORDER — LIDOCAINE-EPINEPHRINE 1 %-1:100000 IJ SOLN
INTRAMUSCULAR | Status: AC
  Filled 2023-12-03: qty 1

## 2023-12-03 MED ORDER — DEXMEDETOMIDINE HCL IN NACL 80 MCG/20ML IV SOLN
INTRAVENOUS | Status: DC | PRN
  Administered 2023-12-03: 5 ug via INTRAVENOUS

## 2023-12-03 MED ORDER — OXYCODONE HCL 5 MG PO TABS
ORAL_TABLET | ORAL | Status: AC
Start: 2023-12-03 — End: 2023-12-03
  Filled 2023-12-03: qty 1

## 2023-12-03 MED ORDER — LIDOCAINE-EPINEPHRINE 1 %-1:100000 IJ SOLN
INTRAMUSCULAR | Status: DC | PRN
  Administered 2023-12-03: 30 mL

## 2023-12-03 MED ORDER — PHENYLEPHRINE 80 MCG/ML (10ML) SYRINGE FOR IV PUSH (FOR BLOOD PRESSURE SUPPORT)
PREFILLED_SYRINGE | INTRAVENOUS | Status: DC | PRN
  Administered 2023-12-03 (×3): 80 ug via INTRAVENOUS

## 2023-12-03 MED ORDER — CHLORHEXIDINE GLUCONATE 0.12 % MT SOLN
OROMUCOSAL | Status: AC
  Filled 2023-12-03: qty 15

## 2023-12-03 MED ORDER — HEMOSTATIC AGENTS (NO CHARGE) OPTIME
TOPICAL | Status: DC | PRN
  Administered 2023-12-03: 1 via TOPICAL

## 2023-12-03 MED ORDER — ACETAMINOPHEN 500 MG PO TABS
ORAL_TABLET | ORAL | Status: AC
  Filled 2023-12-03: qty 2

## 2023-12-03 MED ORDER — DEXAMETHASONE SODIUM PHOSPHATE 10 MG/ML IJ SOLN
INTRAMUSCULAR | Status: AC
  Filled 2023-12-03: qty 1

## 2023-12-03 MED ORDER — MIDAZOLAM HCL 2 MG/2ML IJ SOLN
INTRAMUSCULAR | Status: DC | PRN
  Administered 2023-12-03: 2 mg via INTRAVENOUS

## 2023-12-03 MED ORDER — LIDOCAINE HCL (CARDIAC) PF 100 MG/5ML IV SOSY
PREFILLED_SYRINGE | INTRAVENOUS | Status: DC | PRN
  Administered 2023-12-03: 60 mg via INTRAVENOUS

## 2023-12-03 MED ORDER — LACTATED RINGERS IV SOLN: INTRAVENOUS | Status: DC | PRN

## 2023-12-03 MED ORDER — KETOROLAC TROMETHAMINE 30 MG/ML IJ SOLN
INTRAMUSCULAR | Status: DC | PRN
Start: 2023-12-03 — End: 2023-12-03
  Administered 2023-12-03: 30 mg via INTRAVENOUS

## 2023-12-03 MED ORDER — DEXAMETHASONE SODIUM PHOSPHATE 10 MG/ML IJ SOLN
INTRAMUSCULAR | Status: DC | PRN
  Administered 2023-12-03: 10 mg via INTRAVENOUS

## 2023-12-03 MED ORDER — ONDANSETRON HCL 4 MG/2ML IJ SOLN
INTRAMUSCULAR | Status: AC
  Filled 2023-12-03: qty 2

## 2023-12-03 MED ORDER — ROCURONIUM BROMIDE 100 MG/10ML IV SOLN
INTRAVENOUS | Status: DC | PRN
  Administered 2023-12-03: 50 mg via INTRAVENOUS

## 2023-12-03 MED ORDER — PROPOFOL 10 MG/ML IV BOLUS
INTRAVENOUS | Status: DC | PRN
  Administered 2023-12-03: 200 mg via INTRAVENOUS

## 2023-12-03 MED ORDER — 0.9 % SODIUM CHLORIDE (POUR BTL) OPTIME
TOPICAL | Status: DC | PRN
  Administered 2023-12-03: 500 mL

## 2023-12-03 MED ORDER — OXYCODONE HCL 5 MG PO TABS
5.0000 mg | ORAL_TABLET | Freq: Once | ORAL | Status: AC
  Administered 2023-12-03: 5 mg via ORAL

## 2023-12-03 MED ORDER — MONSELS FERRIC SUBSULFATE EX SOLN
CUTANEOUS | Status: DC | PRN
  Administered 2023-12-03: 1 via TOPICAL

## 2023-12-03 MED ORDER — LIDOCAINE HCL (PF) 2 % IJ SOLN
INTRAMUSCULAR | Status: AC
Start: 2023-12-03 — End: 2023-12-03
  Filled 2023-12-03: qty 5

## 2023-12-03 MED ORDER — SUGAMMADEX SODIUM 200 MG/2ML IV SOLN
INTRAVENOUS | Status: DC | PRN
  Administered 2023-12-03: 100 mg via INTRAVENOUS

## 2023-12-03 MED ORDER — DROPERIDOL 2.5 MG/ML IJ SOLN: 0.6250 mg | Freq: Once | INTRAMUSCULAR | Status: DC | PRN

## 2023-12-03 MED ORDER — ROCURONIUM BROMIDE 10 MG/ML (PF) SYRINGE
PREFILLED_SYRINGE | INTRAVENOUS | Status: AC
  Filled 2023-12-03: qty 10

## 2023-12-03 MED ORDER — FENTANYL CITRATE (PF) 100 MCG/2ML IJ SOLN
25.0000 ug | INTRAMUSCULAR | Status: DC | PRN
  Administered 2023-12-03 (×3): 25 ug via INTRAVENOUS

## 2023-12-03 MED ORDER — EPHEDRINE 5 MG/ML INJ
INTRAVENOUS | Status: AC
  Filled 2023-12-03: qty 5

## 2023-12-03 MED ORDER — MONSELS FERRIC SUBSULFATE EX SOLN
CUTANEOUS | Status: AC
  Filled 2023-12-03: qty 8

## 2023-12-03 MED ORDER — EPHEDRINE SULFATE-NACL 50-0.9 MG/10ML-% IV SOSY
PREFILLED_SYRINGE | INTRAVENOUS | Status: DC | PRN
  Administered 2023-12-03: 5 mg via INTRAVENOUS
  Administered 2023-12-03: 10 mg via INTRAVENOUS

## 2023-12-03 MED ORDER — PROPOFOL 10 MG/ML IV BOLUS
INTRAVENOUS | Status: AC
  Filled 2023-12-03: qty 20

## 2023-12-03 MED ORDER — FENTANYL CITRATE (PF) 100 MCG/2ML IJ SOLN
INTRAMUSCULAR | Status: DC | PRN
  Administered 2023-12-03 (×2): 50 ug via INTRAVENOUS

## 2023-12-03 MED ORDER — ONDANSETRON HCL 4 MG/2ML IJ SOLN
INTRAMUSCULAR | Status: DC | PRN
  Administered 2023-12-03: 4 mg via INTRAVENOUS

## 2023-12-03 MED ORDER — IODINE STRONG (LUGOLS) 5 % PO SOLN
ORAL | Status: AC
  Filled 2023-12-03: qty 1

## 2023-12-03 MED ORDER — IODINE STRONG (LUGOLS) 5 % PO SOLN
ORAL | Status: DC | PRN
  Administered 2023-12-03: 5 mL

## 2023-12-03 MED ORDER — PHENYLEPHRINE 80 MCG/ML (10ML) SYRINGE FOR IV PUSH (FOR BLOOD PRESSURE SUPPORT)
PREFILLED_SYRINGE | INTRAVENOUS | Status: AC
Start: 2023-12-03 — End: 2023-12-03
  Filled 2023-12-03: qty 10

## 2023-12-03 MED ORDER — MIDAZOLAM HCL 2 MG/2ML IJ SOLN
INTRAMUSCULAR | Status: AC
  Filled 2023-12-03: qty 2

## 2023-12-03 SURGICAL SUPPLY — 33 items
APPLICATOR COTTON TIP 6 STRL (MISCELLANEOUS) ×1 IMPLANT
APPLICATOR SWAB PROCTO LG 16IN (MISCELLANEOUS) ×1 IMPLANT
BLADE SURG SZ11 CARB STEEL (BLADE) IMPLANT
CATH ROBINSON RED A/P 16FR (CATHETERS) ×1 IMPLANT
CNTNR URN SCR LID CUP LEK RST (MISCELLANEOUS) ×1 IMPLANT
DRAPE PERI LITHO V/GYN (MISCELLANEOUS) ×1 IMPLANT
DRESSING SURGICEL FIBRLLR 1X2 (HEMOSTASIS) IMPLANT
DRSG TELFA 3X4 N-ADH STERILE (GAUZE/BANDAGES/DRESSINGS) IMPLANT
ELECTRODE LEEP BALL 5MM 12CM (MISCELLANEOUS) ×1 IMPLANT
ELECTRODE LOOP 1.0X1.0CM R1010 (MISCELLANEOUS) ×1 IMPLANT
ELECTRODE REM PT RTRN 9FT ADLT (ELECTROSURGICAL) ×1 IMPLANT
GAUZE 4X4 16PLY ~~LOC~~+RFID DBL (SPONGE) ×1 IMPLANT
GLOVE SURG SYN 6.5 PF PI (GLOVE) ×2 IMPLANT
GOWN STRL REUS W/ TWL LRG LVL3 (GOWN DISPOSABLE) ×2 IMPLANT
KIT TURNOVER CYSTO (KITS) ×1 IMPLANT
LABEL OR SOLS (LABEL) IMPLANT
MANIFOLD NEPTUNE II (INSTRUMENTS) ×1 IMPLANT
NDL HYPO 30X.5 LL (NEEDLE) IMPLANT
NDL SPNL 22GX3.5 QUINCKE BK (NEEDLE) ×1 IMPLANT
NEEDLE HYPO 30X.5 LL (NEEDLE) IMPLANT
NEEDLE SPNL 22GX3.5 QUINCKE BK (NEEDLE) ×1 IMPLANT
NS IRRIG 500ML POUR BTL (IV SOLUTION) IMPLANT
PACK BASIN MINOR ARMC (MISCELLANEOUS) ×1 IMPLANT
PAD OB MATERNITY 11 LF (PERSONAL CARE ITEMS) ×1 IMPLANT
PAD PREP OB/GYN DISP 24X41 (PERSONAL CARE ITEMS) ×1 IMPLANT
SCOPETTES 8 STERILE (MISCELLANEOUS) IMPLANT
SCRUB CHG 4% DYNA-HEX 4OZ (MISCELLANEOUS) ×1 IMPLANT
STRAW SMOKE EVAC LEEP 6150 NON (MISCELLANEOUS) ×1 IMPLANT
SURGILUBE 2OZ TUBE FLIPTOP (MISCELLANEOUS) ×1 IMPLANT
SUT SILK 2 0 SH (SUTURE) ×1 IMPLANT
SUT VIC AB 0 SH 27 (SUTURE) ×1 IMPLANT
SYR CONTROL 10ML LL (SYRINGE) ×1 IMPLANT
TRAP FLUID SMOKE EVACUATOR (MISCELLANEOUS) ×1 IMPLANT

## 2023-12-03 NOTE — Progress Notes (Signed)
 Work noted provided for husband Loghan Subia through Tuesday August 19th, 2025. As requested.

## 2023-12-03 NOTE — Anesthesia Procedure Notes (Signed)
 Procedure Name: Intubation Date/Time: 12/03/2023 7:48 AM  Performed by: Lorrene Camelia LABOR, CRNAPre-anesthesia Checklist: Patient identified, Patient being monitored, Timeout performed, Emergency Drugs available and Suction available Patient Re-evaluated:Patient Re-evaluated prior to induction Oxygen Delivery Method: Circle system utilized Preoxygenation: Pre-oxygenation with 100% oxygen Induction Type: IV induction Ventilation: Mask ventilation without difficulty Laryngoscope Size: 3 and McGrath Grade View: Grade II Tube type: Oral Tube size: 7.0 mm Number of attempts: 1 Airway Equipment and Method: Stylet Placement Confirmation: ETT inserted through vocal cords under direct vision, positive ETCO2 and breath sounds checked- equal and bilateral Secured at: 21 cm Tube secured with: Tape Dental Injury: Teeth and Oropharynx as per pre-operative assessment

## 2023-12-03 NOTE — Transfer of Care (Signed)
 Immediate Anesthesia Transfer of Care Note  Patient: Loretta Burton  Procedure(s) Performed: CONE BIOPSY, CERVIX (Cervix)  Patient Location: PACU  Anesthesia Type:General  Level of Consciousness: drowsy  Airway & Oxygen Therapy: Patient Spontanous Breathing and Patient connected to face mask oxygen  Post-op Assessment: Report given to RN and Post -op Vital signs reviewed and stable  Post vital signs: Reviewed and stable  Last Vitals:  Vitals Value Taken Time  BP 136/61 12/03/23 09:00  Temp    Pulse 100 12/03/23 09:01  Resp 25 12/03/23 09:01  SpO2 100 % 12/03/23 09:01  Vitals shown include unfiled device data.  Last Pain:  Vitals:   12/03/23 0642  TempSrc: Temporal  PainSc: 3       Patients Stated Pain Goal: 0 (12/03/23 9357)  Complications: No notable events documented.

## 2023-12-03 NOTE — Anesthesia Preprocedure Evaluation (Signed)
 Anesthesia Evaluation  Patient identified by MRN, date of birth, ID band Patient awake    Reviewed: Allergy & Precautions, H&P , NPO status , Patient's Chart, lab work & pertinent test results, reviewed documented beta blocker date and time   History of Anesthesia Complications Negative for: history of anesthetic complications  Airway Mallampati: IV  TM Distance: >3 FB Neck ROM: full  Mouth opening: Limited Mouth Opening  Dental  (+) Dental Advidsory Given, Teeth Intact   Pulmonary neg pulmonary ROS   Pulmonary exam normal breath sounds clear to auscultation       Cardiovascular Exercise Tolerance: Good negative cardio ROS Normal cardiovascular exam Rhythm:regular Rate:Normal     Neuro/Psych  PSYCHIATRIC DISORDERS Anxiety Depression    negative neurological ROS     GI/Hepatic Neg liver ROS,GERD  ,,  Endo/Other  negative endocrine ROS    Renal/GU negative Renal ROS  negative genitourinary   Musculoskeletal   Abdominal   Peds  Hematology negative hematology ROS (+)   Anesthesia Other Findings Past Medical History: 09/01/2022: Abscess of nose No date: Anemia No date: Cervical adenocarcinoma (HCC) No date: Cholelithiasis 08/12/2017: Decreased fetus movements affecting management of mother  in third trimester No date: Depression 12/24/2016: Depression affecting pregnancy No date: GERD (gastroesophageal reflux disease) No date: Hyperlipidemia No date: Insomnia 12/24/2016: Obesity affecting pregnancy No date: Panic attack No date: Periorbital cellulitis of left eye No date: Seasonal allergies 12/24/2016: Supervision of high-risk pregnancy No date: Vitamin B12 deficiency   Reproductive/Obstetrics negative OB ROS                              Anesthesia Physical Anesthesia Plan  ASA: 2  Anesthesia Plan: General   Post-op Pain Management:    Induction: Intravenous  PONV Risk  Score and Plan: 3 and Ondansetron , Dexamethasone , Midazolam  and Treatment may vary due to age or medical condition  Airway Management Planned: Oral ETT  Additional Equipment:   Intra-op Plan:   Post-operative Plan: Extubation in OR  Informed Consent: I have reviewed the patients History and Physical, chart, labs and discussed the procedure including the risks, benefits and alternatives for the proposed anesthesia with the patient or authorized representative who has indicated his/her understanding and acceptance.     Dental Advisory Given  Plan Discussed with: Anesthesiologist, CRNA and Surgeon  Anesthesia Plan Comments:         Anesthesia Quick Evaluation

## 2023-12-03 NOTE — Interval H&P Note (Signed)
 History and Physical Interval Note:  12/03/2023 7:35 AM  Loretta Burton  has presented today for surgery, with the diagnosis of cervical adenocarcinoma in situ.  The various methods of treatment have been discussed with the patient and family. After consideration of risks, benefits and other options for treatment, the patient has consented to  Procedure(s) with comments: CONE BIOPSY, CERVIX (N/A) - COLD KNIFE CONIZATION , ENDOCERVICAL CURETTAGE as a surgical intervention.  The patient's history has been reviewed, patient examined, no change in status, stable for surgery.  I have reviewed the patient's chart and labs.  Questions were answered to the patient's satisfaction.     Corion Sherrod V Chastity Noland

## 2023-12-03 NOTE — Op Note (Addendum)
 Operative Report    Patient Name: EMERA BUSSIE MRN: [id number] Date: 12/03/2023   Preoperative diagnosis: Cervical adenocarcinoma in situ Postoperative diagnosis: Same   Procedure: Cold knife conization, endocervical curettage   Anesthesia: General   Surgeon: Beverli Dinsmore, MD   Findings: Vaginal mucosa without lesions. Cervix without visible lesions with multiparous os. Lugol's revealed abnormal area at transformation zone at 6 o'clock. The cone specimen was removed in three pieces due to the nature of the tissue- the upper portion was marked with suture at the 12 o'clock position, the lower right portion (3-6 o'clock) was marked with a short suture, and the lower left portion (6-9 o'clock) was not marked. Picture in media. The tissue at the 6-9 o'clock portion was very hard and difficult to cut through although visually normal.    Estimated blood loss in the OR: 10cc Fluids: 700cc Urine output: 30cc   Complications: None               Procedure:  After informed consent was obtained, the patient was brought to the operating room where satisfactory general anesthesia was induced.  The patient was placed in the Yellofin stirrups and prepped and draped in the usual sterile fashion. Timeout was completed. Bladder was drained with an in and out catheter. A weighted speculum and a retractor were placed in the vagina. The cervix was grasped with a tenaculum. Two stay sutures were placed with 0-vicryl at the 3 and 9 o'clock positions on the cervix.  A paracervical block was performed with 10cc of 1% lidocaine  with epinephrine  injected into the vaginal fornices at 4 and 8 o'clock. An additional 10 cc was injected into the body of at the cervix at 2, 4, 8, and 10 o'clock. Lugol's was painted on the cervix. Findings listed above. A straight 11-blade scalpel was used to excise the specimen, including the entire transformation zone and approximately 1cm of the endocervical canal. The cone  specimen was removed in three pieces- the upper portion was marked with suture at the 12 o'clock position, the lower right portion (3-6 o'clock) was marked with a short suture, and the lower left portion (6-9 o'clock) was not marked. Picture in media.  Endocervical curettage was then performed.    The excised base was cauterized with the Bovie rollerball, and hemostasis was confirmed. Monsel's solution was placed over the base along with a sheet of Surgicel Fibrillar.The two cervical stay sutures were tied together. Hemostasis was again confirmed. The weighted speculum and retractor were removed. She tolerated the procedure well. Sponge, lap, and needle counts correct X2. Anesthesia was reversed.  The patient was extubated and taken to the PACU in stable condition.     Beverli LULLA Dinsmore, MD 12/03/2023 7:37 AM

## 2023-12-06 ENCOUNTER — Encounter: Payer: Self-pay | Admitting: Obstetrics and Gynecology

## 2023-12-06 LAB — SURGICAL PATHOLOGY

## 2023-12-06 NOTE — Anesthesia Postprocedure Evaluation (Signed)
 Anesthesia Post Note  Patient: Lucie SAUNDERS Radilla  Procedure(s) Performed: CONE BIOPSY, CERVIX (Cervix)  Patient location during evaluation: PACU Anesthesia Type: General Level of consciousness: awake and alert Pain management: pain level controlled Vital Signs Assessment: post-procedure vital signs reviewed and stable Respiratory status: spontaneous breathing, nonlabored ventilation, respiratory function stable and patient connected to nasal cannula oxygen Cardiovascular status: blood pressure returned to baseline and stable Postop Assessment: no apparent nausea or vomiting Anesthetic complications: no   No notable events documented.   Last Vitals:  Vitals:   12/03/23 0945 12/03/23 1003  BP: 126/61 (!) 124/54  Pulse: 94 91  Resp: 18 16  Temp:  36.7 C  SpO2: 96% 96%    Last Pain:  Vitals:   12/04/23 1227  TempSrc:   PainSc: 5                  Prentice Murphy

## 2023-12-09 ENCOUNTER — Ambulatory Visit: Payer: Self-pay | Admitting: Obstetrics and Gynecology

## 2023-12-31 ENCOUNTER — Ambulatory Visit: Admitting: Primary Care

## 2023-12-31 ENCOUNTER — Ambulatory Visit: Payer: Self-pay | Admitting: Primary Care

## 2023-12-31 ENCOUNTER — Encounter: Payer: Self-pay | Admitting: Primary Care

## 2023-12-31 VITALS — BP 118/62 | HR 75 | Temp 97.5°F | Ht 64.0 in | Wt 226.0 lb

## 2023-12-31 DIAGNOSIS — E538 Deficiency of other specified B group vitamins: Secondary | ICD-10-CM

## 2023-12-31 DIAGNOSIS — F419 Anxiety disorder, unspecified: Secondary | ICD-10-CM

## 2023-12-31 DIAGNOSIS — Z23 Encounter for immunization: Secondary | ICD-10-CM | POA: Diagnosis not present

## 2023-12-31 DIAGNOSIS — G4700 Insomnia, unspecified: Secondary | ICD-10-CM

## 2023-12-31 DIAGNOSIS — F32A Depression, unspecified: Secondary | ICD-10-CM

## 2023-12-31 DIAGNOSIS — E785 Hyperlipidemia, unspecified: Secondary | ICD-10-CM

## 2023-12-31 DIAGNOSIS — Z0000 Encounter for general adult medical examination without abnormal findings: Secondary | ICD-10-CM

## 2023-12-31 LAB — COMPREHENSIVE METABOLIC PANEL WITH GFR
ALT: 15 U/L (ref 0–35)
AST: 12 U/L (ref 0–37)
Albumin: 4.6 g/dL (ref 3.5–5.2)
Alkaline Phosphatase: 55 U/L (ref 39–117)
BUN: 12 mg/dL (ref 6–23)
CO2: 28 meq/L (ref 19–32)
Calcium: 9.7 mg/dL (ref 8.4–10.5)
Chloride: 105 meq/L (ref 96–112)
Creatinine, Ser: 0.63 mg/dL (ref 0.40–1.20)
GFR: 113.92 mL/min (ref >60.00)
Glucose, Bld: 97 mg/dL (ref 70–99)
Potassium: 3.9 meq/L (ref 3.5–5.1)
Sodium: 141 meq/L (ref 135–145)
Total Bilirubin: 0.3 mg/dL (ref 0.2–1.2)
Total Protein: 7.2 g/dL (ref 6.0–8.3)

## 2023-12-31 LAB — LIPID PANEL
Cholesterol: 209 mg/dL — ABNORMAL HIGH (ref 0–200)
HDL: 42.3 mg/dL (ref >39.00)
LDL Cholesterol: 136 mg/dL — ABNORMAL HIGH (ref 0–99)
NonHDL: 166.42
Total CHOL/HDL Ratio: 5
Triglycerides: 153 mg/dL — ABNORMAL HIGH (ref 0.0–149.0)
VLDL: 30.6 mg/dL (ref 0.0–40.0)

## 2023-12-31 LAB — VITAMIN B12: Vitamin B-12: 373 pg/mL (ref 211–911)

## 2023-12-31 MED ORDER — HYDROXYZINE HCL 10 MG PO TABS: 10.0000 mg | ORAL_TABLET | Freq: Every evening | ORAL | 0 refills | Status: DC | PRN

## 2023-12-31 NOTE — Assessment & Plan Note (Addendum)
 Controlled overall.  Continue Zoloft  100 mg daily. Add hydroxyzine  10 mg at bedtime as needed.

## 2023-12-31 NOTE — Assessment & Plan Note (Signed)
 Continue B 12 injections monthly for now. May need to increase frequency.   Repeat B12 pending.

## 2023-12-31 NOTE — Progress Notes (Signed)
 Subjective:    Patient ID: Loretta Burton, female    DOB: [birth year], 38 y.o.   MRN: [id number]  Loretta Burton is a very pleasant 38 y.o. female who presents today for complete physical and follow up of chronic conditions.  She would also like to discuss sleep disturbance. She is requesting a medication to take as needed for sleep. She will be undergoing a second surgery per GYN soon and is feeling anxious. She hardly slept the night before her first surgery.   Immunizations: -Tetanus: Completed in 2019 -Influenza: Influenza vaccine provided today.  HPV: Received 1 vaccine in August 2025  Diet: Fair diet.  Exercise: No regular exercise.  Eye exam: Completes annually  Dental exam: Completes semi-annually    Pap Smear: Completed per GYN  BP Readings from Last 3 Encounters:  12/31/23 118/62  12/03/23 (!) 124/54  10/15/23 138/82       Review of Systems  Constitutional:  Negative for unexpected weight change.  HENT:  Negative for rhinorrhea.   Respiratory:  Negative for cough and shortness of breath.   Cardiovascular:  Negative for chest pain.  Gastrointestinal:  Negative for constipation and diarrhea.  Genitourinary:  Negative for difficulty urinating and menstrual problem.  Musculoskeletal:  Negative for arthralgias and myalgias.  Skin:  Negative for rash.  Allergic/Immunologic: Negative for environmental allergies.  Neurological:  Negative for dizziness, numbness and headaches.  Psychiatric/Behavioral:  The patient is not nervous/anxious.          Past Medical History:  Diagnosis Date   Abscess of nose 09/01/2022   Anemia    Cervical adenocarcinoma (HCC)    Cholelithiasis    Decreased fetus movements affecting management of mother in third trimester 08/12/2017   Depression    Depression affecting pregnancy 12/24/2016   GERD (gastroesophageal reflux disease)    Hyperlipidemia    Insomnia    Obesity affecting pregnancy 12/24/2016   Panic attack     Periorbital cellulitis of left eye    Seasonal allergies    Supervision of high-risk pregnancy 12/24/2016   Vitamin B12 deficiency     Social History   Socioeconomic History   Marital status: Married    Spouse name: Not on file   Number of children: Not on file   Years of education: Not on file   Highest education level: Not on file  Occupational History   Not on file  Tobacco Use   Smoking status: Never   Smokeless tobacco: Never  Vaping Use   Vaping status: Never Used  Substance and Sexual Activity   Alcohol use: No   Drug use: No   Sexual activity: Yes    Partners: Male    Birth control/protection: None  Other Topics Concern   Not on file  Social History Narrative   Married.   1 daughter.   Works at [employer] as a Lawyer.   Enjoys crafting, spending time with her daughter   Social Drivers of Corporate investment banker Strain: Low Risk  (10/08/2023)   [facility]   Overall Financial Resource Strain (CARDIA)    Difficulty of Paying Living Expenses: Not hard at all  Food Insecurity: No Food Insecurity (10/08/2023)   Received from [facility]   Hunger Vital Sign    Within the past 12 months, you worried that your food would run out before you got the money to buy more.: Never true    Within the past  12 months, the food you bought just didn't last and you didn't have money to get more.: Never true  Transportation Needs: No Transportation Needs (10/08/2023)   Received from [facility] - Transportation    In the past 12 months, has lack of transportation kept you from medical appointments or from getting medications?: No    Lack of Transportation (Non-Medical): No  Physical Activity: Not on file  Stress: Not on file  Social Connections: Unknown (09/02/2021)   Received from [facility]   Social Network    Social Network: Not on file  Intimate Partner Violence: Unknown (07/25/2021)    Received from [facility]   HITS    Physically Hurt: Not on file    Insult or Talk Down To: Not on file    Threaten Physical Harm: Not on file    Scream or Curse: Not on file    Past Surgical History:  Procedure Laterality Date   CERVICAL CONIZATION W/BX N/A 12/03/2023   Procedure: CONE BIOPSY, CERVIX;  Surgeon: Lovetta Beverli GAILS, MD;  Location: [facility] ORS;  Service: Gynecology;  Laterality: N/A;  COLD KNIFE CONIZATION , ENDOCERVICAL CURETTAGE   CHOLECYSTECTOMY N/A 11/28/2014   Procedure: LAPAROSCOPIC CHOLECYSTECTOMY WITH INTRAOPERATIVE CHOLANGIOGRAM;  Surgeon: Charlie FORBES Fell, MD;  Location: [facility] ORS;  Service: General;  Laterality: N/A;   WISDOM TOOTH EXTRACTION     x4    Family History  Problem Relation Age of Onset   Diabetes Mother    Hypertension Mother    Depression Mother    Diabetes Maternal Grandmother    Hypertension Maternal Grandmother    Heart disease Maternal Grandmother    Cancer Maternal Grandmother        skin   Diabetes Paternal Grandmother    Hypertension Paternal Grandmother    Heart disease Paternal Grandmother    Diabetes Paternal Grandfather    Hypertension Paternal Grandfather    Heart disease Paternal Grandfather     Allergies  Allergen Reactions   Adhesive [Tape] Itching   Ciprofloxacin  Hives and Nausea And Vomiting    Current Outpatient Medications on File Prior to Visit  Medication Sig Dispense Refill   Cholecalciferol (VITAMIN D-3) 125 MCG (5000 UT) TABS Take 5,000 Units by mouth in the morning.     cyanocobalamin  (VITAMIN B12) 1000 MCG/ML injection Inject 1 mL into the muscle once weekly x 4 weeks, then every other week x 4 weeks, then once monthly thereafter. 10 mL 0   etonogestrel (NEXPLANON) 68 MG IMPL implant 1 each by Subdermal route once.     fluticasone  (FLONASE ) 50 MCG/ACT nasal spray Place 2 sprays into both nostrils daily as needed for allergies.     Probiotic Product (PROBIOTIC PO) Take 1 capsule by mouth in the morning.      sertraline  (ZOLOFT ) 100 MG tablet TAKE 1 TABLET BY MOUTH ONCE DAILY FOR ANXIETY AND FOR DEPRESSION (Patient taking differently: Take 100 mg by mouth at bedtime.) 90 tablet 0   Syringe/Needle, Disp, 25G X 1 1 ML MISC Use with B12 medication 10 each 0   No current facility-administered medications on file prior to visit.    BP 118/62   Pulse 75   Temp (!) 97.5 F (36.4 C) (Temporal)   Ht 5' 4 (1.626 m)   Wt 226 lb (102.5 kg)   LMP 12/26/2023   SpO2 98%   BMI 38.79 kg/m  Objective:   Physical Exam HENT:     Right Ear: Tympanic membrane  and ear canal normal.     Left Ear: Tympanic membrane and ear canal normal.  Eyes:     Pupils: Pupils are equal, round, and reactive to light.  Cardiovascular:     Rate and Rhythm: Normal rate and regular rhythm.  Pulmonary:     Effort: Pulmonary effort is normal.     Breath sounds: Normal breath sounds.  Abdominal:     General: Bowel sounds are normal.     Palpations: Abdomen is soft.     Tenderness: There is no abdominal tenderness.  Musculoskeletal:        General: Normal range of motion.     Cervical back: Neck supple.  Skin:    General: Skin is warm and dry.  Neurological:     Mental Status: She is alert and oriented to person, place, and time.     Cranial Nerves: No cranial nerve deficit.     Deep Tendon Reflexes:     Reflex Scores:      Patellar reflexes are 2+ on the right side and 2+ on the left side. Psychiatric:        Mood and Affect: Mood normal.     Physical Exam        Assessment & Plan:  Preventative health care Assessment & Plan: Influenza vaccine provided today.  She is aware of her next HPV injection date.  Pap smear UTD.  Discussed the importance of a healthy diet and regular exercise in order for weight loss, and to reduce the risk of further co-morbidity.  Exam stable. Labs pending.  Follow up in 1 year for repeat physical.    Anxiety and depression Assessment & Plan: Controlled  overall.  Continue Zoloft  100 mg daily. Add hydroxyzine  10 mg at bedtime as needed.  Orders: -     hydrOXYzine  HCl; Take 1 tablet (10 mg total) by mouth at bedtime as needed for anxiety.  Dispense: 30 tablet; Refill: 0  Vitamin B12 deficiency Assessment & Plan: Continue B 12 injections monthly for now. May need to increase frequency.   Repeat B12 pending.   Orders: -     Vitamin B12  Hyperlipidemia, unspecified hyperlipidemia type Assessment & Plan: Repeat lipid panel pending.  Orders: -     Lipid panel -     Comprehensive metabolic panel with GFR  Insomnia, unspecified type Assessment & Plan: Recently deteriorated given upcoming surgery.  Start hydroxyzine  10 mg at bedtime as needed.  Drowsiness precautions provided.     Assessment and Plan Assessment & Plan         Comer MARLA Gaskins, NP    History of Present Illness

## 2023-12-31 NOTE — Assessment & Plan Note (Signed)
 Repeat lipid panel pending.

## 2023-12-31 NOTE — Assessment & Plan Note (Signed)
 Influenza vaccine provided today.  She is aware of her next HPV injection date.  Pap smear UTD.  Discussed the importance of a healthy diet and regular exercise in order for weight loss, and to reduce the risk of further co-morbidity.  Exam stable. Labs pending.  Follow up in 1 year for repeat physical.

## 2023-12-31 NOTE — Assessment & Plan Note (Signed)
 Recently deteriorated given upcoming surgery.  Start hydroxyzine  10 mg at bedtime as needed.  Drowsiness precautions provided.

## 2024-01-12 ENCOUNTER — Telehealth: Payer: Self-pay

## 2024-01-12 NOTE — Telephone Encounter (Signed)
 I'm not sure what this is for. If this is for her upcoming surgery then her surgeon should be completing. Can we get some additional information?

## 2024-01-12 NOTE — Telephone Encounter (Signed)
 Received Physicians statement disability forms.  Will fax to [employer] at [phone] when completed.  Patient requests a copy of completed forms be mailed to their address on file.  Placed on providers desk for review.

## 2024-01-25 ENCOUNTER — Other Ambulatory Visit: Payer: Self-pay | Admitting: Primary Care

## 2024-01-25 DIAGNOSIS — F419 Anxiety disorder, unspecified: Secondary | ICD-10-CM

## 2024-01-25 DIAGNOSIS — E538 Deficiency of other specified B group vitamins: Secondary | ICD-10-CM

## 2024-01-25 NOTE — Telephone Encounter (Signed)
 Forms completed and placed on Erin's desk.

## 2024-01-26 NOTE — Telephone Encounter (Signed)
 Completed form received and faxed to [phone]  Copy sent to scan  Copy for patient left at front desk, patient notified via Mychart.

## 2024-01-28 ENCOUNTER — Encounter
Admission: RE | Admit: 2024-01-28 | Discharge: 2024-01-28 | Disposition: A | Discharge status: Home / Self Care | Source: Ambulatory Visit | Attending: Obstetrics and Gynecology | Admitting: Obstetrics and Gynecology

## 2024-01-28 VITALS — Wt 226.0 lb

## 2024-01-28 DIAGNOSIS — Z9889 Other specified postprocedural states: Secondary | ICD-10-CM | POA: Diagnosis not present

## 2024-01-28 DIAGNOSIS — Z23 Encounter for immunization: Secondary | ICD-10-CM | POA: Diagnosis not present

## 2024-01-28 DIAGNOSIS — Z7185 Encounter for immunization safety counseling: Secondary | ICD-10-CM | POA: Diagnosis not present

## 2024-01-28 DIAGNOSIS — Z01818 Encounter for other preprocedural examination: Secondary | ICD-10-CM

## 2024-01-28 DIAGNOSIS — D069 Carcinoma in situ of cervix, unspecified: Secondary | ICD-10-CM | POA: Diagnosis not present

## 2024-01-28 NOTE — Patient Instructions (Addendum)
 Your procedure is scheduled on:02-04-24 Friday Report to the Registration Desk on the 1st floor of the [facility].Then proceed to the 2nd floor Surgery Desk To find out your arrival time, please call [phone] between 1PM - 3PM on:02-03-24 Thursday If your arrival time is 6:00 am, do not arrive before that time as the [facility] entrance doors do not open until 6:00 am.  REMEMBER: Instructions that are not followed completely may result in serious medical risk, up to and including death; or upon the discretion of your surgeon and anesthesiologist your surgery may need to be rescheduled.  Do not eat food after midnight the night before surgery.  No gum chewing or hard candies.  You may however, drink CLEAR liquids up to 2 hours before you are scheduled to arrive for your surgery. Do not drink anything within 2 hours of your scheduled arrival time.  Clear liquids include: - water  - apple juice without pulp - gatorade (not RED colors) - black coffee or tea (Do NOT add milk or creamers to the coffee or tea) Do NOT drink anything that is not on this list.  In addition, your doctor has ordered for you to drink the provided:  Ensure Pre-Surgery Clear Carbohydrate Drink  Drinking this carbohydrate drink up to two hours before surgery helps to reduce insulin resistance and improve patient outcomes. Please complete drinking 2 hours before scheduled arrival time.  One week prior to surgery:Stop NOW (01-28-24) Stop Anti-inflammatories (NSAIDS) such as Advil , Aleve, Ibuprofen , Motrin , Naproxen, Naprosyn and Aspirin based products such as Excedrin, Goody's Powder, BC Powder. Stop ANY OVER THE COUNTER supplements until after surgery (Vitamin D-3, Vitamin B12, Probiotic)  You may however, continue to take Tylenol  if needed for pain up until the day of surgery.  Continue taking all of your other prescription medications up until the day of surgery.  ON THE DAY OF SURGERY ONLY TAKE THESE  MEDICATIONS WITH SIPS OF WATER: -sertraline  (ZOLOFT )  -You may take hydrOXYzine  (ATARAX ) if needed for anxiety  No Alcohol for 24 hours before or after surgery.  No Smoking including e-cigarettes for 24 hours before surgery.  No chewable tobacco products for at least 6 hours before surgery.  No nicotine patches on the day of surgery.  Do not use any recreational drugs for at least a week (preferably 2 weeks) before your surgery.  Please be advised that the combination of cocaine and anesthesia may have negative outcomes, up to and including death. If you test positive for cocaine, your surgery will be cancelled.  On the morning of surgery brush your teeth with toothpaste and water, you may rinse your mouth with mouthwash if you wish. Do not swallow any toothpaste or mouthwash  Do not wear jewelry, make-up, hairpins, clips or nail polish.  For welded (permanent) jewelry: bracelets, anklets, waist bands, etc.  Please have this removed prior to surgery.  If it is not removed, there is a chance that hospital personnel will need to cut it off on the day of surgery.  Do not wear lotions, powders, or perfumes.   Do not shave body hair from the neck down 48 hours before surgery.  Contact lenses, hearing aids and dentures may not be worn into surgery.  Do not bring valuables to the hospital. [facility] is not responsible for any missing/lost belongings or valuables.   Notify your doctor if there is any change in your medical condition (cold, fever, infection).  Wear comfortable clothing (specific to your surgery  type) to the hospital.  After surgery, you can help prevent lung complications by doing breathing exercises.  Take deep breaths and cough every 1-2 hours. Your doctor may order a device called an Incentive Spirometer to help you take deep breaths. When coughing or sneezing, hold a pillow firmly against your incision with both hands. This is called "splinting." Doing this helps  protect your incision. It also decreases belly discomfort.  If you are being admitted to the hospital overnight, leave your suitcase in the car. After surgery it may be brought to your room.  In case of increased patient census, it may be necessary for you, the patient, to continue your postoperative care in the Same Day Surgery department.  If you are being discharged the day of surgery, you will not be allowed to drive home. You will need a responsible individual to drive you home and stay with you for 24 hours after surgery.   If you are taking public transportation, you will need to have a responsible individual with you.  Please call the Pre-admissions Testing Dept. at [phone] if you have any questions about these instructions.  Surgery Visitation Policy:  Patients having surgery or a procedure may have two visitors.  Children under the age of 6 must have an adult with them who is not the patient.  Merchandiser, retail to address health-related social needs:  [email or web address]
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
## 2024-02-01 ENCOUNTER — Inpatient Hospital Stay
Admission: RE | Admit: 2024-02-01 | Discharge: 2024-02-01 | Disposition: A | Source: Ambulatory Visit | Attending: Obstetrics and Gynecology

## 2024-02-01 DIAGNOSIS — F32A Depression, unspecified: Secondary | ICD-10-CM | POA: Diagnosis not present

## 2024-02-01 DIAGNOSIS — E669 Obesity, unspecified: Secondary | ICD-10-CM | POA: Diagnosis not present

## 2024-02-01 DIAGNOSIS — Z6838 Body mass index (BMI) 38.0-38.9, adult: Secondary | ICD-10-CM | POA: Diagnosis not present

## 2024-02-01 DIAGNOSIS — F419 Anxiety disorder, unspecified: Secondary | ICD-10-CM | POA: Diagnosis not present

## 2024-02-01 DIAGNOSIS — Z01818 Encounter for other preprocedural examination: Secondary | ICD-10-CM

## 2024-02-01 DIAGNOSIS — D060 Carcinoma in situ of endocervix: Secondary | ICD-10-CM | POA: Diagnosis not present

## 2024-02-01 DIAGNOSIS — Z01812 Encounter for preprocedural laboratory examination: Secondary | ICD-10-CM | POA: Insufficient documentation

## 2024-02-01 LAB — CBC
HCT: 38.8 % (ref 36.0–46.0)
Hemoglobin: 13.1 g/dL (ref 12.0–15.0)
MCH: 30 pg (ref 26.0–34.0)
MCHC: 33.8 g/dL (ref 30.0–36.0)
MCV: 88.8 fL (ref 80.0–100.0)
Platelets: 263 K/uL (ref 150–400)
RBC: 4.37 MIL/uL (ref 3.87–5.11)
RDW: 12 % (ref 11.5–15.5)
WBC: 5.9 K/uL (ref 4.0–10.5)
nRBC: 0 % (ref 0.0–0.2)

## 2024-02-01 LAB — TYPE AND SCREEN
ABO/RH(D): A POS
Antibody Screen: NEGATIVE

## 2024-02-04 ENCOUNTER — Other Ambulatory Visit

## 2024-02-04 ENCOUNTER — Ambulatory Visit: Payer: Self-pay | Admitting: Urgent Care

## 2024-02-04 ENCOUNTER — Ambulatory Visit: Admitting: Anesthesiology

## 2024-02-04 ENCOUNTER — Other Ambulatory Visit: Payer: Self-pay

## 2024-02-04 ENCOUNTER — Encounter
Admission: RE | Disposition: A | Discharge status: Home / Self Care | Payer: Self-pay | Source: Home / Self Care | Attending: Obstetrics and Gynecology

## 2024-02-04 ENCOUNTER — Ambulatory Visit
Admission: RE | Admit: 2024-02-04 | Discharge: 2024-02-04 | Disposition: A | Discharge status: Home / Self Care | Attending: Obstetrics and Gynecology | Admitting: Obstetrics and Gynecology

## 2024-02-04 ENCOUNTER — Encounter: Payer: Self-pay | Admitting: Obstetrics and Gynecology

## 2024-02-04 DIAGNOSIS — D060 Carcinoma in situ of endocervix: Secondary | ICD-10-CM | POA: Diagnosis not present

## 2024-02-04 DIAGNOSIS — F32A Depression, unspecified: Secondary | ICD-10-CM | POA: Diagnosis not present

## 2024-02-04 DIAGNOSIS — D069 Carcinoma in situ of cervix, unspecified: Secondary | ICD-10-CM | POA: Diagnosis not present

## 2024-02-04 DIAGNOSIS — E669 Obesity, unspecified: Secondary | ICD-10-CM | POA: Insufficient documentation

## 2024-02-04 DIAGNOSIS — F419 Anxiety disorder, unspecified: Secondary | ICD-10-CM | POA: Diagnosis not present

## 2024-02-04 DIAGNOSIS — F418 Other specified anxiety disorders: Secondary | ICD-10-CM | POA: Diagnosis not present

## 2024-02-04 DIAGNOSIS — Z01818 Encounter for other preprocedural examination: Secondary | ICD-10-CM

## 2024-02-04 DIAGNOSIS — K219 Gastro-esophageal reflux disease without esophagitis: Secondary | ICD-10-CM | POA: Diagnosis not present

## 2024-02-04 DIAGNOSIS — Z6838 Body mass index (BMI) 38.0-38.9, adult: Secondary | ICD-10-CM | POA: Diagnosis not present

## 2024-02-04 HISTORY — PX: DILATION AND CURETTAGE OF UTERUS: SHX78

## 2024-02-04 HISTORY — PX: CERVICAL CONIZATION W/BX: SHX1330

## 2024-02-04 LAB — POCT PREGNANCY, URINE: Preg Test, Ur: NEGATIVE

## 2024-02-04 SURGERY — CONE BIOPSY, CERVIX
Anesthesia: General | Site: Cervix

## 2024-02-04 MED ORDER — DEXAMETHASONE SOD PHOSPHATE PF 10 MG/ML IJ SOLN
INTRAMUSCULAR | Status: DC | PRN
  Administered 2024-02-04: 10 mg via INTRAVENOUS

## 2024-02-04 MED ORDER — LIDOCAINE HCL (CARDIAC) PF 100 MG/5ML IV SOSY
PREFILLED_SYRINGE | INTRAVENOUS | Status: DC | PRN
  Administered 2024-02-04: 80 mg via INTRAVENOUS

## 2024-02-04 MED ORDER — SODIUM CHLORIDE 0.9 % IV SOLN: INTRAVENOUS | Status: DC

## 2024-02-04 MED ORDER — LACTATED RINGERS IV SOLN: INTRAVENOUS | Status: DC

## 2024-02-04 MED ORDER — ONDANSETRON HCL 4 MG/2ML IJ SOLN
INTRAMUSCULAR | Status: DC | PRN
  Administered 2024-02-04 (×2): 4 mg via INTRAVENOUS

## 2024-02-04 MED ORDER — FENTANYL CITRATE (PF) 100 MCG/2ML IJ SOLN
INTRAMUSCULAR | Status: AC
  Filled 2024-02-04: qty 2

## 2024-02-04 MED ORDER — FENTANYL CITRATE (PF) 100 MCG/2ML IJ SOLN
INTRAMUSCULAR | Status: DC | PRN
  Administered 2024-02-04 (×2): 25 ug via INTRAVENOUS
  Administered 2024-02-04: 50 ug via INTRAVENOUS

## 2024-02-04 MED ORDER — 0.9 % SODIUM CHLORIDE (POUR BTL) OPTIME
TOPICAL | Status: DC | PRN
  Administered 2024-02-04: 500 mL

## 2024-02-04 MED ORDER — GLYCOPYRROLATE 0.2 MG/ML IJ SOLN
INTRAMUSCULAR | Status: DC | PRN
  Administered 2024-02-04: .2 mg via INTRAVENOUS

## 2024-02-04 MED ORDER — DROPERIDOL 2.5 MG/ML IJ SOLN: 0.6250 mg | Freq: Once | INTRAMUSCULAR | Status: DC | PRN

## 2024-02-04 MED ORDER — ACETAMINOPHEN 500 MG PO TABS
1000.0000 mg | ORAL_TABLET | ORAL | Status: AC
  Administered 2024-02-04: 1000 mg via ORAL

## 2024-02-04 MED ORDER — ORAL CARE MOUTH RINSE: 15.0000 mL | Freq: Once | OROMUCOSAL | Status: AC

## 2024-02-04 MED ORDER — GELATIN ABSORBABLE 12-7 MM EX MISC
CUTANEOUS | Status: AC
  Filled 2024-02-04: qty 1

## 2024-02-04 MED ORDER — CHLORHEXIDINE GLUCONATE 0.12 % MT SOLN
15.0000 mL | Freq: Once | OROMUCOSAL | Status: AC
  Administered 2024-02-04: 15 mL via OROMUCOSAL

## 2024-02-04 MED ORDER — LIDOCAINE-EPINEPHRINE 1 %-1:100000 IJ SOLN
INTRAMUSCULAR | Status: DC | PRN
  Administered 2024-02-04: 30 mL

## 2024-02-04 MED ORDER — PROPOFOL 10 MG/ML IV BOLUS
INTRAVENOUS | Status: AC
  Filled 2024-02-04: qty 20

## 2024-02-04 MED ORDER — MIDAZOLAM HCL 2 MG/2ML IJ SOLN
INTRAMUSCULAR | Status: AC
  Filled 2024-02-04: qty 2

## 2024-02-04 MED ORDER — HEMOSTATIC AGENTS (NO CHARGE) OPTIME
TOPICAL | Status: DC | PRN
  Administered 2024-02-04: 1

## 2024-02-04 MED ORDER — OXYCODONE HCL 5 MG PO TABS
5.0000 mg | ORAL_TABLET | Freq: Once | ORAL | Status: AC
  Administered 2024-02-04: 5 mg via ORAL

## 2024-02-04 MED ORDER — EPHEDRINE SULFATE-NACL 50-0.9 MG/10ML-% IV SOSY
PREFILLED_SYRINGE | INTRAVENOUS | Status: DC | PRN
  Administered 2024-02-04: 5 mg via INTRAVENOUS
  Administered 2024-02-04: 10 mg via INTRAVENOUS
  Administered 2024-02-04: 5 mg via INTRAVENOUS

## 2024-02-04 MED ORDER — GLYCOPYRROLATE 0.2 MG/ML IJ SOLN
INTRAMUSCULAR | Status: AC
  Filled 2024-02-04: qty 1

## 2024-02-04 MED ORDER — POVIDONE-IODINE 10 % EX SWAB: 2.0000 | Freq: Once | CUTANEOUS | Status: DC

## 2024-02-04 MED ORDER — LIDOCAINE-EPINEPHRINE 1 %-1:100000 IJ SOLN
INTRAMUSCULAR | Status: AC
  Filled 2024-02-04: qty 1

## 2024-02-04 MED ORDER — MONSELS FERRIC SUBSULFATE EX SOLN
CUTANEOUS | Status: AC
  Filled 2024-02-04: qty 8

## 2024-02-04 MED ORDER — MIDAZOLAM HCL (PF) 2 MG/2ML IJ SOLN
INTRAMUSCULAR | Status: DC | PRN
  Administered 2024-02-04: 2 mg via INTRAVENOUS

## 2024-02-04 MED ORDER — PROPOFOL 10 MG/ML IV BOLUS
INTRAVENOUS | Status: DC | PRN
  Administered 2024-02-04: 200 mg via INTRAVENOUS

## 2024-02-04 MED ORDER — PHENYLEPHRINE 80 MCG/ML (10ML) SYRINGE FOR IV PUSH (FOR BLOOD PRESSURE SUPPORT)
PREFILLED_SYRINGE | INTRAVENOUS | Status: DC | PRN
  Administered 2024-02-04: 80 ug via INTRAVENOUS
  Administered 2024-02-04 (×3): 160 ug via INTRAVENOUS
  Administered 2024-02-04: 80 ug via INTRAVENOUS

## 2024-02-04 MED ORDER — IODINE STRONG (LUGOLS) 5 % PO SOLN
ORAL | Status: DC | PRN
  Administered 2024-02-04: 14 mL

## 2024-02-04 MED ORDER — MONSELS FERRIC SUBSULFATE EX SOLN
CUTANEOUS | Status: DC | PRN
  Administered 2024-02-04: 1 via TOPICAL

## 2024-02-04 MED ORDER — PHENYLEPHRINE 80 MCG/ML (10ML) SYRINGE FOR IV PUSH (FOR BLOOD PRESSURE SUPPORT)
PREFILLED_SYRINGE | INTRAVENOUS | Status: AC
  Filled 2024-02-04: qty 10

## 2024-02-04 MED ORDER — FENTANYL CITRATE (PF) 100 MCG/2ML IJ SOLN
25.0000 ug | INTRAMUSCULAR | Status: DC | PRN
  Administered 2024-02-04 (×2): 50 ug via INTRAVENOUS

## 2024-02-04 MED ORDER — OXYCODONE HCL 5 MG PO TABS
ORAL_TABLET | ORAL | Status: AC
  Filled 2024-02-04: qty 1

## 2024-02-04 MED ORDER — ACETAMINOPHEN 500 MG PO TABS
ORAL_TABLET | ORAL | Status: AC
  Filled 2024-02-04: qty 2

## 2024-02-04 MED ORDER — EPHEDRINE 5 MG/ML INJ
INTRAVENOUS | Status: AC
  Filled 2024-02-04: qty 5

## 2024-02-04 MED ORDER — IODINE STRONG (LUGOLS) 5 % PO SOLN
ORAL | Status: AC
  Filled 2024-02-04: qty 1

## 2024-02-04 MED ADMIN — Dexmedetomidine HCl in NaCl 0.9% IV Soln 80 MCG/20ML: 12 ug | INTRAVENOUS | NDC 00781349395

## 2024-02-04 MED FILL — Chlorhexidine Gluconate Soln 0.12%: OROMUCOSAL | Qty: 15 | Status: AC

## 2024-02-04 MED FILL — Ondansetron HCl Inj 4 MG/2ML (2 MG/ML): INTRAMUSCULAR | Qty: 2 | Status: AC

## 2024-02-04 MED FILL — Lidocaine HCl Local Preservative Free (PF) Inj 2%: INTRAMUSCULAR | Qty: 5 | Status: AC

## 2024-02-04 SURGICAL SUPPLY — 31 items
APPLICATOR COTTON TIP 6 STRL (MISCELLANEOUS) ×2 IMPLANT
APPLICATOR SWAB PROCTO LG 16IN (MISCELLANEOUS) ×2 IMPLANT
BLADE SURG SZ11 CARB STEEL (BLADE) IMPLANT
CATH ROBINSON RED A/P 16FR (CATHETERS) ×2 IMPLANT
CNTNR URN SCR LID CUP LEK RST (MISCELLANEOUS) ×2 IMPLANT
DRAPE PERI LITHO V/GYN (MISCELLANEOUS) ×2 IMPLANT
DRESSING SURGICEL FIBRLLR 1X2 (HEMOSTASIS) IMPLANT
DRSG TELFA 3X4 N-ADH STERILE (GAUZE/BANDAGES/DRESSINGS) IMPLANT
ELECTRODE LEEP BALL 5MM 12CM (MISCELLANEOUS) ×2 IMPLANT
ELECTRODE LOOP 1.0X1.0CM R1010 (MISCELLANEOUS) ×2 IMPLANT
ELECTRODE REM PT RTRN 9FT ADLT (ELECTROSURGICAL) ×2 IMPLANT
GAUZE 4X4 16PLY ~~LOC~~+RFID DBL (SPONGE) ×2 IMPLANT
GLOVE SURG SYN 6.5 PF PI (GLOVE) ×4 IMPLANT
GOWN STRL REUS W/ TWL LRG LVL3 (GOWN DISPOSABLE) ×4 IMPLANT
KIT TURNOVER CYSTO (KITS) ×2 IMPLANT
MANIFOLD NEPTUNE II (INSTRUMENTS) ×2 IMPLANT
NDL HYPO 30X.5 LL (NEEDLE) IMPLANT
NDL SPNL 22GX3.5 QUINCKE BK (NEEDLE) ×2 IMPLANT
NEEDLE HYPO 30X.5 LL (NEEDLE) IMPLANT
NEEDLE SPNL 22GX3.5 QUINCKE BK (NEEDLE) ×2 IMPLANT
PACK BASIN MINOR ARMC (MISCELLANEOUS) ×2 IMPLANT
PAD OB MATERNITY 11 LF (PERSONAL CARE ITEMS) ×2 IMPLANT
PAD PREP OB/GYN DISP 24X41 (PERSONAL CARE ITEMS) ×2 IMPLANT
SCRUB CHG 4% DYNA-HEX 4OZ (MISCELLANEOUS) ×2 IMPLANT
SPONGE SURGIFOAM ABS GEL 12-7 (HEMOSTASIS) IMPLANT
STRAW SMOKE EVAC LEEP 6150 NON (MISCELLANEOUS) ×2 IMPLANT
SURGILUBE 2OZ TUBE FLIPTOP (MISCELLANEOUS) ×2 IMPLANT
SUT SILK 2 0 SH (SUTURE) ×2 IMPLANT
SUT VIC AB 0 SH 27 (SUTURE) ×2 IMPLANT
SYR CONTROL 10ML LL (SYRINGE) ×2 IMPLANT
TRAP FLUID SMOKE EVACUATOR (MISCELLANEOUS) ×2 IMPLANT

## 2024-02-04 NOTE — Anesthesia Preprocedure Evaluation (Signed)
 Anesthesia Evaluation  Patient identified by MRN, date of birth, ID band Patient awake    Reviewed: Allergy & Precautions, H&P , NPO status , Patient's Chart, lab work & pertinent test results, reviewed documented beta blocker date and time   History of Anesthesia Complications Negative for: history of anesthetic complications  Airway Mallampati: IV  TM Distance: >3 FB Neck ROM: full  Mouth opening: Limited Mouth Opening  Dental  (+) Dental Advidsory Given, Teeth Intact   Pulmonary neg pulmonary ROS   Pulmonary exam normal breath sounds clear to auscultation       Cardiovascular Exercise Tolerance: Good negative cardio ROS Normal cardiovascular exam Rhythm:regular Rate:Normal     Neuro/Psych  PSYCHIATRIC DISORDERS Anxiety Depression    negative neurological ROS     GI/Hepatic Neg liver ROS,GERD  ,,  Endo/Other  negative endocrine ROS    Renal/GU negative Renal ROS  negative genitourinary   Musculoskeletal   Abdominal   Peds  Hematology negative hematology ROS (+)   Anesthesia Other Findings Past Medical History: 09/01/2022: Abscess of nose No date: Anemia No date: Cervical adenocarcinoma (HCC) No date: Cholelithiasis 08/12/2017: Decreased fetus movements affecting management of mother  in third trimester No date: Depression 12/24/2016: Depression affecting pregnancy No date: GERD (gastroesophageal reflux disease) No date: Hyperlipidemia No date: Insomnia 12/24/2016: Obesity affecting pregnancy No date: Panic attack No date: Periorbital cellulitis of left eye No date: Seasonal allergies 12/24/2016: Supervision of high-risk pregnancy No date: Vitamin B12 deficiency   Reproductive/Obstetrics negative OB ROS                              Anesthesia Physical Anesthesia Plan  ASA: 2  Anesthesia Plan: General   Post-op Pain Management:    Induction: Intravenous  PONV Risk  Score and Plan: 3 and Ondansetron , Dexamethasone , Midazolam  and Treatment may vary due to age or medical condition  Airway Management Planned: Oral ETT and LMA  Additional Equipment:   Intra-op Plan:   Post-operative Plan: Extubation in OR  Informed Consent: I have reviewed the patients History and Physical, chart, labs and discussed the procedure including the risks, benefits and alternatives for the proposed anesthesia with the patient or authorized representative who has indicated his/her understanding and acceptance.     Dental Advisory Given  Plan Discussed with: Anesthesiologist, CRNA and Surgeon  Anesthesia Plan Comments:          Anesthesia Quick Evaluation

## 2024-02-04 NOTE — Interval H&P Note (Signed)
 History and Physical Interval Note:  02/04/2024 9:42 AM  Loretta Burton  has presented today for surgery, with the diagnosis of adenocarcinoma in situ.  The various methods of treatment have been discussed with the patient and family. After consideration of risks, benefits and other options for treatment, the patient has consented to  Procedure(s) with comments: CONE BIOPSY, CERVIX (N/A) - ENDOCERVICAL CURETTAGE as a surgical intervention.  The patient's history has been reviewed, patient examined, no change in status, stable for surgery.  I have reviewed the patient's chart and labs.  Questions were answered to the patient's satisfaction.     Jhayden Demuro V Saya Mccoll

## 2024-02-04 NOTE — Anesthesia Procedure Notes (Signed)
 Procedure Name: LMA Insertion Date/Time: 02/04/2024 11:57 AM  Performed by: Lacretia Camelia NOVAK, CRNAPre-anesthesia Checklist: Patient identified, Emergency Drugs available, Suction available and Patient being monitored Patient Re-evaluated:Patient Re-evaluated prior to induction Oxygen Delivery Method: Circle system utilized Preoxygenation: Pre-oxygenation with 100% oxygen Induction Type: IV induction Ventilation: Mask ventilation without difficulty LMA: LMA inserted LMA Size: 4.0 Placement Confirmation: positive ETCO2 Tube secured with: Tape Dental Injury: Teeth and Oropharynx as per pre-operative assessment

## 2024-02-04 NOTE — Op Note (Signed)
 Operative Report    Patient Name: Loretta Burton MRN: [id number] Date: 02/04/2024   Preoperative diagnosis: Adenocarcinoma in situ of the cervix, s/p previous cold knife conization Postoperative diagnosis: Same   Procedure: Cold knife Conization, endocervical curettage   Anesthesia: General   Surgeon: Beverli Dinsmore, MD   Findings: Vaginal mucosa without lesions. Multiparous cervix without visible lesions. Cervix well healed from previous conization and normal appearing other than 0.25cm defect in the ectocervix at 7 o'clock. The cervix was normal appearing after application of Lugol's; all tissue took up Lugol's. A cone shaped portion of cervix was removed- 1.5 cm in depth (marking stitch at 12 o'clock). Endocervical curettage performed afterwards.    Estimated blood loss in the OR: 30 Fluids: 700cc Urine output: 300cc   Complications: None               Procedure:  After informed consent was obtained, the patient was brought to the operating room where satisfactory general anesthesia was induced.  The patient was placed in the [place] stirrups and prepped and draped in the usual sterile fashion. Timeout was completed. A coated speculum with suction was then placed in the vagina. The cervix was grasped with a tenaculum. Two stay sutures were placed with 0-vicryl at the 3 and 9 o'clock positions on the cervix.  A paracervical block was performed with 10cc of 1% lidocaine  with epinephrine  injected into the vaginal fornices at 4 and 8 o'clock. An additional 10 cc was injected into the body of at the cervix at 2, 4, 8, and 10 o'clock. Lugol's was painted on the cervix. A straight 11-blade scalpel was used to excise the specimen, a cone shaped portion of the cervix including 1.5 cm deep. The cone cylinder specimen was marked with suture at the 12 o'clock position. Endocervical curettage was then performed.    The excised base was cauterized with the Bovie rollerball, and hemostasis  was confirmed. Monsel's solution was placed over the base along with a sheet of Surgicel.The two cervical stay sutures were tied together. Hemostasis was again confirmed. The weighted speculum and right angle retractor were removed. Bladder was drained with an in and out catheter and urine noted to be clear. She tolerated the procedure well. Sponge, lap, and needle counts correct X2. Anesthesia was reversed.  The patient was extubated and taken to the PACU in stable condition.     Beverli LULLA Dinsmore, MD 02/04/2024 12:54 PM

## 2024-02-04 NOTE — Transfer of Care (Signed)
 Immediate Anesthesia Transfer of Care Note  Patient: Loretta Burton  Procedure(s) Performed: CONE BIOPSY, CERVIX (Cervix) ENDOCERVICAL CURETTAGE (Cervix)  Patient Location: PACU  Anesthesia Type:General  Level of Consciousness: awake, drowsy, and patient cooperative  Airway & Oxygen Therapy: Patient Spontanous Breathing and Patient connected to face mask oxygen  Post-op Assessment: Report given to RN and Post -op Vital signs reviewed and stable  Post vital signs: Reviewed and stable  Last Vitals:  Vitals Value Taken Time  BP 118/59 02/04/24 13:01  Temp 36.4 C 02/04/24 13:01  Pulse 84 02/04/24 13:04  Resp 22 02/04/24 13:04  SpO2 100 % 02/04/24 13:04  Vitals shown include unfiled device data.  Last Pain:  Vitals:   02/04/24 1301  TempSrc:   PainSc: Asleep         Complications: No notable events documented.

## 2024-02-05 NOTE — Anesthesia Postprocedure Evaluation (Signed)
 Anesthesia Post Note  Patient: Loretta Burton  Procedure(s) Performed: CONE BIOPSY, CERVIX (Cervix) ENDOCERVICAL CURETTAGE (Cervix)  Patient location during evaluation: PACU Anesthesia Type: General Level of consciousness: awake and alert Pain management: pain level controlled Vital Signs Assessment: post-procedure vital signs reviewed and stable Respiratory status: spontaneous breathing, nonlabored ventilation, respiratory function stable and patient connected to nasal cannula oxygen Cardiovascular status: blood pressure returned to baseline and stable Postop Assessment: no apparent nausea or vomiting Anesthetic complications: no   No notable events documented.   Last Vitals:  Vitals:   02/04/24 1400 02/04/24 1414  BP: (!) 110/55 (!) 111/59  Pulse: 88 85  Resp: 18 16  Temp: 36.5 C 36.8 C  SpO2: 95% 95%    Last Pain:  Vitals:   02/04/24 1414  TempSrc: Temporal  PainSc:                  Prentice Murphy

## 2024-02-06 ENCOUNTER — Encounter: Payer: Self-pay | Admitting: Obstetrics and Gynecology

## 2024-02-07 LAB — SURGICAL PATHOLOGY

## 2024-02-11 ENCOUNTER — Emergency Department (HOSPITAL_COMMUNITY)

## 2024-02-11 ENCOUNTER — Emergency Department (HOSPITAL_COMMUNITY)
Admission: EM | Admit: 2024-02-11 | Discharge: 2024-02-12 | Disposition: A | Discharge status: Home / Self Care | Attending: Emergency Medicine | Admitting: Emergency Medicine

## 2024-02-11 ENCOUNTER — Other Ambulatory Visit: Payer: Self-pay

## 2024-02-11 DIAGNOSIS — R110 Nausea: Secondary | ICD-10-CM | POA: Insufficient documentation

## 2024-02-11 DIAGNOSIS — C539 Malignant neoplasm of cervix uteri, unspecified: Secondary | ICD-10-CM | POA: Diagnosis present

## 2024-02-11 DIAGNOSIS — Z8249 Family history of ischemic heart disease and other diseases of the circulatory system: Secondary | ICD-10-CM | POA: Diagnosis not present

## 2024-02-11 DIAGNOSIS — E785 Hyperlipidemia, unspecified: Secondary | ICD-10-CM | POA: Diagnosis not present

## 2024-02-11 DIAGNOSIS — R509 Fever, unspecified: Secondary | ICD-10-CM | POA: Diagnosis not present

## 2024-02-11 DIAGNOSIS — Z881 Allergy status to other antibiotic agents status: Secondary | ICD-10-CM | POA: Diagnosis not present

## 2024-02-11 DIAGNOSIS — N12 Tubulo-interstitial nephritis, not specified as acute or chronic: Secondary | ICD-10-CM | POA: Diagnosis not present

## 2024-02-11 DIAGNOSIS — Z833 Family history of diabetes mellitus: Secondary | ICD-10-CM | POA: Diagnosis not present

## 2024-02-11 DIAGNOSIS — Z818 Family history of other mental and behavioral disorders: Secondary | ICD-10-CM | POA: Diagnosis not present

## 2024-02-11 DIAGNOSIS — B961 Klebsiella pneumoniae [K. pneumoniae] as the cause of diseases classified elsewhere: Secondary | ICD-10-CM | POA: Diagnosis not present

## 2024-02-11 DIAGNOSIS — F32A Depression, unspecified: Secondary | ICD-10-CM | POA: Diagnosis not present

## 2024-02-11 DIAGNOSIS — Z808 Family history of malignant neoplasm of other organs or systems: Secondary | ICD-10-CM | POA: Diagnosis not present

## 2024-02-11 DIAGNOSIS — R7881 Bacteremia: Secondary | ICD-10-CM | POA: Diagnosis not present

## 2024-02-11 DIAGNOSIS — R102 Pelvic and perineal pain unspecified side: Secondary | ICD-10-CM | POA: Insufficient documentation

## 2024-02-11 DIAGNOSIS — R160 Hepatomegaly, not elsewhere classified: Secondary | ICD-10-CM | POA: Diagnosis not present

## 2024-02-11 DIAGNOSIS — Z91048 Other nonmedicinal substance allergy status: Secondary | ICD-10-CM | POA: Diagnosis not present

## 2024-02-11 DIAGNOSIS — R000 Tachycardia, unspecified: Secondary | ICD-10-CM | POA: Diagnosis not present

## 2024-02-11 DIAGNOSIS — K5730 Diverticulosis of large intestine without perforation or abscess without bleeding: Secondary | ICD-10-CM | POA: Diagnosis not present

## 2024-02-11 DIAGNOSIS — N200 Calculus of kidney: Secondary | ICD-10-CM | POA: Diagnosis not present

## 2024-02-11 LAB — URINALYSIS, W/ REFLEX TO CULTURE (INFECTION SUSPECTED)
Bacteria, UA: NONE SEEN
Bilirubin Urine: NEGATIVE
Glucose, UA: NEGATIVE mg/dL
Ketones, ur: NEGATIVE mg/dL
Nitrite: NEGATIVE
Protein, ur: NEGATIVE mg/dL
Specific Gravity, Urine: 1.019 (ref 1.005–1.030)
pH: 5 (ref 5.0–8.0)

## 2024-02-11 LAB — COMPREHENSIVE METABOLIC PANEL WITH GFR
ALT: 26 U/L (ref 0–44)
AST: 23 U/L (ref 15–41)
Albumin: 4.2 g/dL (ref 3.5–5.0)
Alkaline Phosphatase: 59 U/L (ref 38–126)
Anion gap: 15 (ref 5–15)
BUN: 11 mg/dL (ref 6–20)
CO2: 19 mmol/L — ABNORMAL LOW (ref 22–32)
Calcium: 10 mg/dL (ref 8.9–10.3)
Chloride: 103 mmol/L (ref 98–111)
Creatinine, Ser: 0.66 mg/dL (ref 0.44–1.00)
GFR, Estimated: >60 mL/min (ref >60)
Glucose, Bld: 104 mg/dL — ABNORMAL HIGH (ref 70–99)
Potassium: 3.7 mmol/L (ref 3.5–5.1)
Sodium: 137 mmol/L (ref 135–145)
Total Bilirubin: 0.5 mg/dL (ref 0.0–1.2)
Total Protein: 7.6 g/dL (ref 6.5–8.1)

## 2024-02-11 LAB — I-STAT CG4 LACTIC ACID, ED: Lactic Acid, Venous: 2.1 mmol/L (ref 0.5–1.9)

## 2024-02-11 LAB — CBC WITH DIFFERENTIAL/PLATELET
Abs Immature Granulocytes: 0.06 K/uL (ref 0.00–0.07)
Basophils Absolute: 0.1 K/uL (ref 0.0–0.1)
Basophils Relative: 1 %
Eosinophils Absolute: 0.1 K/uL (ref 0.0–0.5)
Eosinophils Relative: 1 %
HCT: 40 % (ref 36.0–46.0)
Hemoglobin: 13.6 g/dL (ref 12.0–15.0)
Immature Granulocytes: 1 %
Lymphocytes Relative: 21 %
Lymphs Abs: 1.9 K/uL (ref 0.7–4.0)
MCH: 30.5 pg (ref 26.0–34.0)
MCHC: 34 g/dL (ref 30.0–36.0)
MCV: 89.7 fL (ref 80.0–100.0)
Monocytes Absolute: 0.7 K/uL (ref 0.1–1.0)
Monocytes Relative: 7 %
Neutro Abs: 6.4 K/uL (ref 1.7–7.7)
Neutrophils Relative %: 69 %
Platelets: 261 K/uL (ref 150–400)
RBC: 4.46 MIL/uL (ref 3.87–5.11)
RDW: 12.1 % (ref 11.5–15.5)
WBC: 9.2 K/uL (ref 4.0–10.5)
nRBC: 0 % (ref 0.0–0.2)

## 2024-02-11 LAB — HCG, SERUM, QUALITATIVE: Preg, Serum: NEGATIVE

## 2024-02-11 MED ORDER — FENTANYL CITRATE (PF) 50 MCG/ML IJ SOSY
50.0000 ug | PREFILLED_SYRINGE | Freq: Once | INTRAMUSCULAR | Status: AC
  Administered 2024-02-11: 50 ug via INTRAVENOUS
  Filled 2024-02-11: qty 1

## 2024-02-11 MED ORDER — SODIUM CHLORIDE 0.9 % IV BOLUS
1000.0000 mL | Freq: Once | INTRAVENOUS | Status: AC
  Administered 2024-02-11: 1000 mL via INTRAVENOUS

## 2024-02-11 MED ORDER — ONDANSETRON HCL 4 MG/2ML IJ SOLN
4.0000 mg | Freq: Once | INTRAMUSCULAR | Status: AC
  Administered 2024-02-11: 4 mg via INTRAVENOUS
  Filled 2024-02-11: qty 2

## 2024-02-11 MED ORDER — ACETAMINOPHEN 325 MG PO TABS
650.0000 mg | ORAL_TABLET | Freq: Once | ORAL | Status: AC
  Administered 2024-02-11: 650 mg via ORAL
  Filled 2024-02-11: qty 2

## 2024-02-11 NOTE — ED Triage Notes (Signed)
 Pt arrived from home via POV c/o post op pelvic pain 5/10 and fever. Pt had cervical biopsy last Friday.

## 2024-02-11 NOTE — ED Provider Notes (Signed)
 [place] EMERGENC[facility] DEPARTMENT AT [facility] Provider Note   CSN: [id number] Arrival date & time: 02/11/24  2042     Patient presents with: Post-op Problem and Fever   Loretta Burton is a 38 y.o. female.   The history is provided by the patient and medical records.  Fever  38 year old female with history of depression, anemia, B12 deficiency, GERD, history of cervical adenocarcinoma in situ status post repeat CKC on 02/04/2024 with Dr. Lovetta.  States procedure went well without noted complication.  States since day 2-3 she has felt different than prior CKC.  She reports some ongoing nausea, pelvic discomfort, etc.  She got unfortunate news about her biopsy later today so thought may be she was just overreacting/panicking, however shortly after speaking with her doctor she developed a fever up to 100.47F with chills.  she does report some mild bloody vaginal drainage (appears like coffee grounds) since day of procedure.  Denies purulent discharge.  No vomiting or diarrhea.  BM's normal.  Denies cough, nasal congestion, ear pain, or other URI symptoms.  Has been at home since surgery, no sick contacts.    Prior to Admission medications   Medication Sig Start Date End Date Taking? Authorizing Provider  Cholecalciferol (VITAMIN D-3) 125 MCG (5000 UT) TABS Take 5,000 Units by mouth in the morning.    [provider]  Cyanocobalamin  (B-12) 5000 MCG CAPS Take 5,000 mcg by mouth daily.    [provider]  etonogestrel (NEXPLANON) 68 MG IMPL implant 1 each by Subdermal route once.    [provider]  fluticasone  (FLONASE ) 50 MCG/ACT nasal spray Place 2 sprays into both nostrils daily as needed for allergies.    [provider]  hydrOXYzine  (ATARAX ) 10 MG tablet Take 1 tablet (10 mg total) by mouth at bedtime as needed for anxiety. 12/31/23   Clark, Katherine K, NP  Probiotic Product (PROBIOTIC PO) Take 1 capsule by mouth in the morning.     [provider]  sertraline  (ZOLOFT ) 100 MG tablet TAKE 1 TABLET BY MOUTH ONCE DAILY FOR ANXIETY AND FOR DEPRESSION Patient taking differently: Take 100 mg by mouth every morning. 11/05/23   Gretta Comer POUR, NP    Allergies: Adhesive [tape] and Ciprofloxacin     Review of Systems  Constitutional:  Positive for fever.  All other systems reviewed and are negative.   Updated Vital Signs BP 132/69 (BP Location: Right Arm)   Pulse (!) 103   Temp 99.7 F (37.6 C)   Resp (!) 22   SpO2 99%   Physical Exam Vitals and nursing note reviewed.  Constitutional:      Appearance: She is well-developed.  HENT:     Head: Normocephalic and atraumatic.  Eyes:     Conjunctiva/sclera: Conjunctivae normal.     Pupils: Pupils are equal, round, and reactive to light.  Cardiovascular:     Rate and Rhythm: Normal rate and regular rhythm.     Heart sounds: Normal heart sounds.  Pulmonary:     Effort: Pulmonary effort is normal.     Breath sounds: Normal breath sounds.  Abdominal:     General: Bowel sounds are normal.     Palpations: Abdomen is soft.     Tenderness: There is no rebound.     Comments: Soft, not peritoneal  Musculoskeletal:        General: Normal range of motion.     Cervical back: Normal range of motion.  Skin:    General:  Skin is warm and dry.  Neurological:     Mental Status: She is alert and oriented to person, place, and time.     (all labs ordered are listed, but only abnormal results are displayed) Labs Reviewed  COMPREHENSIVE METABOLIC PANEL WITH GFR - Abnormal; Notable for the following components:      Result Value   CO2 19 (*)    Glucose, Bld 104 (*)    All other components within normal limits  URINALYSIS, W/ REFLEX TO CULTURE (INFECTION SUSPECTED) - Abnormal; Notable for the following components:   APPearance HAZY (*)    Hgb urine dipstick MODERATE (*)    Leukocytes,Ua SMALL (*)    All other components within normal limits  I-STAT CG4 LACTIC  ACID, ED - Abnormal; Notable for the following components:   Lactic Acid, Venous 2.1 (*)    All other components within normal limits  CBC WITH DIFFERENTIAL/PLATELET  HCG, SERUM, QUALITATIVE  I-STAT CG4 LACTIC ACID, ED    EKG: None  Radiology: No results found.   Procedures   Medications Ordered in the ED  sodium chloride  0.9 % bolus 1,000 mL (has no administration in time range)  acetaminophen  (TYLENOL ) tablet 650 mg (has no administration in time range)  fentaNYL  (SUBLIMAZE ) injection 50 mcg (has no administration in time range)  ondansetron  (ZOFRAN ) injection 4 mg (has no administration in time range)                                    Medical Decision Making Amount and/or Complexity of Data Reviewed Labs: ordered. Radiology: ordered and independent interpretation performed. ECG/medicine tests: ordered and independent interpretation performed.  Risk OTC drugs. Prescription drug management.   38 y.o. F here with fever/chills that developed today after cervical bx and conization 02/04/24 with Dr. Merced.   Denies cough or other URI symptoms.  No sick contacts.    Febrile here to 100.4, mild tachycardia.  She is non-toxic in appearance.  Lungs CTAB.  Abdomen soft without peritoneal signs.  Labs as above-- lactate 2.1 but normal WBC count.  No electrolyte derangement.  UA with some blood which is not entirely unexpected post-op.    11:21 PM Spoke with Dr. Verdon on call for Dr. Lovetta.  Recommends to obtain CTAP for further evaluation.  If no acute findings, stable for discharge to follow-up closely in clinic next week.  If any abnormal findings will call back and discuss next steps.  Requested to inbox her GYN and notify of results to review.  12:36 AM CT without acute findings-- no abscess, gas, free air, etc.  Patient feeling better.  HR has downtrended with IVF, fever controlled now. We did discuss obtaining CXR and RVP but currently without acute  symptoms.  She was comfortable holding off on this for now. Feel she is stable for discharge.  Cultures have been sent, will be notified if abnormal.  Will need to follow-up closely with her GYN-- I have inboxed her to facilitate expedited follow-up.  Given strict return precautions for any new/acute changes.  Final diagnoses:  Fever, unspecified fever cause    ED Discharge Orders     None          Jarold Olam HERO, PA-C 02/12/24 0134    Midge Golas, MD 02/12/24 [phone]

## 2024-02-11 NOTE — ED Triage Notes (Signed)
 Pt states pain is coming and going and feels similar to period cramps. Pt also states that she has had an odd vaginal odor since having biopsy. Denies any pain or difficulty with urinating.

## 2024-02-12 ENCOUNTER — Telehealth (HOSPITAL_COMMUNITY): Payer: Self-pay

## 2024-02-12 ENCOUNTER — Other Ambulatory Visit: Payer: Self-pay

## 2024-02-12 ENCOUNTER — Inpatient Hospital Stay (HOSPITAL_COMMUNITY)
Admission: EM | Admit: 2024-02-12 | Discharge: 2024-02-16 | DRG: 690 | Disposition: A | Discharge status: Home / Self Care | Attending: Internal Medicine | Admitting: Internal Medicine

## 2024-02-12 ENCOUNTER — Encounter (HOSPITAL_COMMUNITY): Payer: Self-pay | Admitting: Pharmacy Technician

## 2024-02-12 ENCOUNTER — Other Ambulatory Visit: Payer: Self-pay | Admitting: Obstetrics and Gynecology

## 2024-02-12 ENCOUNTER — Ambulatory Visit: Payer: Self-pay | Admitting: Obstetrics and Gynecology

## 2024-02-12 DIAGNOSIS — Z833 Family history of diabetes mellitus: Secondary | ICD-10-CM

## 2024-02-12 DIAGNOSIS — Z818 Family history of other mental and behavioral disorders: Secondary | ICD-10-CM

## 2024-02-12 DIAGNOSIS — N12 Tubulo-interstitial nephritis, not specified as acute or chronic: Principal | ICD-10-CM | POA: Diagnosis present

## 2024-02-12 DIAGNOSIS — Z881 Allergy status to other antibiotic agents status: Secondary | ICD-10-CM

## 2024-02-12 DIAGNOSIS — Z91048 Other nonmedicinal substance allergy status: Secondary | ICD-10-CM

## 2024-02-12 DIAGNOSIS — Z8249 Family history of ischemic heart disease and other diseases of the circulatory system: Secondary | ICD-10-CM

## 2024-02-12 DIAGNOSIS — R7881 Bacteremia: Principal | ICD-10-CM | POA: Diagnosis present

## 2024-02-12 DIAGNOSIS — F32A Depression, unspecified: Secondary | ICD-10-CM | POA: Diagnosis present

## 2024-02-12 DIAGNOSIS — C539 Malignant neoplasm of cervix uteri, unspecified: Secondary | ICD-10-CM | POA: Diagnosis present

## 2024-02-12 DIAGNOSIS — B961 Klebsiella pneumoniae [K. pneumoniae] as the cause of diseases classified elsewhere: Secondary | ICD-10-CM | POA: Diagnosis present

## 2024-02-12 DIAGNOSIS — Z808 Family history of malignant neoplasm of other organs or systems: Secondary | ICD-10-CM

## 2024-02-12 DIAGNOSIS — E785 Hyperlipidemia, unspecified: Secondary | ICD-10-CM | POA: Diagnosis present

## 2024-02-12 LAB — CBC WITH DIFFERENTIAL/PLATELET
Abs Immature Granulocytes: 0.03 K/uL (ref 0.00–0.07)
Basophils Absolute: 0 K/uL (ref 0.0–0.1)
Basophils Relative: 1 %
Eosinophils Absolute: 0.1 K/uL (ref 0.0–0.5)
Eosinophils Relative: 1 %
HCT: 40.7 % (ref 36.0–46.0)
Hemoglobin: 13.4 g/dL (ref 12.0–15.0)
Immature Granulocytes: 1 %
Lymphocytes Relative: 26 %
Lymphs Abs: 1.2 K/uL (ref 0.7–4.0)
MCH: 30 pg (ref 26.0–34.0)
MCHC: 32.9 g/dL (ref 30.0–36.0)
MCV: 91.3 fL (ref 80.0–100.0)
Monocytes Absolute: 0.4 K/uL (ref 0.1–1.0)
Monocytes Relative: 9 %
Neutro Abs: 2.9 K/uL (ref 1.7–7.7)
Neutrophils Relative %: 62 %
Platelets: 211 K/uL (ref 150–400)
RBC: 4.46 MIL/uL (ref 3.87–5.11)
RDW: 12.1 % (ref 11.5–15.5)
WBC: 4.7 K/uL (ref 4.0–10.5)
nRBC: 0 % (ref 0.0–0.2)

## 2024-02-12 LAB — URINALYSIS, W/ REFLEX TO CULTURE (INFECTION SUSPECTED)
Bilirubin Urine: NEGATIVE
Glucose, UA: NEGATIVE mg/dL
Ketones, ur: NEGATIVE mg/dL
Nitrite: NEGATIVE
Protein, ur: 100 mg/dL — AB
RBC / HPF: >50 RBC/hpf (ref 0–5)
Specific Gravity, Urine: 1.034 — ABNORMAL HIGH (ref 1.005–1.030)
pH: 5 (ref 5.0–8.0)

## 2024-02-12 LAB — BLOOD CULTURE ID PANEL (REFLEXED) - BCID2

## 2024-02-12 LAB — COMPREHENSIVE METABOLIC PANEL WITH GFR
ALT: 31 U/L (ref 0–44)
AST: 25 U/L (ref 15–41)
Albumin: 4.2 g/dL (ref 3.5–5.0)
Alkaline Phosphatase: 65 U/L (ref 38–126)
Anion gap: 12 (ref 5–15)
BUN: 12 mg/dL (ref 6–20)
CO2: 24 mmol/L (ref 22–32)
Calcium: 9.4 mg/dL (ref 8.9–10.3)
Chloride: 102 mmol/L (ref 98–111)
Creatinine, Ser: 0.64 mg/dL (ref 0.44–1.00)
GFR, Estimated: >60 mL/min (ref >60)
Glucose, Bld: 89 mg/dL (ref 70–99)
Potassium: 3.4 mmol/L — ABNORMAL LOW (ref 3.5–5.1)
Sodium: 138 mmol/L (ref 135–145)
Total Bilirubin: 0.6 mg/dL (ref 0.0–1.2)
Total Protein: 7.6 g/dL (ref 6.5–8.1)

## 2024-02-12 LAB — I-STAT CG4 LACTIC ACID, ED: Lactic Acid, Venous: 0.7 mmol/L (ref 0.5–1.9)

## 2024-02-12 LAB — HCG, SERUM, QUALITATIVE: Preg, Serum: NEGATIVE

## 2024-02-12 MED ORDER — MORPHINE SULFATE (PF) 4 MG/ML IV SOLN
4.0000 mg | Freq: Once | INTRAVENOUS | Status: AC
  Administered 2024-02-12: 4 mg via INTRAVENOUS
  Filled 2024-02-12: qty 1

## 2024-02-12 MED ORDER — IOHEXOL 350 MG/ML SOLN
75.0000 mL | Freq: Once | INTRAVENOUS | Status: AC | PRN
  Administered 2024-02-12: 75 mL via INTRAVENOUS

## 2024-02-12 MED ORDER — DIPHENHYDRAMINE HCL 25 MG PO CAPS
25.0000 mg | ORAL_CAPSULE | Freq: Once | ORAL | Status: AC
  Administered 2024-02-12: 25 mg via ORAL
  Filled 2024-02-12: qty 1

## 2024-02-12 MED ORDER — OXYCODONE HCL 5 MG PO TABS
5.0000 mg | ORAL_TABLET | Freq: Once | ORAL | Status: AC
  Administered 2024-02-12: 5 mg via ORAL
  Filled 2024-02-12: qty 1

## 2024-02-12 NOTE — ED Provider Triage Note (Signed)
 Emergency Medicine Provider Triage Evaluation Note  Loretta Burton , a 38 y.o. female  was evaluated in triage.  Pt complains of positive blood cultures.  Patient recently seen yesterday for lower abdominal pain as well as other symptoms and was called to report back to the hospital due to positive blood cultures.  Patient states that she has been experiencing persistent abdominal pain as well as fatigue and fevers with a max temperature today of 104.7.  Review of Systems  Positive: Fevers, positive blood cultures, abdominal pain Negative: Chest pain, shortness of breath  Physical Exam  BP (!) 145/82 (BP Location: Right Arm)   Pulse 77   Temp 99.1 F (37.3 C) (Oral)   Resp 18   SpO2 99%  Gen:   Awake, no distress   Resp:  Normal effort  MSK:   Moves extremities without difficulty  Other:  No obvious abnormality with auscultation of heart or lungs  Medical Decision Making  Medically screening exam initiated at 6:00 PM.  Appropriate orders placed.  Loretta Burton was informed that the remainder of the evaluation will be completed by another provider, this initial triage assessment does not replace that evaluation, and the importance of remaining in the ED until their evaluation is complete.  Orders: CBC, CMP, pregnancy test, lactic acid, UA, blood cultures x 2, Benadryl , oxycodone    Janetta Terrall FALCON, PA-C 02/12/24 1801

## 2024-02-12 NOTE — Discharge Instructions (Signed)
 CT today did not show any findings of abscess or other source of infection in the pelvis. Follow-up with Dr. Lovetta closely in clinic-- I have inboxed her about this visit to expedite follow-up. Tight fever control with tylenol /motrin , recommend alternate the 2 for best control. Return here for any new/acute changes-- severe pain, uncontrolled fever with medications, pus draining, etc.

## 2024-02-12 NOTE — ED Triage Notes (Signed)
 Pt here POV with reports of having +BC result. Pt endorses not feeling well, states she has a headache, has been running fevers. Generalized pain and some nausea. Tmax at home 104.47F.

## 2024-02-13 DIAGNOSIS — B961 Klebsiella pneumoniae [K. pneumoniae] as the cause of diseases classified elsewhere: Secondary | ICD-10-CM | POA: Diagnosis present

## 2024-02-13 DIAGNOSIS — Z881 Allergy status to other antibiotic agents status: Secondary | ICD-10-CM | POA: Diagnosis not present

## 2024-02-13 DIAGNOSIS — N12 Tubulo-interstitial nephritis, not specified as acute or chronic: Secondary | ICD-10-CM | POA: Diagnosis present

## 2024-02-13 DIAGNOSIS — C539 Malignant neoplasm of cervix uteri, unspecified: Secondary | ICD-10-CM | POA: Diagnosis present

## 2024-02-13 DIAGNOSIS — F32A Depression, unspecified: Secondary | ICD-10-CM | POA: Diagnosis present

## 2024-02-13 DIAGNOSIS — Z808 Family history of malignant neoplasm of other organs or systems: Secondary | ICD-10-CM | POA: Diagnosis not present

## 2024-02-13 DIAGNOSIS — Z818 Family history of other mental and behavioral disorders: Secondary | ICD-10-CM | POA: Diagnosis not present

## 2024-02-13 DIAGNOSIS — R7881 Bacteremia: Secondary | ICD-10-CM | POA: Diagnosis present

## 2024-02-13 DIAGNOSIS — Z91048 Other nonmedicinal substance allergy status: Secondary | ICD-10-CM | POA: Diagnosis not present

## 2024-02-13 DIAGNOSIS — E785 Hyperlipidemia, unspecified: Secondary | ICD-10-CM | POA: Diagnosis present

## 2024-02-13 DIAGNOSIS — Z8249 Family history of ischemic heart disease and other diseases of the circulatory system: Secondary | ICD-10-CM | POA: Diagnosis not present

## 2024-02-13 DIAGNOSIS — Z833 Family history of diabetes mellitus: Secondary | ICD-10-CM | POA: Diagnosis not present

## 2024-02-13 LAB — CBC
HCT: 33.2 % — ABNORMAL LOW (ref 36.0–46.0)
Hemoglobin: 11 g/dL — ABNORMAL LOW (ref 12.0–15.0)
MCH: 30.1 pg (ref 26.0–34.0)
MCHC: 33.1 g/dL (ref 30.0–36.0)
MCV: 91 fL (ref 80.0–100.0)
Platelets: 164 K/uL (ref 150–400)
RBC: 3.65 MIL/uL — ABNORMAL LOW (ref 3.87–5.11)
RDW: 12 % (ref 11.5–15.5)
WBC: 4.8 K/uL (ref 4.0–10.5)
nRBC: 0 % (ref 0.0–0.2)

## 2024-02-13 LAB — CREATININE, SERUM
Creatinine, Ser: 0.63 mg/dL (ref 0.44–1.00)
GFR, Estimated: >60 mL/min (ref >60)

## 2024-02-13 LAB — HIV ANTIBODY (ROUTINE TESTING W REFLEX): HIV Screen 4th Generation wRfx: NONREACTIVE

## 2024-02-13 MED ORDER — SODIUM CHLORIDE 0.9 % IV SOLN
2.0000 g | Freq: Once | INTRAVENOUS | Status: AC
  Administered 2024-02-13: 2 g via INTRAVENOUS
  Filled 2024-02-13: qty 20

## 2024-02-13 MED ORDER — PHENOL 1.4 % MT LIQD
1.0000 | OROMUCOSAL | Status: DC | PRN
  Administered 2024-02-13: 1 via OROMUCOSAL
  Filled 2024-02-13: qty 177

## 2024-02-13 MED ORDER — SODIUM CHLORIDE 0.9 % IV BOLUS
1000.0000 mL | Freq: Once | INTRAVENOUS | Status: AC
  Administered 2024-02-13: 1000 mL via INTRAVENOUS

## 2024-02-13 MED ORDER — KETOROLAC TROMETHAMINE 15 MG/ML IJ SOLN
15.0000 mg | Freq: Once | INTRAMUSCULAR | Status: AC
  Administered 2024-02-13: 15 mg via INTRAVENOUS
  Filled 2024-02-13: qty 1

## 2024-02-13 MED ORDER — ENOXAPARIN SODIUM 40 MG/0.4ML IJ SOSY
40.0000 mg | PREFILLED_SYRINGE | INTRAMUSCULAR | Status: DC
  Administered 2024-02-13 – 2024-02-14 (×2): 40 mg via SUBCUTANEOUS
  Filled 2024-02-13 (×2): qty 0.4

## 2024-02-13 MED ORDER — DIPHENHYDRAMINE HCL 12.5 MG/5ML PO ELIX
12.5000 mg | ORAL_SOLUTION | Freq: Four times a day (QID) | ORAL | Status: DC | PRN
  Administered 2024-02-13 – 2024-02-14 (×3): 12.5 mg via ORAL
  Filled 2024-02-13 (×3): qty 10

## 2024-02-13 MED ORDER — SODIUM CHLORIDE 0.9 % IV SOLN
2.0000 g | INTRAVENOUS | Status: DC
  Administered 2024-02-14 – 2024-02-16 (×3): 2 g via INTRAVENOUS
  Filled 2024-02-13 (×3): qty 20

## 2024-02-13 MED ORDER — SERTRALINE HCL 100 MG PO TABS
100.0000 mg | ORAL_TABLET | ORAL | Status: DC
  Administered 2024-02-14 – 2024-02-16 (×3): 100 mg via ORAL
  Filled 2024-02-13 (×3): qty 1

## 2024-02-13 MED ORDER — DIPHENHYDRAMINE HCL 50 MG/ML IJ SOLN
25.0000 mg | Freq: Once | INTRAMUSCULAR | Status: AC
  Administered 2024-02-13: 25 mg via INTRAVENOUS
  Filled 2024-02-13: qty 1

## 2024-02-13 MED ORDER — METOCLOPRAMIDE HCL 5 MG/ML IJ SOLN
10.0000 mg | Freq: Once | INTRAMUSCULAR | Status: AC
  Administered 2024-02-13: 10 mg via INTRAVENOUS
  Filled 2024-02-13: qty 2

## 2024-02-13 MED ORDER — METRONIDAZOLE 500 MG/100ML IV SOLN
500.0000 mg | Freq: Once | INTRAVENOUS | Status: AC
  Administered 2024-02-13: 500 mg via INTRAVENOUS
  Filled 2024-02-13: qty 100

## 2024-02-13 MED ORDER — OXYCODONE HCL 5 MG PO TABS
5.0000 mg | ORAL_TABLET | ORAL | Status: DC | PRN
  Administered 2024-02-13 – 2024-02-14 (×5): 5 mg via ORAL
  Filled 2024-02-13 (×5): qty 1

## 2024-02-13 MED ORDER — SODIUM CHLORIDE 0.9 % IV SOLN: INTRAVENOUS | Status: DC

## 2024-02-13 MED ORDER — OXYCODONE HCL 5 MG PO TABS
5.0000 mg | ORAL_TABLET | Freq: Once | ORAL | Status: AC
  Administered 2024-02-13: 5 mg via ORAL
  Filled 2024-02-13: qty 1

## 2024-02-13 NOTE — Progress Notes (Signed)
 PHARMACY - PHYSICIAN COMMUNICATION CRITICAL VALUE ALERT - BLOOD CULTURE IDENTIFICATION (BCID)  Loretta Burton is an 37 y.o. female who presented to [facility] on 02/12/2024 with a chief complaint of bacteremia.   Assessment: Blood cultures resulted 1/4 GNR. Per micro lab, no BCID ran due to previous culture growing klebsiella pneumoniae with no resistance identified on 10/24. Suspected urinary source.   Name of physician (or Provider) Contacted: Marsha Ada, MD  Current antibiotics: Ceftriaxone 2g q24h   Changes to prescribed antibiotics recommended:  Patient is on recommended antibiotics - No changes needed  Results for orders placed or performed during the hospital encounter of 02/11/24  Blood Culture ID Panel (Reflexed) (Collected: 02/11/2024 10:50 PM)  Result Value Ref Range   Enterococcus faecalis NOT DETECTED NOT DETECTED   Enterococcus Faecium NOT DETECTED NOT DETECTED   Listeria monocytogenes NOT DETECTED NOT DETECTED   Staphylococcus species NOT DETECTED NOT DETECTED   Staphylococcus aureus (BCID) NOT DETECTED NOT DETECTED   Staphylococcus epidermidis NOT DETECTED NOT DETECTED   Staphylococcus lugdunensis NOT DETECTED NOT DETECTED   Streptococcus species NOT DETECTED NOT DETECTED   Streptococcus agalactiae NOT DETECTED NOT DETECTED   Streptococcus pneumoniae NOT DETECTED NOT DETECTED   Streptococcus pyogenes NOT DETECTED NOT DETECTED   A.calcoaceticus-baumannii NOT DETECTED NOT DETECTED   Bacteroides fragilis NOT DETECTED NOT DETECTED   Enterobacterales DETECTED (A) NOT DETECTED   Enterobacter cloacae complex NOT DETECTED NOT DETECTED   Escherichia coli NOT DETECTED NOT DETECTED   Klebsiella aerogenes NOT DETECTED NOT DETECTED   Klebsiella oxytoca NOT DETECTED NOT DETECTED   Klebsiella pneumoniae DETECTED (A) NOT DETECTED   Proteus species NOT DETECTED NOT DETECTED   Salmonella species NOT DETECTED NOT DETECTED   Serratia marcescens NOT DETECTED NOT DETECTED    Haemophilus influenzae NOT DETECTED NOT DETECTED   Neisseria meningitidis NOT DETECTED NOT DETECTED   Pseudomonas aeruginosa NOT DETECTED NOT DETECTED   Stenotrophomonas maltophilia NOT DETECTED NOT DETECTED   Candida albicans NOT DETECTED NOT DETECTED   Candida auris NOT DETECTED NOT DETECTED   Candida glabrata NOT DETECTED NOT DETECTED   Candida krusei NOT DETECTED NOT DETECTED   Candida parapsilosis NOT DETECTED NOT DETECTED   Candida tropicalis NOT DETECTED NOT DETECTED   Cryptococcus neoformans/gattii NOT DETECTED NOT DETECTED   CTX-M ESBL NOT DETECTED NOT DETECTED   Carbapenem resistance IMP NOT DETECTED NOT DETECTED   Carbapenem resistance KPC NOT DETECTED NOT DETECTED   Carbapenem resistance NDM NOT DETECTED NOT DETECTED   Carbapenem resist OXA 48 LIKE NOT DETECTED NOT DETECTED   Carbapenem resistance VIM NOT DETECTED NOT DETECTED    Shariff Lasky M Gwenetta Devos 02/13/2024  2:59 PM

## 2024-02-13 NOTE — H&P (Signed)
 History and Physical    PatientBETHA Burton Burton FMW:[id number] DOB: 07-28-1985 DOA: 02/12/2024 DOS: the patient was seen and examined on 02/13/2024 PCP: Gretta Comer POUR, NP  Patient coming from: Home  Chief Complaint:  Chief Complaint  Patient presents with   Abnormal Lab   HPI: Loretta Burton is a 38 y.o. 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  The patient was at the beginning stages of cervical cancer and had undergone a second surgical procedure last Friday for colonization. She noticed increased discomfort post-surgery as her doctor indicated a larger area had been excised. Initially, she managed pain with ibuprofen  and Tylenol , and occasionally used oxycodone . However, this time, she required more frequent doses of oxycodone , even doubling the dosage at one point. Despite this, she did not feel well. On Friday night, she experienced fever, prompting her to visit the hospital as advised post-surgery if fever occurred. Initial evaluations, including blood cultures, seemed normal, and she was told she might just need rest and sent home. However, subsequent communication from the hospital indicated growth in her blood cultures, signifying a bad infection, and she was asked to return and plan for admission.  In the ED, pt AFVSS. Labs unremarkable (lactic acid 0.7; previousy 2.1 on 10/24). UA with pyuria and bacteruria.EDP started IV CTX and requested medicine admission.    Review of Systems: As mentioned in the history of present illness. All other systems reviewed and are negative. Past Medical History:  Diagnosis Date   Abscess of nose 09/01/2022   Anemia    Cervical adenocarcinoma (HCC)    Cholelithiasis    Decreased fetus movements affecting management of mother in third trimester 08/12/2017   Depression    Depression affecting  pregnancy 12/24/2016   GERD (gastroesophageal reflux disease)    Hyperlipidemia    Insomnia    Obesity affecting pregnancy 12/24/2016   Panic attack    Periorbital cellulitis of left eye    Seasonal allergies    Supervision of high-risk pregnancy 12/24/2016   Vitamin B12 deficiency    Past Surgical History:  Procedure Laterality Date   CERVICAL CONIZATION W/BX N/A 12/03/2023   Procedure: CONE BIOPSY, CERVIX;  Surgeon: Lovetta Beverli GAILS, MD;  Location: [facility] ORS;  Service: Gynecology;  Laterality: N/A;  COLD KNIFE CONIZATION , ENDOCERVICAL CURETTAGE   CERVICAL CONIZATION W/BX N/A 02/04/2024   Procedure: CONE BIOPSY, CERVIX;  Surgeon: Lovetta Beverli GAILS, MD;  Location: [facility] ORS;  Service: Gynecology;  Laterality: N/A;   CHOLECYSTECTOMY N/A 11/28/2014   Procedure: LAPAROSCOPIC CHOLECYSTECTOMY WITH INTRAOPERATIVE CHOLANGIOGRAM;  Surgeon: Charlie FORBES Fell, MD;  Location: [facility] ORS;  Service: General;  Laterality: N/A;   DILATION AND CURETTAGE OF UTERUS  02/04/2024   Procedure: ENDOCERVICAL CURETTAGE;  Surgeon: Schermerhorn, Beverli GAILS, MD;  Location: [facility] ORS;  Service: Gynecology;;   WISDOM TOOTH EXTRACTION     x4   Social History:  reports that she has never smoked. She has never used smokeless tobacco. She reports that she does not drink alcohol and does not use drugs.  Allergies  Allergen Reactions   Adhesive [Tape] Itching   Ciprofloxacin  Hives and Nausea And Vomiting    Family History  Problem Relation Age of Onset   Diabetes Mother    Hypertension Mother    Depression Mother    Diabetes Maternal Grandmother    Hypertension Maternal Grandmother  Heart disease Maternal Grandmother    Cancer Maternal Grandmother        skin   Diabetes Paternal Grandmother    Hypertension Paternal Grandmother    Heart disease Paternal Grandmother    Diabetes Paternal Grandfather    Hypertension Paternal Grandfather    Heart disease Paternal Grandfather     Prior to Admission  medications   Medication Sig Start Date End Date Taking? Authorizing Provider  Cholecalciferol (VITAMIN D-3) 125 MCG (5000 UT) TABS Take 5,000 Units by mouth in the morning.   Yes [provider]  Cyanocobalamin  (B-12) 5000 MCG CAPS Take 5,000 mcg by mouth daily.   Yes [provider]  etonogestrel (NEXPLANON) 68 MG IMPL implant 1 each by Subdermal route once.   Yes [provider]  hydrOXYzine  (ATARAX ) 10 MG tablet Take 1 tablet (10 mg total) by mouth at bedtime as needed for anxiety. 12/31/23  Yes Clark, Katherine K, NP  ibuprofen  (ADVIL ) 800 MG tablet Take 800 mg by mouth every 8 (eight) hours. 01/29/24  Yes [provider]  Melatonin 10 MG TABS Take 10 mg by mouth at bedtime.   Yes [provider]  ondansetron  (ZOFRAN ) 4 MG tablet Take 4 mg by mouth every 8 (eight) hours as needed. 01/29/24  Yes [provider]  Probiotic Product (PROBIOTIC PO) Take 1 capsule by mouth in the morning.   Yes [provider]  sertraline  (ZOLOFT ) 100 MG tablet TAKE 1 TABLET BY MOUTH ONCE DAILY FOR ANXIETY AND FOR DEPRESSION Patient taking differently: Take 100 mg by mouth every morning. 11/05/23  Yes Clark, Katherine K, NP  oxyCODONE  (OXY IR/ROXICODONE ) 5 MG immediate release tablet Take 5 mg by mouth every 6 (six) hours as needed (for pain). Patient not taking: Reported on 02/13/2024 02/12/24 02/17/24  [provider]    Physical Exam: Vitals:   02/12/24 2056 02/13/24 0138 02/13/24 0559 02/13/24 1105  BP: 116/74 131/68 (!) 107/41   Pulse: 77 86 91   Resp: 16 18 16    Temp: 98.5 F (36.9 C) 99.9 F (37.7 C) 99.6 F (37.6 C)   TempSrc:   Oral   SpO2: 97% 98% 99%   Weight:    102 kg  Height:    5' 4 (1.626 m)   General: Alert, oriented x3, resting comfortably in no acute distress Respiratory: Lungs clear to auscultation bilaterally with normal respiratory effort; no w/r/r Cardiovascular: Regular rate and rhythm w/o m/r/g Abdomen:  Soft, nontender, nondistended. Positive bowel sounds   Data Reviewed:  Lab Results  Component Value Date   WBC 4.8 02/13/2024   HGB 11.0 (L) 02/13/2024   HCT 33.2 (L) 02/13/2024   MCV 91.0 02/13/2024   PLT 164 02/13/2024   Lab Results  Component Value Date   GLUCOSE 89 02/12/2024   CALCIUM 9.4 02/12/2024   NA 138 02/12/2024   K 3.4 (L) 02/12/2024   CO2 24 02/12/2024   CL 102 02/12/2024   BUN 12 02/12/2024   CREATININE 0.63 02/13/2024   Lab Results  Component Value Date   ALT 31 02/12/2024   AST 25 02/12/2024   ALKPHOS 65 02/12/2024   BILITOT 0.6 02/12/2024   No results found for: INR, PROTIME Radiology: No results found.  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  Positive blood cultures S/p recent CKC Possible contaminant; 1/4 blood cultures positive for GNR  on 10/24 and 10/25; will continue IV CTX until cultures clear and have OB-GYN eval -OB-GYN consulted; apprec eval/recs -IV CTX daily for now -MIVF: NS at 100cc/h for now -F/u blood cultures daily until clear -Consider ID consult or IV abx x7 days if considered true GNR bacteremia  Acute cystits UA with pyuria and bacteruria -IV CTX daily per above -F/u urine culture  Depression -PTA Zoloft  100mg  daily   Advance Care Planning:   Code Status: Full Code   Consults: OB-GYN  Family Communication: Husband  Severity of Illness: The appropriate patient status for this patient is INPATIENT. Inpatient status is judged to be reasonable and necessary in order to provide the required intensity of service to ensure the patient's safety. The patient's presenting symptoms, physical exam findings, and initial radiographic and laboratory data in the context of their chronic comorbidities is felt to place them at high risk for further clinical deterioration. Furthermore, it is not  anticipated that the patient will be medically stable for discharge from the hospital within 2 midnights of admission.   * I certify that at the point of admission it is my clinical judgment that the patient will require inpatient hospital care spanning beyond 2 midnights from the point of admission due to high intensity of service, high risk for further deterioration and high frequency of surveillance required.*   ------- I spent 60 minutes reviewing previous notes, at the bedside counseling/discussing the treatment plan, and performing clinical documentation.  Author: Marsha Ada, MD 02/13/2024 11:45 AM  For on call review [email or web address].

## 2024-02-13 NOTE — ED Notes (Signed)
 Patient requesting pain meds but needs benadryl  to counteract the itching. Admitting MD Georgina notified, RN waiting on orders.

## 2024-02-13 NOTE — ED Provider Notes (Signed)
 [facility] AT [facility] Provider Note   CSN: [id number] Arrival date & time: 02/12/24  1652     Patient presents with: Abnormal Lab   Loretta Burton is a 38 y.o. female.   38 yo F with a chief complaints of pelvic pain fever is about a week post conization of the cervix.  Had been seen now couple days in a row in the emergency department.  Had blood cultures from 2 days ago that have resulted with Gram negative rods.  Positive for Enterobacterales she was called back with continued fevers to come to the Emergency Department for IV antibiotics.   Abnormal Lab      Prior to Admission medications   Medication Sig Start Date End Date Taking? Authorizing Provider  Cholecalciferol (VITAMIN D-3) 125 MCG (5000 UT) TABS Take 5,000 Units by mouth in the morning.    [provider]  Cyanocobalamin  (B-12) 5000 MCG CAPS Take 5,000 mcg by mouth daily.    [provider]  etonogestrel (NEXPLANON) 68 MG IMPL implant 1 each by Subdermal route once.    [provider]  fluticasone  (FLONASE ) 50 MCG/ACT nasal spray Place 2 sprays into both nostrils daily as needed for allergies.    [provider]  hydrOXYzine  (ATARAX ) 10 MG tablet Take 1 tablet (10 mg total) by mouth at bedtime as needed for anxiety. 12/31/23   Clark, Katherine K, NP  Probiotic Product (PROBIOTIC PO) Take 1 capsule by mouth in the morning.    [provider]  sertraline  (ZOLOFT ) 100 MG tablet TAKE 1 TABLET BY MOUTH ONCE DAILY FOR ANXIETY AND FOR DEPRESSION Patient taking differently: Take 100 mg by mouth every morning. 11/05/23   Gretta Comer POUR, NP    Allergies: Adhesive [tape] and Ciprofloxacin     Review of Systems  Updated Vital Signs BP (!) 107/41 (BP Location: Right Arm)   Pulse 91   Temp 99.6 F (37.6 C) (Oral)   Resp 16   SpO2 99%   Physical Exam Vitals and nursing note reviewed.  Constitutional:      General: She is not in acute  distress.    Appearance: She is well-developed. She is not diaphoretic.  HENT:     Head: Normocephalic and atraumatic.  Eyes:     Pupils: Pupils are equal, round, and reactive to light.  Cardiovascular:     Rate and Rhythm: Normal rate and regular rhythm.     Heart sounds: No murmur heard.    No friction rub. No gallop.  Pulmonary:     Effort: Pulmonary effort is normal.     Breath sounds: No wheezing or rales.  Abdominal:     General: There is no distension.     Palpations: Abdomen is soft.     Tenderness: There is no abdominal tenderness.  Musculoskeletal:        General: No tenderness.     Cervical back: Normal range of motion and neck supple.  Skin:    General: Skin is warm and dry.  Neurological:     Mental Status: She is alert and oriented to person, place, and time.  Psychiatric:        Behavior: Behavior normal.     (all labs ordered are listed, but only abnormal results are displayed) Labs Reviewed  COMPREHENSIVE METABOLIC PANEL WITH GFR - Abnormal; Notable for the following components:      Result Value   Potassium 3.4 (*)    All other components  within normal limits  URINALYSIS, W/ REFLEX TO CULTURE (INFECTION SUSPECTED) - Abnormal; Notable for the following components:   Color, Urine AMBER (*)    APPearance CLOUDY (*)    Specific Gravity, Urine 1.034 (*)    Hgb urine dipstick LARGE (*)    Protein, ur 100 (*)    Leukocytes,Ua SMALL (*)    Bacteria, UA MANY (*)    All other components within normal limits  CULTURE, BLOOD (ROUTINE X 2)  CULTURE, BLOOD (ROUTINE X 2)  CBC WITH DIFFERENTIAL/PLATELET  HCG, SERUM, QUALITATIVE  I-STAT CG4 LACTIC ACID, ED    EKG: None  Radiology: CT ABDOMEN PELVIS W CONTRAST Result Date: 02/12/2024 CLINICAL DATA:  Fever postop pain EXAM: CT ABDOMEN AND PELVIS WITH CONTRAST TECHNIQUE: Multidetector CT imaging of the abdomen and pelvis was performed using the standard protocol following bolus administration of intravenous  contrast. RADIATION DOSE REDUCTION: This exam was performed according to the departmental dose-optimization program which includes automated exposure control, adjustment of the mA and/or kV according to patient size and/or use of iterative reconstruction technique. CONTRAST:  75mL OMNIPAQUE IOHEXOL 350 MG/ML SOLN COMPARISON:  None Available. FINDINGS: Lower chest: Lung bases demonstrate no acute airspace disease. Hepatobiliary: Liver is enlarged, craniocaudal measurement of 23 cm. Fatty infiltration. No focal hepatic abnormality. Cholecystectomy. No biliary dilatation Pancreas: Unremarkable. No pancreatic ductal dilatation or surrounding inflammatory changes. Spleen: Normal in size without focal abnormality. Adrenals/Urinary Tract: Adrenal glands are normal. Kidneys show no hydronephrosis. The bladder is unremarkable. Punctate right kidney stone Stomach/Bowel: Stomach within normal limits. No acute bowel wall thickening. Diverticular disease of the colon. Negative appendix Vascular/Lymphatic: No significant vascular findings are present. No enlarged abdominal or pelvic lymph nodes. Reproductive: No suspicious adnexal mass. Heterogenous material within the cervix. Other: No abdominal wall hernia or abnormality. No abdominopelvic ascites. Musculoskeletal: None IMPRESSION: 1. No CT evidence for acute intra-abdominal or pelvic abnormality. Heterogenous material in the cervix, may represent surgical material. No evidence for rim enhancing pelvic abscess. No gas within the endometrial cavity superior to the cervical findings. 2. Hepatomegaly with fatty infiltration of the liver. 3. Diverticular disease of the colon without acute inflammatory process. 4. Nonobstructing right kidney stone Electronically Signed   By: Luke Bun M.D.   On: 02/12/2024 00:22     Procedures   Medications Ordered in the ED  metroNIDAZOLE  (FLAGYL ) IVPB 500 mg (500 mg Intravenous New Bag/Given 02/13/24 0554)  cefTRIAXone (ROCEPHIN) 2 g  in sodium chloride  0.9 % 100 mL IVPB (has no administration in time range)  oxyCODONE  (Oxy IR/ROXICODONE ) immediate release tablet 5 mg (5 mg Oral Given 02/12/24 1810)  diphenhydrAMINE  (BENADRYL ) capsule 25 mg (25 mg Oral Given 02/12/24 1810)  cefTRIAXone (ROCEPHIN) 2 g in sodium chloride  0.9 % 100 mL IVPB (0 g Intravenous Stopped 02/13/24 0549)  sodium chloride  0.9 % bolus 1,000 mL (0 mLs Intravenous Stopped 02/13/24 0631)  ketorolac  (TORADOL ) 15 MG/ML injection 15 mg (15 mg Intravenous Given 02/13/24 0514)  metoCLOPramide (REGLAN) injection 10 mg (10 mg Intravenous Given 02/13/24 0549)  diphenhydrAMINE  (BENADRYL ) injection 25 mg (25 mg Intravenous Given 02/13/24 0549)  oxyCODONE  (Oxy IR/ROXICODONE ) immediate release tablet 5 mg (5 mg Oral Given 02/13/24 0631)                                    Medical Decision Making Risk Prescription drug management. Decision regarding hospitalization.   38 yo F with a chief  complaints of fever and pelvic pain.  Patient had a procedure done in [place] about a week ago.  Has been following up with her GYN clinic.  Has concern for cervical cancer on biopsy.  Here with positive blood culture.  I discussed the case with Danielle Wilson, CNM on-call for her OB/GYN clinic.  She thought unlikely to be related to the procedure this far out.  Did feel reasonable for admission for IV antibiotics.  UA with possible UTI.  Perhaps the patient has Pylo.  Will cover with Rocephin and Flagyl .  Will discuss with medicine.  The patients results and plan were reviewed and discussed.   Any x-rays performed were independently reviewed by myself.   Differential diagnosis were considered with the presenting HPI.  Medications  metroNIDAZOLE  (FLAGYL ) IVPB 500 mg (500 mg Intravenous New Bag/Given 02/13/24 0554)  cefTRIAXone (ROCEPHIN) 2 g in sodium chloride  0.9 % 100 mL IVPB (has no administration in time range)  oxyCODONE  (Oxy IR/ROXICODONE ) immediate release tablet 5  mg (5 mg Oral Given 02/12/24 1810)  diphenhydrAMINE  (BENADRYL ) capsule 25 mg (25 mg Oral Given 02/12/24 1810)  cefTRIAXone (ROCEPHIN) 2 g in sodium chloride  0.9 % 100 mL IVPB (0 g Intravenous Stopped 02/13/24 0549)  sodium chloride  0.9 % bolus 1,000 mL (0 mLs Intravenous Stopped 02/13/24 0631)  ketorolac  (TORADOL ) 15 MG/ML injection 15 mg (15 mg Intravenous Given 02/13/24 0514)  metoCLOPramide (REGLAN) injection 10 mg (10 mg Intravenous Given 02/13/24 0549)  diphenhydrAMINE  (BENADRYL ) injection 25 mg (25 mg Intravenous Given 02/13/24 0549)  oxyCODONE  (Oxy IR/ROXICODONE ) immediate release tablet 5 mg (5 mg Oral Given 02/13/24 0631)    Vitals:   02/12/24 1700 02/12/24 2056 02/13/24 0138 02/13/24 0559  BP:  116/74 131/68 (!) 107/41  Pulse:  77 86 91  Resp:  16 18 16   Temp: 99.1 F (37.3 C) 98.5 F (36.9 C) 99.9 F (37.7 C) 99.6 F (37.6 C)  TempSrc: Oral   Oral  SpO2:  97% 98% 99%    Final diagnoses:  Bacteremia    Admission/ observation were discussed with the admitting physician, patient and/or family and they are comfortable with the plan.       Final diagnoses:  Bacteremia    ED Discharge Orders     None          Emil Share, [place] 02/13/24 [phone]

## 2024-02-14 ENCOUNTER — Telehealth: Payer: Self-pay

## 2024-02-14 DIAGNOSIS — R7881 Bacteremia: Secondary | ICD-10-CM | POA: Diagnosis not present

## 2024-02-14 DIAGNOSIS — B961 Klebsiella pneumoniae [K. pneumoniae] as the cause of diseases classified elsewhere: Secondary | ICD-10-CM | POA: Diagnosis not present

## 2024-02-14 LAB — CULTURE, BLOOD (ROUTINE X 2): Special Requests: ADEQUATE

## 2024-02-14 LAB — CBC
HCT: 34.2 % — ABNORMAL LOW (ref 36.0–46.0)
Hemoglobin: 11.5 g/dL — ABNORMAL LOW (ref 12.0–15.0)
MCH: 30.3 pg (ref 26.0–34.0)
MCHC: 33.6 g/dL (ref 30.0–36.0)
MCV: 90 fL (ref 80.0–100.0)
Platelets: 182 K/uL (ref 150–400)
RBC: 3.8 MIL/uL — ABNORMAL LOW (ref 3.87–5.11)
RDW: 12 % (ref 11.5–15.5)
WBC: 4.5 K/uL (ref 4.0–10.5)
nRBC: 0 % (ref 0.0–0.2)

## 2024-02-14 LAB — BASIC METABOLIC PANEL WITH GFR
Anion gap: 11 (ref 5–15)
BUN: 6 mg/dL (ref 6–20)
CO2: 23 mmol/L (ref 22–32)
Calcium: 8.8 mg/dL — ABNORMAL LOW (ref 8.9–10.3)
Chloride: 105 mmol/L (ref 98–111)
Creatinine, Ser: 0.56 mg/dL (ref 0.44–1.00)
GFR, Estimated: >60 mL/min (ref >60)
Glucose, Bld: 106 mg/dL — ABNORMAL HIGH (ref 70–99)
Potassium: 3.7 mmol/L (ref 3.5–5.1)
Sodium: 139 mmol/L (ref 135–145)

## 2024-02-14 MED ORDER — HYDROMORPHONE HCL 2 MG PO TABS
1.0000 mg | ORAL_TABLET | ORAL | Status: DC | PRN
  Administered 2024-02-14 – 2024-02-15 (×5): 1 mg via ORAL
  Filled 2024-02-14 (×5): qty 1

## 2024-02-14 MED ORDER — OXYCODONE HCL 5 MG PO TABS: 5.0000 mg | ORAL_TABLET | ORAL | Status: DC | PRN

## 2024-02-14 MED ORDER — HYDROMORPHONE HCL 1 MG/ML IJ SOLN: 0.5000 mg | INTRAMUSCULAR | Status: DC | PRN

## 2024-02-14 MED ORDER — SODIUM CHLORIDE 0.9 % IV SOLN
12.5000 mg | Freq: Four times a day (QID) | INTRAVENOUS | Status: DC | PRN
  Administered 2024-02-14: 12.5 mg via INTRAVENOUS
  Filled 2024-02-14: qty 0.5

## 2024-02-14 MED ORDER — ENOXAPARIN SODIUM 60 MG/0.6ML IJ SOSY
50.0000 mg | PREFILLED_SYRINGE | INTRAMUSCULAR | Status: DC
  Administered 2024-02-15 – 2024-02-16 (×2): 50 mg via SUBCUTANEOUS
  Filled 2024-02-14 (×2): qty 0.6

## 2024-02-14 MED ORDER — HYDROXYZINE HCL 10 MG PO TABS: 10.0000 mg | ORAL_TABLET | Freq: Every evening | ORAL | Status: DC | PRN

## 2024-02-14 MED ORDER — ONDANSETRON HCL 4 MG/2ML IJ SOLN
4.0000 mg | Freq: Four times a day (QID) | INTRAMUSCULAR | Status: DC | PRN
  Administered 2024-02-14: 4 mg via INTRAVENOUS
  Filled 2024-02-14: qty 2

## 2024-02-14 MED ORDER — MELATONIN 5 MG PO TABS
10.0000 mg | ORAL_TABLET | Freq: Every day | ORAL | Status: DC
  Administered 2024-02-15: 10 mg via ORAL
  Filled 2024-02-14: qty 2

## 2024-02-14 MED ORDER — ACETAMINOPHEN 325 MG PO TABS
650.0000 mg | ORAL_TABLET | Freq: Four times a day (QID) | ORAL | Status: DC | PRN
  Administered 2024-02-14 – 2024-02-15 (×2): 650 mg via ORAL
  Filled 2024-02-14 (×2): qty 2

## 2024-02-14 MED ORDER — VITAMIN D 25 MCG (1000 UNIT) PO TABS
5000.0000 [IU] | ORAL_TABLET | Freq: Every morning | ORAL | Status: DC
  Administered 2024-02-14 – 2024-02-16 (×3): 5000 [IU] via ORAL
  Filled 2024-02-14 (×3): qty 5

## 2024-02-14 MED ORDER — VITAMIN B-12 1000 MCG PO TABS
5000.0000 ug | ORAL_TABLET | Freq: Every day | ORAL | Status: DC
  Administered 2024-02-14 – 2024-02-16 (×3): 5000 ug via ORAL
  Filled 2024-02-14 (×3): qty 5

## 2024-02-14 MED ORDER — MORPHINE SULFATE (PF) 2 MG/ML IV SOLN: 2.0000 mg | INTRAVENOUS | Status: DC | PRN

## 2024-02-14 NOTE — Progress Notes (Signed)
 Loretta Burton  FMW:[id number] DOB: April 22, 1985 DOA: 02/12/2024 PCP: Gretta Comer POUR, NP    Brief Narrative:  38 year old with a history of cervical adenocarcinoma in situ, GERD and depression who underwent cold knife cervical conization on 02/04/2024 (with a hx of a prior CKC Aug 2025) who reported to the ER 10/24 with significant pelvic pain a week following her surgery at which time CT abdomen pelvis with and without acute findings and the patient was discharged for outpatient follow-up.  She returned to the ER the following day (10/25) after she was informed that she had positive blood cultures (gram negative rods).  She had noted a fever up to 104 at home.  In the ER UA was abnormal.  Empiric antibiotic was initiated.  Goals of Care:   Code Status: Full Code   DVT prophylaxis: enoxaparin (LOVENOX) injection 40 mg Start: 02/13/24 0845   Interim Hx: Afebrile since admission.  Vital signs stable.  Reports ongoing pelvic pain.  No chest pain or shortness of breath.  Poor appetite/limited intake.  Assessment & Plan:  Gram-negative rod bacteremia - UTI Blood cx from 10/25 1/2 + for gram neg rods, as were blood cx 10/24 - UA markedly abnormal - CT abd/pelvis 10/24 without findings worrisome for an acute intrabdominal/pelvic issue/infection -case discussed with OB via EDP who did not feel this likely represented a surgical issue/complication - cont to tx as a pyelonephritis/sever UTI and follow clinically - plan for a full 10 days of abx tx, with change to oral once clearly making progress   Cervical adenocarcinoma in situ status post cold knife cervical conization 02/04/2024 Follow-up with GYN as outpatient as previously scheduled  Depression Continue usual Zoloft    Family Communication: No family present at time of exam Disposition: Anticipate discharge home when clinically stable   Objective: Blood pressure 121/79, pulse 65, temperature 98.2 F (36.8 C), temperature source  Oral, resp. rate 14, height 5' 4 (1.626 m), weight 102 kg, SpO2 100%.  Intake/Output Summary (Last 24 hours) at 02/14/2024 0955 Last data filed at 02/13/2024 1756 Gross per 24 hour  Intake 860.86 ml  Output --  Net 860.86 ml   Filed Weights   02/13/24 1105  Weight: 102 kg    Examination: General: No acute respiratory distress Lungs: Clear to auscultation bilaterally without wheezes or crackles Cardiovascular: Regular rate and rhythm without murmur gallop or rub normal S1 and S2 Abdomen: Tender diffusely across the lower quadrants, no rebound, bowel sounds positive Extremities: No significant cyanosis, clubbing, or edema bilateral lower extremities  CBC: Recent Labs  Lab 02/11/24 2143 02/12/24 1810 02/13/24 0850 02/14/24 0558  WBC 9.2 4.7 4.8 4.5  NEUTROABS 6.4 2.9  --   --   HGB 13.6 13.4 11.0* 11.5*  HCT 40.0 40.7 33.2* 34.2*  MCV 89.7 91.3 91.0 90.0  PLT 261 211 164 182   Basic Metabolic Panel: Recent Labs  Lab 02/11/24 2143 02/12/24 1810 02/13/24 0850 02/14/24 0558  NA 137 138  --  139  K 3.7 3.4*  --  3.7  CL 103 102  --  105  CO2 19* 24  --  23  GLUCOSE 104* 89  --  106*  BUN 11 12  --  6  CREATININE 0.66 0.64 0.63 0.56  CALCIUM 10.0 9.4  --  8.8*   GFR: Estimated Creatinine Clearance: 113 mL/min (by C-G formula based on SCr of 0.56 mg/dL).   Scheduled Meds:  enoxaparin (LOVENOX) injection  40 mg Subcutaneous Q24H  sertraline   100 mg Oral [id number]   Continuous Infusions:  cefTRIAXone (ROCEPHIN)  IV 2 g (02/14/24 0516)     LOS: 1 day   [name], MD [facility] Office  [phone] Pager - Text Page per Amion  If 7PM-7AM, please contact night-coverage per Amion 02/14/2024, 9:55 AM

## 2024-02-14 NOTE — Plan of Care (Signed)
   Problem: Education: Goal: Knowledge of General Education information will improve Description: Including pain rating scale, medication(s)/side effects and non-pharmacologic comfort measures Outcome: Progressing   Problem: Health Behavior/Discharge Planning: Goal: Ability to manage health-related needs will improve Outcome: Progressing   Problem: Activity: Goal: Risk for activity intolerance will decrease Outcome: Progressing   Problem: Nutrition: Goal: Adequate nutrition will be maintained Outcome: Progressing   Problem: Coping: Goal: Level of anxiety will decrease Outcome: Progressing   Problem: Pain Managment: Goal: General experience of comfort will improve and/or be controlled Outcome: Progressing   Problem: Safety: Goal: Ability to remain free from injury will improve Outcome: Progressing   Problem: Skin Integrity: Goal: Risk for impaired skin integrity will decrease Outcome: Progressing

## 2024-02-14 NOTE — Telephone Encounter (Signed)
 Gyn Onc referral forwarded to [place]. Pathology review at [facility] requested on specimen [id number], collected 12/03/2023 and [id number], collected 02/04/24.

## 2024-02-15 ENCOUNTER — Telehealth (HOSPITAL_BASED_OUTPATIENT_CLINIC_OR_DEPARTMENT_OTHER): Payer: Self-pay | Admitting: *Deleted

## 2024-02-15 DIAGNOSIS — R7881 Bacteremia: Secondary | ICD-10-CM | POA: Diagnosis not present

## 2024-02-15 DIAGNOSIS — B961 Klebsiella pneumoniae [K. pneumoniae] as the cause of diseases classified elsewhere: Secondary | ICD-10-CM | POA: Diagnosis not present

## 2024-02-15 LAB — BASIC METABOLIC PANEL WITH GFR
Anion gap: 10 (ref 5–15)
BUN: 8 mg/dL (ref 6–20)
CO2: 22 mmol/L (ref 22–32)
Calcium: 8.9 mg/dL (ref 8.9–10.3)
Chloride: 107 mmol/L (ref 98–111)
Creatinine, Ser: 0.71 mg/dL (ref 0.44–1.00)
GFR, Estimated: >60 mL/min (ref >60)
Glucose, Bld: 113 mg/dL — ABNORMAL HIGH (ref 70–99)
Potassium: 3.9 mmol/L (ref 3.5–5.1)
Sodium: 139 mmol/L (ref 135–145)

## 2024-02-15 LAB — CBC
HCT: 34.8 % — ABNORMAL LOW (ref 36.0–46.0)
Hemoglobin: 11.7 g/dL — ABNORMAL LOW (ref 12.0–15.0)
MCH: 30.4 pg (ref 26.0–34.0)
MCHC: 33.6 g/dL (ref 30.0–36.0)
MCV: 90.4 fL (ref 80.0–100.0)
Platelets: 191 K/uL (ref 150–400)
RBC: 3.85 MIL/uL — ABNORMAL LOW (ref 3.87–5.11)
RDW: 11.9 % (ref 11.5–15.5)
WBC: 5.4 K/uL (ref 4.0–10.5)
nRBC: 0 % (ref 0.0–0.2)

## 2024-02-15 MED ORDER — BUTALBITAL-APAP-CAFFEINE 50-325-40 MG PO TABS
1.0000 | ORAL_TABLET | Freq: Four times a day (QID) | ORAL | Status: DC | PRN
  Administered 2024-02-15: 1 via ORAL
  Administered 2024-02-15: 2 via ORAL
  Filled 2024-02-15: qty 2
  Filled 2024-02-15: qty 1

## 2024-02-15 MED ORDER — ACETAMINOPHEN 325 MG PO TABS
650.0000 mg | ORAL_TABLET | Freq: Four times a day (QID) | ORAL | Status: DC | PRN
  Administered 2024-02-16: 650 mg via ORAL
  Filled 2024-02-15 (×2): qty 2

## 2024-02-15 NOTE — Telephone Encounter (Signed)
 Post ED Visit - Positive Culture Follow-up  Culture report reviewed by antimicrobial stewardship pharmacist: [facility] [x]  Dorn Poot, Pharm.D. []  Venetia Gully, Pharm.D., BCPS AQ-ID []  Garrel Crews, Pharm.D., BCPS []  Almarie Lunger, Pharm.D., BCPS []  [place], [address].D., BCPS, AAHIVP []  Rosaline Bihari, Pharm.D., BCPS, AAHIVP []  Vernell Meier, PharmD, BCPS []  Latanya Hint, PharmD, BCPS []  Donald Medley, PharmD, BCPS []  Rocky Bold, PharmD []  Dorothyann Alert, PharmD, BCPS []  Morene Babe, PharmD  [facility] []  Rosaline Edison, PharmD []  Romona Bliss, PharmD []  Dolphus Roller, PharmD []  Veva Seip, Rph []  Vernell Daunt) [name], PharmD []  Eva Allis, PharmD []  Rosaline Millet, PharmD []  Iantha Batch, PharmD []  Arvin Gauss, PharmD []  Wanda Hasting, PharmD []  Ronal Rav, PharmD []  Rocky Slade, PharmD []  Bard Jeans, PharmD   Positive blood culture Currently admitted for treatment; no further patient follow-up is required at this time.  Loretta Burton 02/15/2024, 10:08 AM

## 2024-02-15 NOTE — Progress Notes (Signed)
 Loretta Burton  FMW:[id number] DOB: [birth year] DOA: 02/12/2024 PCP: Gretta Comer POUR, NP    Brief Narrative:  38 year old with a history of cervical adenocarcinoma in situ, GERD and depression who underwent cold knife cervical conization on 02/04/2024 (with a hx of a prior CKC Aug 2025) who reported to the ER 10/24 with significant pelvic pain a week following her surgery at which time CT abdomen pelvis with and without acute findings and the patient was discharged for outpatient follow-up.  She returned to the ER the following day (10/25) after she was informed that she had positive blood cultures (gram negative rods).  She had noted a fever up to 104 at home.  In the ER UA was abnormal.  Empiric antibiotic was initiated.  Goals of Care:   Code Status: Full Code   DVT prophylaxis:   Interim Hx: No acute events reported overnight.  Afebrile.  Vital signs stable.  Renal function stable.  WBC normal.  Hgb stable.  Reports that her oral intake is still somewhat limited.  Has developed a significant headache this morning.  No vomiting but some nausea.  Concerned she could not yet tolerate oral antibiotics.  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 rate 18, height 5' 4 (1.626 m), weight 102 kg, SpO2 99%.  Intake/Output Summary (Last 24 hours) at 02/15/2024 1019 Last data filed at 02/14/2024 1100 Gross per 24 hour  Intake 100 ml  Output 300 ml  Net -200 ml   Filed Weights   02/13/24 1105  Weight: 102 kg    Examination: General: No acute respiratory distress Lungs: Clear to auscultation bilaterally without wheezes or crackles Cardiovascular: Regular rate and rhythm without murmur gallop or rub normal S1 and S2 Abdomen: Tender diffusely across the lower quadrants but less so today, no rebound, bowel sounds positive Extremities: No significant edema bilateral lower extremities  CBC: Recent Labs  Lab 02/11/24 2143 02/12/24 1810 02/13/24 0850 02/14/24 0558 02/15/24 0513  WBC 9.2 4.7 4.8 4.5 5.4  NEUTROABS 6.4 2.9  --   --   --   HGB 13.6 13.4 11.0* 11.5* 11.7*  HCT 40.0 40.7 33.2* 34.2* 34.8*  MCV 89.7 91.3 91.0 90.0 90.4  PLT 261 211 164 182 191   Basic Metabolic Panel: Recent Labs  Lab 02/12/24 1810 02/13/24 0850 02/14/24 0558 02/15/24 0513  NA 138  --  139 139  K 3.4*  --  3.7 3.9  CL 102  --  105 107  CO2 24  --  23 22  GLUCOSE 89  --  106* 113*  BUN 12  --  6 8  CREATININE  0.64 0.63 0.56 0.71  CALCIUM 9.4  --  8.8* 8.9   GFR: Estimated Creatinine Clearance: 113 mL/min (by C-G formula based on SCr of 0.71 mg/dL).   Scheduled Meds:  cholecalciferol  5,000 Units Oral q AM   cyanocobalamin   5,000 mcg Oral Daily   enoxaparin (LOVENOX) injection  50 mg Subcutaneous Q24H   melatonin  10 mg Oral QHS   sertraline   100 mg Oral BH-q7a   Continuous Infusions:  cefTRIAXone (ROCEPHIN)  IV 2 g (02/15/24 0529)   promethazine  (PHENERGAN ) injection (IM or IVPB) 12.5 mg (02/14/24 1936)     LOS: 2 days   [name], MD [facility]  [phone] Pager - Text Page per Tracey  If 7PM-7AM, please contact night-coverage per Amion 02/15/2024, 10:19 AM

## 2024-02-16 ENCOUNTER — Other Ambulatory Visit (HOSPITAL_COMMUNITY): Payer: Self-pay

## 2024-02-16 DIAGNOSIS — R7881 Bacteremia: Secondary | ICD-10-CM | POA: Diagnosis not present

## 2024-02-16 DIAGNOSIS — B961 Klebsiella pneumoniae [K. pneumoniae] as the cause of diseases classified elsewhere: Secondary | ICD-10-CM | POA: Diagnosis not present

## 2024-02-16 LAB — CULTURE, BLOOD (ROUTINE X 2): Culture: NO GROWTH

## 2024-02-16 MED ORDER — ACETAMINOPHEN 325 MG PO TABS: 650.0000 mg | ORAL_TABLET | Freq: Four times a day (QID) | ORAL | Status: DC | PRN

## 2024-02-16 MED ORDER — CEFADROXIL 500 MG PO CAPS
500.0000 mg | ORAL_CAPSULE | Freq: Two times a day (BID) | ORAL | Status: DC
  Administered 2024-02-16: 500 mg via ORAL
  Filled 2024-02-16: qty 1

## 2024-02-16 MED ORDER — CEFADROXIL 500 MG PO CAPS
1000.0000 mg | ORAL_CAPSULE | Freq: Two times a day (BID) | ORAL | 0 refills | Status: AC
  Filled 2024-02-16: qty 24, 6d supply, fill #0

## 2024-02-16 MED ORDER — CEFADROXIL 500 MG PO CAPS
500.0000 mg | ORAL_CAPSULE | Freq: Two times a day (BID) | ORAL | 0 refills | Status: DC
  Filled 2024-02-16: qty 12, 6d supply, fill #0

## 2024-02-16 NOTE — Plan of Care (Signed)
   02/16/24   To Whom it may concern,  Loretta Burton was hospitalized at [facility] from 02/11/2024 through 02/16/2024.  She has been advised to refrain from work or childcare responsibilities until 02/18/2024.  Given the nature of her acute illness, and her needs at home after discharge, her husband has been and will continue to be needed to assist in her care and the care of their children. He has been advised to continue to do so until 02/18/2024 when it is expected [name] can resume her usual responsibilities.   Should you have further questions please feel free to contact our office.  Be advised that no medical information will be divulged without a signed HIPA release from the patient.    Sincerely,  [name], MD [facility]  [phone]

## 2024-02-16 NOTE — Progress Notes (Signed)
D/c tele and IV. Went over AVS with pt and all questions were addressed.   Lavenia Atlas, RN

## 2024-02-16 NOTE — Discharge Summary (Signed)
 DISCHARGE SUMMARY  Loretta Burton  MR#: [id number]  DOB:[birth year]  Date of Admission: 02/12/2024 Date of Discharge: 02/16/2024  Attending Physician:Loretta Enzor ONEIDA Moores, MD  Patient's ERE:Burton, Loretta K, NP  Disposition: D/C home   Follow-up Appts:  Follow-up Information     Loretta Comer POUR, NP Follow up in 1 week(s).   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 She returned to the ER the following day (10/25) after she was informed that she had positive blood cultures (gram negative rods). She had noted a fever up to 104 at home. In the ER UA was abnormal. Empiric antibiotic was initiated.   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   B-12 5000 MCG Caps Take 5,000 mcg by mouth daily.   cefadroxil 500 MG capsule Commonly known as: DURICEF Take 2 capsules (1,000 mg total) by mouth 2 (two) times daily for 6 days.   etonogestrel 68 MG Impl implant Commonly known as: NEXPLANON 1 each by Subdermal route once.   hydrOXYzine  10 MG tablet Commonly known as: ATARAX  Take 1 tablet (10 mg total) by mouth at bedtime as needed for anxiety.   Melatonin 10 MG Tabs Take 10 mg by mouth at bedtime.   ondansetron  4 MG tablet Commonly known as: ZOFRAN  Take 4 mg by mouth every 8 (eight) hours as needed.   oxyCODONE  5 MG immediate release tablet Commonly known as: Oxy IR/ROXICODONE  Take 5 mg by mouth every 6 (six) hours as needed (for pain).   PROBIOTIC PO Take 1 capsule by mouth in the morning.   sertraline  100 MG tablet Commonly known as: ZOLOFT  TAKE 1 TABLET BY MOUTH ONCE  DAILY FOR ANXIETY AND FOR DEPRESSION What changed: See the new instructions.   Vitamin D-3 125 MCG (5000 UT) Tabs Take 5,000 Units by mouth in the morning.        Day of Discharge BP 130/68 (BP Location: Left Arm)   Pulse 86   Temp 97.9 F (36.6 C)   Resp 17   Ht 5' 4 (1.626 m)   Wt 102 kg   SpO2 100%   BMI 38.60 kg/m   Physical Exam: General: No acute respiratory distress Lungs: Clear to auscultation bilaterally without wheezes or crackles Cardiovascular: Regular rate and rhythm without murmur gallop or rub normal S1 and  S2 Abdomen: Nontender, nondistended, soft, bowel sounds positive, no rebound, no ascites, no appreciable mass Extremities: No significant cyanosis, clubbing, or edema bilateral lower extremities  Basic Metabolic Panel: Recent Labs  Lab 02/11/24 2143 02/12/24 1810 02/13/24 0850 02/14/24 0558 02/15/24 0513  NA 137 138  --  139 139  K 3.7 3.4*  --  3.7 3.9  CL 103 102  --  105 107  CO2 19* 24  --  23 22  GLUCOSE 104* 89  --  106* 113*  BUN 11 12  --  6 8  CREATININE 0.66 0.64 0.63 0.56 0.71  CALCIUM 10.0 9.4  --  8.8* 8.9    CBC: Recent Labs  Lab 02/11/24 2143 02/12/24 1810 02/13/24 0850 02/14/24 0558 02/15/24 0513  WBC 9.2 4.7 4.8 4.5 5.4  NEUTROABS 6.4 2.9  --   --   --   HGB 13.6 13.4 11.0* 11.5* 11.7*  HCT 40.0 40.7 33.2* 34.2* 34.8*  MCV 89.7 91.3 91.0 90.0 90.4  PLT 261 211 164 182 191    Time spent in discharge (includes decision making & examination of pt): 35 minutes  02/16/2024, 5:22 PM   Loretta IVAR Moores, MD [facility]  [phone]

## 2024-02-17 ENCOUNTER — Telehealth: Payer: Self-pay

## 2024-02-17 LAB — URINE CULTURE: Culture: 50000 — AB

## 2024-02-17 NOTE — Transitions of Care (Post Inpatient/ED Visit) (Signed)
 02/17/2024  Name: Loretta Burton MRN: [id number] DOB: [birth year]  Today's TOC FU Call Status: Today's TOC FU Call Status:: Successful TOC FU Call Completed TOC FU Call Complete Date: 02/16/24 Patient's Name and Date of Birth confirmed.  Transition Care Management Follow-up Telephone Call Date of Discharge: 02/16/24 [facility]) Type of Discharge: Inpatient Admission Primary Inpatient Discharge Diagnosis:: klebsielle bacteremia and UTI How have you been since you were released from the hospital?: Same Any questions or concerns?: No  Items Reviewed: Did you receive and understand the discharge instructions provided?: Yes Medications obtained,verified, and reconciled?: Yes (Medications Reviewed) Any new allergies since your discharge?: No Dietary orders reviewed?: Yes Type of Diet Ordered:: regular Do you have support at home?: Yes People in Home [RPT]: spouse Name of Support/Comfort Primary Source: Loretta Burton  Medications Reviewed Today: Medications Reviewed Today     Reviewed by Loretta Burton (Registered Nurse) on 02/17/24 at 1116  Med List Status: <None>   Medication Order Taking? Sig Documenting Provider Last Dose Status Informant  acetaminophen  (TYLENOL ) 325 MG tablet [id number] Yes Take 2 tablets (650 mg total) by mouth every 6 (six) hours as needed for mild pain (pain score 1-3) or fever. Loretta Reyes DASEN, MD  Active   cefadroxil (DURICEF) 500 MG capsule [id number] Yes Take 2 capsules (1,000 mg total) by mouth 2 (two) times daily for 6 days. Loretta Reyes DASEN, MD  Active   Cholecalciferol (VITAMIN D-3) 125 MCG (5000 UT) TABS [id number] Yes Take 5,000 Units by mouth in the morning. [provider]  Active Self, Pharmacy Records  Cyanocobalamin  (B-12) 5000 MCG CAPS [id number] Yes Take 5,000 mcg by mouth daily. [provider]  Active Self, Pharmacy Records  etonogestrel (NEXPLANON) 68 MG IMPL implant [id number] Yes 1 each by  Subdermal route once. [provider]  Active Self, Pharmacy Records  hydrOXYzine  (ATARAX ) 10 MG tablet [id number] Yes Take 1 tablet (10 mg total) by mouth at bedtime as needed for anxiety. Loretta Loretta Burton, Loretta Burton  Active Self, Pharmacy Records  Melatonin 10 MG TABS [id number] Yes Take 10 mg by mouth at bedtime. [provider]  Active Self, Pharmacy Records  ondansetron  (ZOFRAN ) 4 MG tablet [id number] Yes Take 4 mg by mouth every 8 (eight) hours as needed. [provider]  Active Self, Pharmacy Records  oxyCODONE  (OXY IR/ROXICODONE ) 5 MG immediate release tablet [id number] Yes Take 5 mg by mouth every 6 (six) hours as needed (for pain). [provider]  Active Self, Pharmacy Records           Med Note Loretta Burton, Loretta Burton Repress Feb 13, 2024  7:36 AM) Hasn't picked up yet  Probiotic Product (PROBIOTIC PO) [id number] Yes Take 1 capsule by mouth in the morning. [provider]  Active Self, Pharmacy Records  sertraline  (ZOLOFT ) 100 MG tablet [id number] Yes TAKE 1 TABLET BY MOUTH ONCE DAILY FOR ANXIETY AND FOR DEPRESSION Loretta Burton, Loretta Burton, Loretta Burton  Active Self, Pharmacy Records           Med Note Loretta Burton, Loretta Burton   Thu Feb 17, 2024 11:16 AM) 100 mg in the morning            Home Care and Equipment/Supplies: Were Home Health Services Ordered?: No Any new equipment or medical supplies ordered?: No  Functional Questionnaire: Do you need assistance with bathing/showering or dressing?: No Do you need assistance with meal preparation?: No Do you need assistance with eating?: No Do you have  difficulty maintaining continence: No Do you need assistance with getting out of bed/getting out of a chair/moving?: No Do you have difficulty managing or taking your medications?: No  Follow up appointments reviewed: PCP Follow-up appointment confirmed?: Yes Date of PCP follow-up appointment?: 02/23/24 Follow-up Provider: Comer Burton, Loretta Burton [facility] Follow-up  appointment confirmed?: Yes Date of Specialist follow-up appointment?: 02/23/24 Follow-Up Specialty Provider:: Dr. Elby Do you need transportation to your follow-up appointment?: No Do you understand care options if your condition(s) worsen?: Yes-patient verbalized understanding  SDOH Interventions Today    Flowsheet Row Most Recent Value  SDOH Interventions   Food Insecurity Interventions Intervention Not Indicated  Housing Interventions Intervention Not Indicated  Transportation Interventions Intervention Not Indicated  Utilities Interventions Intervention Not Indicated   Discussed and offered 30 day TOC program.  Patient  declined.  The patient has been provided with contact information for the care management team and has been advised to call with any health -related questions or concerns.  The patient verbalized understanding with current plan of care.  The patient is directed to their insurance card regarding availability of benefits coverage.    Loretta Seip Burton, BSN, CCM [employer], Population Health Case Manager Phone: [phone]

## 2024-02-17 NOTE — Patient Instructions (Signed)
 Visit Information  Thank you for taking time to visit with me today. Please don't hesitate to contact me if I can be of assistance to you   Patient instructions: Contact a health care provider if: Your symptoms get worse, and medicines do not help. You have severe pain. Get help right away if you have: Chest pain or trouble breathing. A fever or chills. A fast heart rate. Skin that is blotchy, pale, or clammy. Confusion. Weakness, a lack of energy, or unusual sleepiness. New symptoms that develop after treatment has started. Keep follow up appointments with your providers    Patient verbalizes understanding of instructions and care plan provided today and agrees to view in MyChart. Active MyChart status and patient understanding of how to access instructions and care plan via MyChart confirmed with patient.     The patient has been provided with contact information for the care management team and has been advised to call with any health related questions or concerns.   Please call the care guide team at [phone] if you need to cancel or reschedule your appointment.   Please call the Suicide and Crisis Lifeline: 988 call the [place]  National Suicide Prevention Lifeline: (505)417-6297 or TTY: (727) 239-5907 TTY 716 419 9554) to talk to a trained counselor call 1-800-273-TALK (toll free, 24 hour hotline) if you are experiencing a Mental Health or Behavioral Health Crisis or need someone to talk to.  Arvin Seip RN, BSN, CCM [employer], Population Health Case Manager Phone: [phone]

## 2024-02-18 LAB — CULTURE, BLOOD (ROUTINE X 2)
Culture: NO GROWTH
Special Requests: ADEQUATE

## 2024-02-22 ENCOUNTER — Telehealth: Payer: Self-pay

## 2024-02-22 DIAGNOSIS — F32A Depression, unspecified: Secondary | ICD-10-CM

## 2024-02-23 ENCOUNTER — Encounter: Payer: Self-pay | Admitting: Primary Care

## 2024-02-23 ENCOUNTER — Telehealth: Payer: Self-pay

## 2024-02-23 ENCOUNTER — Inpatient Hospital Stay

## 2024-02-23 ENCOUNTER — Encounter: Payer: Self-pay | Admitting: Obstetrics and Gynecology

## 2024-02-23 ENCOUNTER — Ambulatory Visit: Admitting: Primary Care

## 2024-02-23 ENCOUNTER — Inpatient Hospital Stay: Attending: Obstetrics and Gynecology | Admitting: Obstetrics and Gynecology

## 2024-02-23 VITALS — BP 124/78 | HR 93 | Temp 98.7°F | Ht 64.0 in | Wt 234.0 lb

## 2024-02-23 VITALS — BP 115/76 | HR 98 | Temp 97.6°F | Ht 64.0 in | Wt 234.7 lb

## 2024-02-23 DIAGNOSIS — E538 Deficiency of other specified B group vitamins: Secondary | ICD-10-CM | POA: Insufficient documentation

## 2024-02-23 DIAGNOSIS — E785 Hyperlipidemia, unspecified: Secondary | ICD-10-CM | POA: Insufficient documentation

## 2024-02-23 DIAGNOSIS — K219 Gastro-esophageal reflux disease without esophagitis: Secondary | ICD-10-CM | POA: Diagnosis not present

## 2024-02-23 DIAGNOSIS — F32A Depression, unspecified: Secondary | ICD-10-CM | POA: Diagnosis not present

## 2024-02-23 DIAGNOSIS — Z6841 Body Mass Index (BMI) 40.0 and over, adult: Secondary | ICD-10-CM | POA: Diagnosis not present

## 2024-02-23 DIAGNOSIS — D069 Carcinoma in situ of cervix, unspecified: Secondary | ICD-10-CM | POA: Diagnosis not present

## 2024-02-23 DIAGNOSIS — B961 Klebsiella pneumoniae [K. pneumoniae] as the cause of diseases classified elsewhere: Secondary | ICD-10-CM

## 2024-02-23 DIAGNOSIS — R7881 Bacteremia: Secondary | ICD-10-CM

## 2024-02-23 DIAGNOSIS — Z809 Family history of malignant neoplasm, unspecified: Secondary | ICD-10-CM | POA: Diagnosis not present

## 2024-02-23 DIAGNOSIS — Z79899 Other long term (current) drug therapy: Secondary | ICD-10-CM | POA: Diagnosis not present

## 2024-02-23 DIAGNOSIS — F419 Anxiety disorder, unspecified: Secondary | ICD-10-CM | POA: Diagnosis not present

## 2024-02-23 DIAGNOSIS — N3001 Acute cystitis with hematuria: Secondary | ICD-10-CM | POA: Diagnosis not present

## 2024-02-23 LAB — POC URINALSYSI DIPSTICK (AUTOMATED)
Bilirubin, UA: NEGATIVE
Glucose, UA: NEGATIVE
Ketones, UA: NEGATIVE
Nitrite, UA: NEGATIVE
Protein, UA: NEGATIVE
Spec Grav, UA: 1.025 (ref 1.010–1.025)
Urobilinogen, UA: 0.2 U/dL
pH, UA: 5 (ref 5.0–8.0)

## 2024-02-23 MED ORDER — PROMETHAZINE HCL 12.5 MG PO TABS: 12.5000 mg | ORAL_TABLET | Freq: Three times a day (TID) | ORAL | 0 refills | Status: DC | PRN

## 2024-02-23 NOTE — Patient Instructions (Addendum)
 You may take promethazine  every 8 hours as needed for nausea.  This may cause drowsiness.  Ask the cancer center to draw your B12 lab.   I will be in touch again once I have your urine test back.  It was a pleasure to see you today!

## 2024-02-23 NOTE — Assessment & Plan Note (Signed)
 Repeat vitamin B12 level pending, she will try to get this at the cancer center today. Consider resuming injections with more frequent administration.

## 2024-02-23 NOTE — Assessment & Plan Note (Addendum)
 With recent hospitalization  Hospital notes, labs, imaging reviewed. Urinalysis today with blood and 3+ leuks.  Urine culture sent and pending  Do recommend repeat UA again in 1 to 2 weeks post antibiotic treatment.  She will complete this either with us  or oncology  Prescription for promethazine  12.5 mg provided today as Zofran  has been ineffective.

## 2024-02-23 NOTE — Progress Notes (Signed)
 Gynecologic Oncology Consult Visit   Referring Provider: Dr. Beverli Dinsmore  Chief Complaint: Adenocarcinoma in situ post CKC Subjective:  Loretta Burton is a 38 y.o. female who is seen in consultation from Dr. JONETTA. Schermerhorn for adenocarcinoma in situ post CKC x 2.   Pap/dysplasia history 12/24/16 NILM 05/13/20 NILM with +HRHPV 10/08/23 NILM with +HRHPV (+18/45) 11/15/23 Colpo endocervical adenocarcinoma in situ - 12/03/23 CKC AIS - originally pathology was read as positive focal lateral margins at 12:00 but re-read as + focal endocervical margins at 12:00. ECC benign.  - 02/04/24- CKC AIS with + endocervical Margins      1. Cervix, cone, suture 12:00 :       -  ADENOCARCINOMA IN SITU.       -  POSITIVE ENDOCERVICAL MARGIN IN THE 6-9 O'CLOCK SECTION.       2. Endocervix, curettage,  :       -  PREDOMINANTLY BLOOD AND SCATTERED STRIPS OF AND FRAGMENTS OF ENDOCERVIX AND       LOWER UTERINE SEGMENT, NEGATIVE FOR DYSPLASIA/HYPERPLASIA/ATYPIA ON SECTIONS EXAMINED.   Admitted to the hospital 10/25 with high fever, pelvic discomfort and blood culture positive for Klebsiella.  Treated with IV antibiotics and discharged after a few days.  Says she still have some pelvic discomfort.  Had US  yesterday that was concerning for UTI and culture pending.    Patient has satisfied parity.   Problem List: Patient Active Problem List   Diagnosis Date Noted   Bacteremia due to Klebsiella pneumoniae 02/13/2024   Vitamin B12 deficiency 10/15/2023   Low serum cortisol level 10/15/2023   Periorbital cellulitis of left eye 03/24/2023   Hyperlipidemia 12/25/2022   Preventative health care 01/26/2019   BMI 37.0-37.9, adult 12/24/2016   Insomnia 11/21/2015   Anxiety and depression 01/17/2014    Past Medical History: Past Medical History:  Diagnosis Date   Abscess of nose 09/01/2022   Anemia    Cervical adenocarcinoma (HCC)    Cholelithiasis    Decreased fetus movements affecting  management of mother in third trimester 08/12/2017   Depression    Depression affecting pregnancy 12/24/2016   GERD (gastroesophageal reflux disease)    Hyperlipidemia    Insomnia    Obesity affecting pregnancy 12/24/2016   Panic attack    Periorbital cellulitis of left eye    Seasonal allergies    Supervision of high-risk pregnancy 12/24/2016   Vitamin B12 deficiency     Past Surgical History: Past Surgical History:  Procedure Laterality Date   CERVICAL CONIZATION W/BX N/A 12/03/2023   Procedure: CONE BIOPSY, CERVIX;  Surgeon: Dinsmore Beverli GAILS, MD;  Location: [facility] ORS;  Service: Gynecology;  Laterality: N/A;  COLD KNIFE CONIZATION , ENDOCERVICAL CURETTAGE   CERVICAL CONIZATION W/BX N/A 02/04/2024   Procedure: CONE BIOPSY, CERVIX;  Surgeon: Dinsmore Beverli GAILS, MD;  Location: [facility] ORS;  Service: Gynecology;  Laterality: N/A;   CHOLECYSTECTOMY N/A 11/28/2014   Procedure: LAPAROSCOPIC CHOLECYSTECTOMY WITH INTRAOPERATIVE CHOLANGIOGRAM;  Surgeon: Charlie FORBES Fell, MD;  Location: [facility] ORS;  Service: General;  Laterality: N/A;   DILATION AND CURETTAGE OF UTERUS  02/04/2024   Procedure: ENDOCERVICAL CURETTAGE;  Surgeon: Schermerhorn, Beverli GAILS, MD;  Location: [facility] ORS;  Service: Gynecology;;   WISDOM TOOTH EXTRACTION     x4    Past Gynecologic History:  Menarche: age 49, periods last 6-7 days, cycle every 28-30 days.  Pain with menses.  Sexually active.   OB History:  OB History  Gravida Para Term Preterm AB  Living  2 2 2  0 0 2  SAB IAB Ectopic Multiple Live Births  0 0 0 0 2    # Outcome Date GA Lbr Len/2nd Weight Sex Type Anes PTL Lv  2 Term 08/13/17 [redacted]w[redacted]d / 00:14 8 lb 5.7 oz (3.79 kg) M Vag-Spont EPI  LIV  1 Term 10/14/14 [redacted]w[redacted]d 01:42 / 04:12 6 lb 9 oz (2.976 kg) F Vag-Spont EPI  LIV    Family History: Family History  Problem Relation Age of Onset   Diabetes Mother    Hypertension Mother    Depression Mother    Diabetes Maternal Grandmother    Hypertension  Maternal Grandmother    Heart disease Maternal Grandmother    Cancer Maternal Grandmother        skin   Diabetes Paternal Grandmother    Hypertension Paternal Grandmother    Heart disease Paternal Grandmother    Diabetes Paternal Grandfather    Hypertension Paternal Grandfather    Heart disease Paternal Grandfather     Social History: Social History   Socioeconomic History   Marital status: Married    Spouse name: Not on file   Number of children: Not on file   Years of education: Not on file   Highest education level: Not on file  Occupational History   Not on file  Tobacco Use   Smoking status: Never   Smokeless tobacco: Never  Vaping Use   Vaping status: Never Used  Substance and Sexual Activity   Alcohol use: No   Drug use: No   Sexual activity: Yes    Partners: Male    Birth control/protection: None  Other Topics Concern   Not on file  Social History Narrative   Married.   1 daughter.   Works at [employer] as a LAWYER.   Enjoys crafting, spending time with her daughter   Social Drivers of Corporate Investment Banker Strain: Low Risk  (10/08/2023)   Received from [facility]   Overall Financial Resource Strain (CARDIA)    Difficulty of Paying Living Expenses: Not hard at all  Food Insecurity: No Food Insecurity (02/17/2024)   Hunger Vital Sign    Worried About Running Out of Food in the Last Year: Never true    Ran Out of Food in the Last Year: Never true  Transportation Needs: No Transportation Needs (02/17/2024)   PRAPARE - Administrator, Civil Service (Medical): No    Lack of Transportation (Non-Medical): No  Physical Activity: Not on file  Stress: Not on file  Social Connections: Unknown (09/02/2021)   Received from [facility]   Social Network    Social Network: Not on file  Intimate Partner Violence: Not At Risk (02/17/2024)   Humiliation, Afraid, Rape, and Kick questionnaire    Fear of Current or  Ex-Partner: No    Emotionally Abused: No    Physically Abused: No    Sexually Abused: No    Allergies: Allergies  Allergen Reactions   Adhesive [Tape] Itching   Ciprofloxacin  Hives and Nausea And Vomiting    Current Medications: Current Outpatient Medications  Medication Sig Dispense Refill   acetaminophen  (TYLENOL ) 325 MG tablet Take 2 tablets (650 mg total) by mouth every 6 (six) hours as needed for mild pain (pain score 1-3) or fever.     Cholecalciferol (VITAMIN D-3) 125 MCG (5000 UT) TABS Take 5,000 Units by mouth in the morning.     Cyanocobalamin  (B-12) 5000 MCG CAPS  Take 5,000 mcg by mouth daily.     etonogestrel (NEXPLANON) 68 MG IMPL implant 1 each by Subdermal route once.     hydrOXYzine  (ATARAX ) 10 MG tablet Take 1 tablet (10 mg total) by mouth at bedtime as needed for anxiety. 30 tablet 0   Melatonin 10 MG TABS Take 10 mg by mouth at bedtime.     Probiotic Product (PROBIOTIC PO) Take 1 capsule by mouth in the morning.     promethazine  (PHENERGAN ) 12.5 MG tablet Take 1 tablet (12.5 mg total) by mouth every 8 (eight) hours as needed for nausea or vomiting. 20 tablet 0   sertraline  (ZOLOFT ) 100 MG tablet TAKE 1 TABLET BY MOUTH ONCE DAILY FOR ANXIETY AND FOR DEPRESSION 90 tablet 0   No current facility-administered medications for this visit.   Review of Systems General: negative for fevers, changes in weight or night sweats Skin: negative for changes in moles or sores or rash Eyes: negative for changes in vision HEENT: negative for change in hearing, tinnitus, voice changes Pulmonary: negative for dyspnea, orthopnea, productive cough, wheezing Cardiac: negative for palpitations, pain Gastrointestinal: negative for nausea, vomiting, constipation, diarrhea, hematemesis, hematochezia Genitourinary/Sexual: negative for dysuria, retention, hematuria, incontinence Ob/Gyn:  negative for abnormal bleeding, or pain Musculoskeletal: negative for pain, joint pain, back  pain Hematology: negative for easy bruising, abnormal bleeding Neurologic/Psych: negative for headaches, seizures, paralysis, weakness, numbness   Objective:  Physical Examination:  BP 115/76 (BP Location: Left Arm, Patient Position: Sitting)   Pulse 98   Temp 97.6 F (36.4 C) (Tympanic)   Ht 5' 4 (1.626 m)   Wt 234 lb 11.2 oz (106.5 kg)   SpO2 98%   BMI 40.29 kg/m     ECOG Performance Status: 1 - Symptomatic but completely ambulatory  General appearance: alert, cooperative, and appears stated age HEENT: mucus membranes moist no mucosal lesions posterior pharynx benign PERRL EOMI sclera clear Neck: no thyroid  enlargement or cervical adenopathy Lymph node survey: non-palpable, axillary, inguinal, supraclavicular Cardiovascular: without murmurs, rubs or gallops Respiratory: clear to auscultation Abdomen: soft obese non tender Back: inspection of back is normal Extremities: no lower extremity edema Skin exam - normal coloration and turgor, no rashes, no suspicious skin lesions noted. Neurological exam reveals alert, oriented, normal speech, no focal findings or movement disorder noted.  Pelvic: Exam chaperoned by RN EGBUS: no lesions Cervix: no lesions, and there is some cervix left, healing well Vagina: no lesions, no discharge or bleeding Uterus: normal size, nontender, mobile Adnexa: no palpable masses Rectovaginal: confirmatory  Lab Review   Chemistry      Component Value Date/Time   NA 139 02/15/2024 0513   K 3.9 02/15/2024 0513   CL 107 02/15/2024 0513   CO2 22 02/15/2024 0513   BUN 8 02/15/2024 0513   CREATININE 0.71 02/15/2024 0513      Component Value Date/Time   CALCIUM 8.9 02/15/2024 0513   ALKPHOS 65 02/12/2024 1810   AST 25 02/12/2024 1810   ALT 31 02/12/2024 1810   BILITOT 0.6 02/12/2024 1810      Lab Results  Component Value Date   WBC 5.4 02/15/2024   HGB 11.7 (L) 02/15/2024   HCT 34.8 (L) 02/15/2024   MCV 90.4 02/15/2024   PLT 191  02/15/2024   Radiologic Imaging: CT scan 02/11/24 FINDINGS: Lower chest: Lung bases demonstrate no acute airspace disease.   Hepatobiliary: Liver is enlarged, craniocaudal measurement of 23 cm. Fatty infiltration. No focal hepatic abnormality. Cholecystectomy. No biliary dilatation  Pancreas: Unremarkable. No pancreatic ductal dilatation or surrounding inflammatory changes.   Spleen: Normal in size without focal abnormality.   Adrenals/Urinary Tract: Adrenal glands are normal. Kidneys show no hydronephrosis. The bladder is unremarkable. Punctate right kidney stone   Stomach/Bowel: Stomach within normal limits. No acute bowel wall thickening. Diverticular disease of the colon. Negative appendix   Vascular/Lymphatic: No significant vascular findings are present. No enlarged abdominal or pelvic lymph nodes.   Reproductive: No suspicious adnexal mass. Heterogenous material within the cervix.   Other: No abdominal wall hernia or abnormality. No abdominopelvic ascites.   Musculoskeletal: None   IMPRESSION: 1. No CT evidence for acute intra-abdominal or pelvic abnormality. Heterogenous material in the cervix, may represent surgical material. No evidence for rim enhancing pelvic abscess. No gas within the endometrial cavity superior to the cervical findings. 2. Hepatomegaly with fatty infiltration of the liver. 3. Diverticular disease of the colon without acute inflammatory process. 4. Nonobstructing right kidney stone    Assessment:  Loretta Burton is a 38 y.o. P2 female diagnosed with cervical adenocarcinoma in situ s/p cone biopsies x2 in 8/25 and 10/25 with positive margins. No invasive cancer.  Satisfied parity.    12/24/16 NILM 05/13/20 NILM with +HRHPV 10/08/23 NILM with +HRHPV (+18/45) 11/15/23 Colpo endocervical adenocarcinoma in situ  Episode of Klebsiella sepsis after last cone biopsy and was admitted to the hospital for IV antibiotics about 10 days  ago.  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   MRI scan will be done to look for any evidence of a tumor or mass in the residual cervix. If MRI is reassuring and no tumor on scan or concern for cancer on review of pathology she could undergo simple hysterectomy. If concern for cancer then could do a third cone biopsy to better delineate the extent of disease and need for simple vs radical hysterectomy or lymph node sampling.    Sepsis post op likely due to pelvic infection post cone biopsy.  CT scan was reassuring.  She will follow up on urine culture to see if any further antibiotics needed.    The patient's diagnosis, an outline of the further diagnostic and laboratory studies which will be required, the recommendation for surgery, and alternatives were discussed with her and her accompanying family members.  All questions were answered to their satisfaction.  A total of 60 minutes were spent with the patient/family today; 50% was spent in education, counseling and coordination of care for AIS cervix.    Tinnie Dawn, DNP, AGNP-C, AOCNP [facility] [phone] (clinic)  I personally had a face to face interaction and evaluated the patient jointly with the NP, Ms. Tinnie Dawn.  I have reviewed her history and available records and have performed the key portions of the physical exam including lymph node survey, abdominal exam, pelvic exam with my findings confirming those documented above by the APP.  I have discussed the case with the APP and the patient.  I agree with the above documentation, assessment and plan which was fully formulated by me.  Counseling was completed by me.   I personally saw the patient and performed a substantive portion of this encounter in  conjunction with the listed APP as documented above.  Prentice Agent, MD  CC:  Schermerhorn, Beverli GAILS, MD [address] [place],  [place] [address] [phone]

## 2024-02-23 NOTE — Progress Notes (Signed)
 Subjective:    Patient ID: Loretta Burton, female    DOB: 28-Mar-1986, 38 y.o.   MRN: [id number]  Loretta Burton is a very pleasant 38 y.o. female with a history of anemia, cervical adenocarcinoma in situ status post repeat CKC on 02/04/2024 who presents today for hospital follow-up.  Her husband joins us  today  She presented to [facility] ED on 02/11/2024 for ongoing nausea, pelvic discomfort since receiving some unfortunate news about her cervical biopsy.  She then developed a fever of 100.4 with chills, mild bloody vaginal discharge.  During her stay in the ED she underwent CT abdomen pelvis which were without acute findings.  She was discharged home later that day.  She returned to [facility] ED on 02/13/2024 for positive blood cultures (Klebsiella pneumonia) and continued fevers.  Urinalysis was possible UTI versus pyelonephritis.  She was treated with IV antibiotics and admitted for further evaluation.  OB/GYN consulted who do not feel that her symptoms were secondary to her surgery.  She was discharged home on 02/16/2024 with a 10-day supply of oral antibiotics.(Duricef 1000 mg BID).   Since her discharge home she completed her antibiotics yesterday. She now has vaginal itching and was prescribed Fluconazole for one dose. She continues to feel poorly, feels nauseated, dizzy, low grade fever at night with chills during the night. She is trying to stay hydrated, her appetite has been decreased due to her nausea.  Zofran  has been ineffective.  She is feeling anxious given everything that's going on.  She has an appointment scheduled later today with oncology to discuss next steps.   She denies vaginal bleeding.  She is also due for repeat vitamin B12 check.  She felt better on the injections but felt they wore off halfway through the month.    Review of Systems  Constitutional:  Positive for fatigue and fever.  Gastrointestinal:  Positive for nausea.  Genitourinary:  Negative for dysuria, vaginal  bleeding and vaginal discharge.  Psychiatric/Behavioral:  The patient is nervous/anxious.          Past Medical History:  Diagnosis Date   Abscess of nose 09/01/2022   Anemia    Cervical adenocarcinoma (HCC)    Cholelithiasis    Decreased fetus movements affecting management of mother in third trimester 08/12/2017   Depression    Depression affecting pregnancy 12/24/2016   GERD (gastroesophageal reflux disease)    Hyperlipidemia    Insomnia    Obesity affecting pregnancy 12/24/2016   Panic attack    Periorbital cellulitis of left eye    Seasonal allergies    Supervision of high-risk pregnancy 12/24/2016   Vitamin B12 deficiency     Social History   Socioeconomic History   Marital status: Married    Spouse name: Not on file   Number of children: Not on file   Years of education: Not on file   Highest education level: Not on file  Occupational History   Not on file  Tobacco Use   Smoking status: Never   Smokeless tobacco: Never  Vaping Use   Vaping status: Never Used  Substance and Sexual Activity   Alcohol use: No   Drug use: No   Sexual activity: Yes    Partners: Male    Birth control/protection: None  Other Topics Concern   Not on file  Social History Narrative   Married.   1 daughter.   Works at [employer] as a LAWYER.   Enjoys crafting, spending time with her  daughter   Social Drivers of Corporate Investment Banker Strain: Low Risk  (10/08/2023)   Received from [facility]   Overall Financial Resource Strain (CARDIA)    Difficulty of Paying Living Expenses: Not hard at all  Food Insecurity: No Food Insecurity (02/17/2024)   Hunger Vital Sign    Worried About Running Out of Food in the Last Year: Never true    Ran Out of Food in the Last Year: Never true  Transportation Needs: No Transportation Needs (02/17/2024)   PRAPARE - Administrator, Civil Service (Medical): No    Lack of Transportation (Non-Medical): No   Physical Activity: Not on file  Stress: Not on file  Social Connections: Unknown (09/02/2021)   Received from [facility]   Social Network    Social Network: Not on file  Intimate Partner Violence: Not At Risk (02/17/2024)   Humiliation, Afraid, Rape, and Kick questionnaire    Fear of Current or Ex-Partner: No    Emotionally Abused: No    Physically Abused: No    Sexually Abused: No    Past Surgical History:  Procedure Laterality Date   CERVICAL CONIZATION W/BX N/A 12/03/2023   Procedure: CONE BIOPSY, CERVIX;  Surgeon: Schermerhorn, Beverli GAILS, MD;  Location: [facility] ORS;  Service: Gynecology;  Laterality: N/A;  COLD KNIFE CONIZATION , ENDOCERVICAL CURETTAGE   CERVICAL CONIZATION W/BX N/A 02/04/2024   Procedure: CONE BIOPSY, CERVIX;  Surgeon: Lovetta Beverli GAILS, MD;  Location: [facility] ORS;  Service: Gynecology;  Laterality: N/A;   CHOLECYSTECTOMY N/A 11/28/2014   Procedure: LAPAROSCOPIC CHOLECYSTECTOMY WITH INTRAOPERATIVE CHOLANGIOGRAM;  Surgeon: Charlie FORBES Fell, MD;  Location: [facility] ORS;  Service: General;  Laterality: N/A;   DILATION AND CURETTAGE OF UTERUS  02/04/2024   Procedure: ENDOCERVICAL CURETTAGE;  Surgeon: Schermerhorn, Beverli GAILS, MD;  Location: [facility] ORS;  Service: Gynecology;;   WISDOM TOOTH EXTRACTION     x4    Family History  Problem Relation Age of Onset   Diabetes Mother    Hypertension Mother    Depression Mother    Diabetes Maternal Grandmother    Hypertension Maternal Grandmother    Heart disease Maternal Grandmother    Cancer Maternal Grandmother        skin   Diabetes Paternal Grandmother    Hypertension Paternal Grandmother    Heart disease Paternal Grandmother    Diabetes Paternal Grandfather    Hypertension Paternal Grandfather    Heart disease Paternal Grandfather     Allergies  Allergen Reactions   Adhesive [Tape] Itching   Ciprofloxacin  Hives and Nausea And Vomiting    Current Outpatient Medications on File Prior to Visit  Medication Sig  Dispense Refill   acetaminophen  (TYLENOL ) 325 MG tablet Take 2 tablets (650 mg total) by mouth every 6 (six) hours as needed for mild pain (pain score 1-3) or fever.     Cholecalciferol (VITAMIN D-3) 125 MCG (5000 UT) TABS Take 5,000 Units by mouth in the morning.     Cyanocobalamin  (B-12) 5000 MCG CAPS Take 5,000 mcg by mouth daily.     etonogestrel (NEXPLANON) 68 MG IMPL implant 1 each by Subdermal route once.     hydrOXYzine  (ATARAX ) 10 MG tablet Take 1 tablet (10 mg total) by mouth at bedtime as needed for anxiety. 30 tablet 0   Melatonin 10 MG TABS Take 10 mg by mouth at bedtime.     Probiotic Product (PROBIOTIC PO) Take 1 capsule by mouth in the morning.  sertraline  (ZOLOFT ) 100 MG tablet TAKE 1 TABLET BY MOUTH ONCE DAILY FOR ANXIETY AND FOR DEPRESSION 90 tablet 0   No current facility-administered medications on file prior to visit.    BP 124/78   Pulse 93   Temp 98.7 F (37.1 C) (Oral)   Ht 5' 4 (1.626 m)   Wt 234 lb (106.1 kg)   LMP  (Within Months)   SpO2 97%   BMI 40.17 kg/m  Objective:   Physical Exam Cardiovascular:     Rate and Rhythm: Normal rate and regular rhythm.  Pulmonary:     Effort: Pulmonary effort is normal.     Breath sounds: Normal breath sounds.  Musculoskeletal:     Cervical back: Neck supple.  Skin:    General: Skin is warm and dry.  Neurological:     Mental Status: She is alert and oriented to person, place, and time.  Psychiatric:        Mood and Affect: Mood normal.     Physical Exam        Assessment & Plan:  Bacteremia due to Klebsiella pneumoniae Assessment & Plan: With recent hospitalization  Hospital notes, labs, imaging reviewed. Urinalysis today with blood and 3+ leuks.  Urine culture sent and pending  Do recommend repeat UA again in 1 to 2 weeks post antibiotic treatment.  She will complete this either with us  or oncology  Prescription for promethazine  12.5 mg provided today as Zofran  has been ineffective.    Orders: -     Promethazine  HCl; Take 1 tablet (12.5 mg total) by mouth every 8 (eight) hours as needed for nausea or vomiting.  Dispense: 20 tablet; Refill: 0 -     POCT Urinalysis Dipstick (Automated) -     Urine Culture  Vitamin B12 deficiency Assessment & Plan: Repeat vitamin B12 level pending, she will try to get this at the cancer center today. Consider resuming injections with more frequent administration.  Orders: -     Vitamin B12; Future    Assessment and Plan Assessment & Plan         Comer MARLA Gaskins, NP    History of Present Illness

## 2024-02-23 NOTE — Progress Notes (Signed)
 Complex Care Management Note  Care Guide Note 02/23/2024 Name: CHIKITA DOGAN MRN: [id number] DOB: 1986/02/14  Lucie JONELLE Alig is a 38 y.o. year old female who sees Gretta Comer POUR, NP for primary care. I reached out to [employer] by phone today to offer complex care management services.  Ms. Fata was given information about Complex Care Management services today including:   The Complex Care Management services include support from the care team which includes your Nurse Care Manager, Clinical Social Worker, or Pharmacist.  The Complex Care Management team is here to help remove barriers to the health concerns and goals most important to you. Complex Care Management services are voluntary, and the patient may decline or stop services at any time by request to their care team member.   Complex Care Management Consent Status: Patient did not agree to participate in complex care management services at this time.  Follow up plan: Patient will follow up with PCP.   Encounter Outcome:  Patient Refused  Dreama Agent [facility], [facility] VBCI Assistant Direct Dial: [phone]  Fax: [phone]

## 2024-02-24 ENCOUNTER — Other Ambulatory Visit: Payer: Self-pay

## 2024-02-24 ENCOUNTER — Ambulatory Visit: Payer: Self-pay

## 2024-02-24 DIAGNOSIS — D069 Carcinoma in situ of cervix, unspecified: Secondary | ICD-10-CM

## 2024-02-24 NOTE — Progress Notes (Signed)
 MRI order updated to reflect [facility] as place of service per insurance.

## 2024-02-24 NOTE — Telephone Encounter (Signed)
 Patient called to set up lab work for tomorrow at her PCP office Patient was just seen by her PCP yesterday 02/23/2024 for her hospital follow up visit and had discussed her symptoms and patient also saw her oncologist yesterday as well Patient states her oncologist did not order lab work so she was calling her PCP office back today to let her PCP know this and get scheduled for lab work Patient states that nothing has changed since yesterday when she was seen by her PCP and her oncologist and nothing is worse. She had an MRI scheduled for 02/27/2024 by her oncologist  Labwork at [facility] scheduled for 02/25/2024 at 10:15am at [facility]  Patient is advised to call us  back if anything changes or worsens. She verbalized understanding.   FYI Only or Action Required?: Action required by provider: update on patient condition.  Patient was last seen in primary care on 02/23/2024 by Gretta Comer POUR, NP.  Called Nurse Triage reporting Appointment.  Symptoms are: unchanged.  Triage Disposition: Information or Advice Only Call  Patient/caregiver understands and will follow disposition?: Yes                Copied from CRM #[id number]. Topic: Clinical - Red Word Triage >> Feb 24, 2024  2:34 PM Joesph NOVAK wrote: Red Word that prompted transfer to Nurse Triage: dizziness. Reason for Disposition  [1] Follow-up call to recent contact AND [2] information only call, no triage required  Answer Assessment - Initial Assessment Questions Patient called to set up lab work for tomorrow at her PCP office Patient was just seen by her PCP yesterday 02/23/2024 for her hospital follow up visit and had discussed her symptoms and patient also saw her oncologist yesterday as well Patient states her oncologist did not order lab work so she was calling her PCP office back today to let her PCP know this and get scheduled for lab work Patient states that nothing has changed since yesterday when she  was seen by her PCP and her oncologist and nothing is worse. She had an MRI scheduled for 02/27/2024 by her oncologist  Patient is advised to call us  back if anything changes or worsens. She verbalized understanding.  Protocols used: Information Only Call - No Triage-A-AH

## 2024-02-24 NOTE — Telephone Encounter (Signed)
 Noted

## 2024-02-25 ENCOUNTER — Other Ambulatory Visit (INDEPENDENT_AMBULATORY_CARE_PROVIDER_SITE_OTHER): Payer: Self-pay

## 2024-02-25 ENCOUNTER — Ambulatory Visit: Payer: Self-pay | Admitting: Primary Care

## 2024-02-25 ENCOUNTER — Other Ambulatory Visit (INDEPENDENT_AMBULATORY_CARE_PROVIDER_SITE_OTHER)

## 2024-02-25 DIAGNOSIS — B961 Klebsiella pneumoniae [K. pneumoniae] as the cause of diseases classified elsewhere: Secondary | ICD-10-CM

## 2024-02-25 DIAGNOSIS — R7881 Bacteremia: Secondary | ICD-10-CM

## 2024-02-25 DIAGNOSIS — Z9889 Other specified postprocedural states: Secondary | ICD-10-CM | POA: Diagnosis not present

## 2024-02-25 DIAGNOSIS — E538 Deficiency of other specified B group vitamins: Secondary | ICD-10-CM

## 2024-02-25 DIAGNOSIS — N3001 Acute cystitis with hematuria: Secondary | ICD-10-CM

## 2024-02-25 LAB — CBC WITH DIFFERENTIAL/PLATELET
Basophils Absolute: 0.1 K/uL (ref 0.0–0.1)
Basophils Relative: 1.1 % (ref 0.0–3.0)
Eosinophils Absolute: 0.1 K/uL (ref 0.0–0.7)
Eosinophils Relative: 1.3 % (ref 0.0–5.0)
HCT: 37.9 % (ref 36.0–46.0)
Hemoglobin: 13 g/dL (ref 12.0–15.0)
Lymphocytes Relative: 26.5 % (ref 12.0–46.0)
Lymphs Abs: 2.3 K/uL (ref 0.7–4.0)
MCHC: 34.2 g/dL (ref 30.0–36.0)
MCV: 89.1 fl (ref 78.0–100.0)
Monocytes Absolute: 0.8 K/uL (ref 0.1–1.0)
Monocytes Relative: 9 % (ref 3.0–12.0)
Neutro Abs: 5.4 K/uL (ref 1.4–7.7)
Neutrophils Relative %: 62.1 % (ref 43.0–77.0)
Platelets: 267 K/uL (ref 150.0–400.0)
RBC: 4.25 Mil/uL (ref 3.87–5.11)
RDW: 13 % (ref 11.5–15.5)
WBC: 8.6 K/uL (ref 4.0–10.5)

## 2024-02-25 LAB — VITAMIN B12: Vitamin B-12: 477 pg/mL (ref 211–911)

## 2024-02-27 ENCOUNTER — Ambulatory Visit

## 2024-02-27 LAB — URINE CULTURE
MICRO NUMBER:: 17194625
SPECIMEN QUALITY:: ADEQUATE

## 2024-02-27 MED ORDER — NITROFURANTOIN MONOHYD MACRO 100 MG PO CAPS: 100.0000 mg | ORAL_CAPSULE | Freq: Two times a day (BID) | ORAL | 0 refills | Status: AC

## 2024-02-29 ENCOUNTER — Ambulatory Visit
Admission: RE | Admit: 2024-02-29 | Discharge: 2024-02-29 | Disposition: A | Discharge status: Home / Self Care | Source: Ambulatory Visit | Attending: Nurse Practitioner | Admitting: Nurse Practitioner

## 2024-02-29 DIAGNOSIS — D069 Carcinoma in situ of cervix, unspecified: Secondary | ICD-10-CM

## 2024-02-29 DIAGNOSIS — C539 Malignant neoplasm of cervix uteri, unspecified: Secondary | ICD-10-CM | POA: Diagnosis not present

## 2024-02-29 MED ORDER — GADOPICLENOL 0.5 MMOL/ML IV SOLN
10.0000 mL | Freq: Once | INTRAVENOUS | Status: AC | PRN
  Administered 2024-02-29: 10 mL via INTRAVENOUS

## 2024-03-01 ENCOUNTER — Inpatient Hospital Stay

## 2024-03-07 ENCOUNTER — Telehealth: Payer: Self-pay

## 2024-03-07 NOTE — Telephone Encounter (Signed)
 Second call placed to [facility] regarding cases that were requested be sent to [facility] for consult. Original request sent 02/14/24. [facility] has not accessioned slides. [facility] will follow up and make us  aware where they are in this process.

## 2024-03-08 ENCOUNTER — Inpatient Hospital Stay: Admitting: Primary Care

## 2024-03-09 ENCOUNTER — Telehealth: Payer: Self-pay

## 2024-03-09 NOTE — Telephone Encounter (Signed)
 Results of MRI sent to Dr. Mancil for review. Slide consult at [facility] pending.

## 2024-03-10 ENCOUNTER — Telehealth: Payer: Self-pay

## 2024-03-10 NOTE — Telephone Encounter (Signed)
 Call placed to Loretta Burton. Dr. Mancil has reviewed MRI, which is reassuring. He would like to wait on pathology review at [facility] to make decision regarding further treatment plan. No answer, left voicemail to return call.

## 2024-03-10 NOTE — Telephone Encounter (Signed)
 Call returned to Loretta Burton. Dr. Mancil has reviewed MRI which is reassuring for simple hysterectomy. He would like to wait on [facility] pathology review before making final decision.

## 2024-03-23 DIAGNOSIS — D069 Carcinoma in situ of cervix, unspecified: Secondary | ICD-10-CM | POA: Diagnosis not present
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
## 2024-03-28 ENCOUNTER — Inpatient Hospital Stay
Admission: RE | Admit: 2024-03-28 | Discharge: 2024-03-28 | Attending: Obstetrics and Gynecology | Admitting: Obstetrics and Gynecology

## 2024-03-28 ENCOUNTER — Other Ambulatory Visit: Payer: Self-pay

## 2024-03-28 DIAGNOSIS — Z01818 Encounter for other preprocedural examination: Secondary | ICD-10-CM | POA: Diagnosis not present

## 2024-03-28 NOTE — Patient Instructions (Addendum)
 Your procedure is scheduled on: TUESDAY 04/04/24 Report to the Registration Desk on the 1st floor of the [facility]. To find out your arrival time, please call [phone] between 1PM - 3PM on: MONDAY 04/03/24 If your arrival time is 6:00 am, do not arrive before that time as the Medical Mall entrance doors do not open until 6:00 am.  REMEMBER: Instructions that are not followed completely may result in serious medical risk, up to and including death; or upon the discretion of your surgeon and anesthesiologist your surgery may need to be rescheduled.  Do not eat food after midnight the night before surgery.  No gum chewing or hard candies.  You may however, drink CLEAR liquids up to 2 hours before you are scheduled to arrive for your surgery. Do not drink anything within 2 hours of your scheduled arrival time.  Clear liquids include: - water  - apple juice without pulp - gatorade (not RED colors) - black coffee or tea (Do NOT add milk or creamers to the coffee or tea) Do NOT drink anything that is not on this list.  In addition, your doctor has ordered for you to drink the provided:  Ensure Pre-Surgery Clear Carbohydrate Drink  Drinking this carbohydrate drink up to two hours before surgery helps to reduce insulin resistance and improve patient outcomes. Please complete drinking 2 hours before scheduled arrival time.  One week prior to surgery: Stop Anti-inflammatories (NSAIDS) such as Advil , Aleve, Ibuprofen , Motrin , Naproxen, Naprosyn and Aspirin based products such as Excedrin, Goody's Powder, BC Powder.  Stop ANY OVER THE COUNTER supplements until after surgery.   You may however, continue to take Tylenol  if needed for pain up until the day of surgery.  Continue taking all of your other prescription medications up until the day of surgery.  ON THE DAY OF SURGERY ONLY TAKE THESE MEDICATIONS WITH SIPS OF WATER:  hydrOXYzine  (ATARAX )  sertraline  (ZOLOFT )   Use inhalers on  the day of surgery and bring to the hospital.  No Alcohol for 24 hours before or after surgery.  No Smoking including e-cigarettes for 24 hours before surgery.  No chewable tobacco products for at least 6 hours before surgery.  No nicotine patches on the day of surgery.  Do not use any recreational drugs for at least a week (preferably 2 weeks) before your surgery.  Please be advised that the combination of cocaine and anesthesia may have negative outcomes, up to and including death. If you test positive for cocaine, your surgery will be cancelled.  On the morning of surgery brush your teeth with toothpaste and water, you may rinse your mouth with mouthwash if you wish. Do not swallow any toothpaste or mouthwash.  Use CHG Soap or wipes as directed on instruction sheet.  Do not wear jewelry, make-up, hairpins, clips or nail polish.  For welded (permanent) jewelry: bracelets, anklets, waist bands, etc.  Please have this removed prior to surgery.  If it is not removed, there is a chance that hospital personnel will need to cut it off on the day of surgery.  Do not wear lotions, powders, or perfumes.   Do not shave body hair from the neck down 48 hours before surgery.  Contact lenses, hearing aids and dentures may not be worn into surgery.  Do not bring valuables to the hospital. [facility] is not responsible for any missing/lost belongings or valuables.   Bring your C-PAP to the hospital in case you may have to spend the  night.   Notify your doctor if there is any change in your medical condition (cold, fever, infection).  Wear comfortable clothing (specific to your surgery type) to the hospital.  After surgery, you can help prevent lung complications by doing breathing exercises.  Take deep breaths and cough every 1-2 hours. Your doctor may order a device called an Incentive Spirometer to help you take deep breaths. When coughing or sneezing, hold a pillow firmly against your  incision with both hands. This is called "splinting." Doing this helps protect your incision. It also decreases belly discomfort.  If you are being admitted to the hospital overnight, leave your suitcase in the car. After surgery it may be brought to your room.  In case of increased patient census, it may be necessary for you, the patient, to continue your postoperative care in the Same Day Surgery department.  If you are being discharged the day of surgery, you will not be allowed to drive home.  You will need a responsible individual to drive you home and stay with you for 24 hours after surgery.   If you are taking public transportation, you will need to have a responsible individual with you.  Please call the Pre-admissions Testing Dept. at [phone] if you have any questions about these instructions.  Surgery Visitation Policy:  Patients having surgery or a procedure may have two visitors.  Children under the age of 53 must have an adult with them who is not the patient.  Merchandiser, Retail to address health-related social needs:  [email or web address]                                                                                                             Preparing for Surgery with CHLORHEXIDINE  GLUCONATE (CHG) Soap  Chlorhexidine  Gluconate (CHG) Soap  o An antiseptic cleaner that kills germs and bonds with the skin to continue killing germs even after washing  o Used for showering the night before surgery and morning of surgery  Before surgery, you can play an important role by reducing the number of germs on your skin.  CHG (Chlorhexidine  gluconate) soap is an antiseptic cleanser which kills germs and bonds with the skin to continue killing germs even after washing.  Please do not use if you have an allergy to CHG or antibacterial soaps. If your skin becomes reddened/irritated stop using the CHG.  1. Shower the NIGHT BEFORE SURGERY with CHG  soap.  2. If you choose to wash your hair, wash your hair first as usual with your normal shampoo.  3. After shampooing, rinse your hair and body thoroughly to remove the shampoo.  4. Use CHG as you would any other liquid soap. You can apply CHG directly to the skin and wash gently with a clean washcloth.  5. Apply the CHG soap to your body only from the neck down. Do not use on open wounds or open sores. Avoid contact with your eyes, ears, mouth, and genitals (private parts). Wash face and genitals (private parts) with your normal  soap.  6. Wash thoroughly, paying special attention to the area where your surgery will be performed.  7. Thoroughly rinse your body with warm water.  8. Do not shower/wash with your normal soap after using and rinsing off the CHG soap.  9. Do not use lotions, oils, etc., after showering with CHG.  10. Pat yourself dry with a clean towel.  11. Wear clean pajamas to bed the night before surgery.  12. Place clean sheets on your bed the night of your shower and do not sleep with pets.  13. Do not apply any deodorants/lotions/powders.  14. Please wear clean clothes to the hospital.  15. Remember to brush your teeth with your regular toothpaste.

## 2024-03-29 ENCOUNTER — Encounter: Payer: Self-pay | Admitting: Obstetrics and Gynecology

## 2024-03-30 ENCOUNTER — Other Ambulatory Visit: Payer: Self-pay | Admitting: Primary Care

## 2024-03-30 DIAGNOSIS — F32A Depression, unspecified: Secondary | ICD-10-CM

## 2024-04-03 ENCOUNTER — Inpatient Hospital Stay
Admission: RE | Admit: 2024-04-03 | Discharge: 2024-04-03 | Attending: Obstetrics and Gynecology | Admitting: Obstetrics and Gynecology

## 2024-04-03 DIAGNOSIS — Z6841 Body Mass Index (BMI) 40.0 and over, adult: Secondary | ICD-10-CM | POA: Diagnosis not present

## 2024-04-03 DIAGNOSIS — Z01812 Encounter for preprocedural laboratory examination: Secondary | ICD-10-CM | POA: Insufficient documentation

## 2024-04-03 DIAGNOSIS — D069 Carcinoma in situ of cervix, unspecified: Secondary | ICD-10-CM | POA: Diagnosis present

## 2024-04-03 DIAGNOSIS — E66813 Obesity, class 3: Secondary | ICD-10-CM | POA: Diagnosis not present

## 2024-04-03 DIAGNOSIS — Z01818 Encounter for other preprocedural examination: Secondary | ICD-10-CM

## 2024-04-03 DIAGNOSIS — F419 Anxiety disorder, unspecified: Secondary | ICD-10-CM | POA: Diagnosis not present

## 2024-04-03 DIAGNOSIS — D252 Subserosal leiomyoma of uterus: Secondary | ICD-10-CM | POA: Diagnosis not present

## 2024-04-03 DIAGNOSIS — K219 Gastro-esophageal reflux disease without esophagitis: Secondary | ICD-10-CM | POA: Diagnosis not present

## 2024-04-03 LAB — BASIC METABOLIC PANEL WITH GFR
Anion gap: 11 (ref 5–15)
BUN: 12 mg/dL (ref 6–20)
CO2: 24 mmol/L (ref 22–32)
Calcium: 9.6 mg/dL (ref 8.9–10.3)
Chloride: 105 mmol/L (ref 98–111)
Creatinine, Ser: 0.6 mg/dL (ref 0.44–1.00)
GFR, Estimated: >60 mL/min (ref >60)
Glucose, Bld: 115 mg/dL — ABNORMAL HIGH (ref 70–99)
Potassium: 3.8 mmol/L (ref 3.5–5.1)
Sodium: 140 mmol/L (ref 135–145)

## 2024-04-03 LAB — CBC
HCT: 36.9 % (ref 36.0–46.0)
Hemoglobin: 12.5 g/dL (ref 12.0–15.0)
MCH: 30.6 pg (ref 26.0–34.0)
MCHC: 33.9 g/dL (ref 30.0–36.0)
MCV: 90.2 fL (ref 80.0–100.0)
Platelets: 247 K/uL (ref 150–400)
RBC: 4.09 MIL/uL (ref 3.87–5.11)
RDW: 12.1 % (ref 11.5–15.5)
WBC: 7.3 K/uL (ref 4.0–10.5)
nRBC: 0 % (ref 0.0–0.2)

## 2024-04-03 LAB — TYPE AND SCREEN
ABO/RH(D): A POS
Antibody Screen: NEGATIVE

## 2024-04-04 ENCOUNTER — Encounter: Admission: RE | Disposition: A | Payer: Self-pay | Attending: Obstetrics and Gynecology

## 2024-04-04 ENCOUNTER — Encounter: Payer: Self-pay | Admitting: Obstetrics and Gynecology

## 2024-04-04 ENCOUNTER — Ambulatory Visit: Admitting: Certified Registered"

## 2024-04-04 ENCOUNTER — Other Ambulatory Visit: Payer: Self-pay

## 2024-04-04 ENCOUNTER — Ambulatory Visit
Admission: RE | Admit: 2024-04-04 | Discharge: 2024-04-04 | Disposition: A | Attending: Obstetrics and Gynecology | Admitting: Obstetrics and Gynecology

## 2024-04-04 DIAGNOSIS — Z01818 Encounter for other preprocedural examination: Secondary | ICD-10-CM

## 2024-04-04 DIAGNOSIS — D252 Subserosal leiomyoma of uterus: Secondary | ICD-10-CM | POA: Diagnosis not present

## 2024-04-04 DIAGNOSIS — K219 Gastro-esophageal reflux disease without esophagitis: Secondary | ICD-10-CM | POA: Insufficient documentation

## 2024-04-04 DIAGNOSIS — D067 Carcinoma in situ of other parts of cervix: Secondary | ICD-10-CM | POA: Diagnosis not present

## 2024-04-04 DIAGNOSIS — F419 Anxiety disorder, unspecified: Secondary | ICD-10-CM | POA: Insufficient documentation

## 2024-04-04 DIAGNOSIS — N838 Other noninflammatory disorders of ovary, fallopian tube and broad ligament: Secondary | ICD-10-CM | POA: Diagnosis not present

## 2024-04-04 DIAGNOSIS — E66813 Obesity, class 3: Secondary | ICD-10-CM | POA: Insufficient documentation

## 2024-04-04 DIAGNOSIS — D069 Carcinoma in situ of cervix, unspecified: Secondary | ICD-10-CM | POA: Insufficient documentation

## 2024-04-04 DIAGNOSIS — Z6841 Body Mass Index (BMI) 40.0 and over, adult: Secondary | ICD-10-CM | POA: Insufficient documentation

## 2024-04-04 HISTORY — PX: HYSTERECTOMY, VAGINAL, WITH SALPINGECTOMY: SHX7588

## 2024-04-04 LAB — POCT PREGNANCY, URINE: Preg Test, Ur: NEGATIVE

## 2024-04-04 SURGERY — HYSTERECTOMY, VAGINAL, WITH SALPINGECTOMY
Anesthesia: General | Site: Uterus | Laterality: Bilateral

## 2024-04-04 MED ORDER — FENTANYL CITRATE (PF) 100 MCG/2ML IJ SOLN
25.0000 ug | INTRAMUSCULAR | Status: AC | PRN
  Administered 2024-04-04 (×8): 25 ug via INTRAVENOUS

## 2024-04-04 MED ORDER — OXYCODONE HCL 5 MG PO TABS
ORAL_TABLET | ORAL | Status: AC
  Filled 2024-04-04: qty 1

## 2024-04-04 MED ORDER — MIDAZOLAM HCL 2 MG/2ML IJ SOLN
INTRAMUSCULAR | Status: AC
  Filled 2024-04-04: qty 2

## 2024-04-04 MED ORDER — POVIDONE-IODINE 10 % EX SWAB
2.0000 | Freq: Once | CUTANEOUS | Status: AC
  Administered 2024-04-04: 09:00:00 2 via TOPICAL

## 2024-04-04 MED ORDER — LACTATED RINGERS IV SOLN: INTRAVENOUS | Status: DC

## 2024-04-04 MED ORDER — CEFAZOLIN SODIUM-DEXTROSE 2-4 GM/100ML-% IV SOLN
INTRAVENOUS | Status: AC
  Filled 2024-04-04: qty 100

## 2024-04-04 MED ORDER — KETAMINE HCL 50 MG/5ML IJ SOSY
PREFILLED_SYRINGE | INTRAMUSCULAR | Status: DC | PRN
  Administered 2024-04-04: 11:00:00 30 mg via INTRAVENOUS
  Administered 2024-04-04: 11:00:00 10 mg via INTRAVENOUS

## 2024-04-04 MED ORDER — LORAZEPAM 2 MG/ML IJ SOLN
INTRAMUSCULAR | Status: AC
  Filled 2024-04-04: qty 1

## 2024-04-04 MED ORDER — CEFAZOLIN SODIUM-DEXTROSE 2-4 GM/100ML-% IV SOLN
2.0000 g | Freq: Once | INTRAVENOUS | Status: AC
  Administered 2024-04-04: 11:00:00 2 g via INTRAVENOUS

## 2024-04-04 MED ORDER — MIDAZOLAM HCL (PF) 2 MG/2ML IJ SOLN
INTRAMUSCULAR | Status: DC | PRN
  Administered 2024-04-04: 10:00:00 2 mg via INTRAVENOUS

## 2024-04-04 MED ORDER — KETAMINE HCL 50 MG/5ML IJ SOSY
PREFILLED_SYRINGE | INTRAMUSCULAR | Status: AC
  Filled 2024-04-04: qty 5

## 2024-04-04 MED ORDER — PROPOFOL 1000 MG/100ML IV EMUL
INTRAVENOUS | Status: AC
  Filled 2024-04-04: qty 100

## 2024-04-04 MED ORDER — OXYCODONE HCL 5 MG PO TABS
5.0000 mg | ORAL_TABLET | Freq: Once | ORAL | Status: AC
  Administered 2024-04-04: 14:00:00 5 mg via ORAL

## 2024-04-04 MED ORDER — OXYCODONE HCL 5 MG PO TABS
5.0000 mg | ORAL_TABLET | Freq: Once | ORAL | Status: AC | PRN
  Administered 2024-04-04: 12:00:00 5 mg via ORAL

## 2024-04-04 MED ORDER — FENTANYL CITRATE (PF) 100 MCG/2ML IJ SOLN
INTRAMUSCULAR | Status: AC
  Filled 2024-04-04: qty 2

## 2024-04-04 MED ORDER — ACETAMINOPHEN 500 MG PO TABS
ORAL_TABLET | ORAL | Status: AC
  Filled 2024-04-04: qty 2

## 2024-04-04 MED ORDER — LORAZEPAM 2 MG/ML IJ SOLN
1.0000 mg | Freq: Once | INTRAMUSCULAR | Status: AC
  Administered 2024-04-04: 15:00:00 1 mg via INTRAVENOUS

## 2024-04-04 MED ORDER — METRONIDAZOLE 500 MG/100ML IV SOLN
500.0000 mg | Freq: Once | INTRAVENOUS | Status: AC
  Administered 2024-04-04: 11:00:00 500 mg via INTRAVENOUS
  Filled 2024-04-04: qty 100

## 2024-04-04 MED ORDER — OXYCODONE HCL 5 MG/5ML PO SOLN: 5.0000 mg | Freq: Once | ORAL | Status: AC | PRN

## 2024-04-04 MED ORDER — PROPOFOL 1000 MG/100ML IV EMUL
INTRAVENOUS | Status: AC
  Filled 2024-04-04: qty 200

## 2024-04-04 MED ORDER — LIDOCAINE HCL (PF) 2 % IJ SOLN
INTRAMUSCULAR | Status: AC
  Filled 2024-04-04: qty 15

## 2024-04-04 MED ORDER — LIDOCAINE-EPINEPHRINE 1 %-1:100000 IJ SOLN
INTRAMUSCULAR | Status: AC
  Filled 2024-04-04: qty 1

## 2024-04-04 MED ORDER — ONDANSETRON HCL 4 MG/2ML IJ SOLN
INTRAMUSCULAR | Status: DC | PRN
  Administered 2024-04-04: 12:00:00 4 mg via INTRAVENOUS

## 2024-04-04 MED ORDER — LIDOCAINE HCL (CARDIAC) PF 100 MG/5ML IV SOSY
PREFILLED_SYRINGE | INTRAVENOUS | Status: DC | PRN
  Administered 2024-04-04: 10:00:00 80 mg via INTRAVENOUS

## 2024-04-04 MED ORDER — FENTANYL CITRATE (PF) 100 MCG/2ML IJ SOLN
INTRAMUSCULAR | Status: DC | PRN
  Administered 2024-04-04 (×2): 50 ug via INTRAVENOUS

## 2024-04-04 MED ORDER — ROCURONIUM BROMIDE 100 MG/10ML IV SOLN
INTRAVENOUS | Status: DC | PRN
  Administered 2024-04-04: 12:00:00 10 mg via INTRAVENOUS
  Administered 2024-04-04: 10:00:00 60 mg via INTRAVENOUS
  Administered 2024-04-04: 11:00:00 20 mg via INTRAVENOUS

## 2024-04-04 MED ORDER — CHLORHEXIDINE GLUCONATE 0.12 % MT SOLN
15.0000 mL | Freq: Once | OROMUCOSAL | Status: AC
  Administered 2024-04-04: 09:00:00 15 mL via OROMUCOSAL

## 2024-04-04 MED ORDER — KETOROLAC TROMETHAMINE 30 MG/ML IJ SOLN
INTRAMUSCULAR | Status: DC | PRN
  Administered 2024-04-04: 12:00:00 30 mg via INTRAVENOUS

## 2024-04-04 MED ORDER — DEXAMETHASONE SOD PHOSPHATE PF 10 MG/ML IJ SOLN
INTRAMUSCULAR | Status: DC | PRN
  Administered 2024-04-04: 11:00:00 10 mg via INTRAVENOUS

## 2024-04-04 MED ORDER — PROPOFOL 10 MG/ML IV BOLUS
INTRAVENOUS | Status: AC
  Filled 2024-04-04: qty 20

## 2024-04-04 MED ORDER — CHLORHEXIDINE GLUCONATE 0.12 % MT SOLN
OROMUCOSAL | Status: AC
  Filled 2024-04-04: qty 15

## 2024-04-04 MED ORDER — SUGAMMADEX SODIUM 200 MG/2ML IV SOLN
INTRAVENOUS | Status: DC | PRN
  Administered 2024-04-04: 12:00:00 400 mg via INTRAVENOUS

## 2024-04-04 MED ORDER — LIDOCAINE-EPINEPHRINE 1 %-1:100000 IJ SOLN
INTRAMUSCULAR | Status: DC | PRN
  Administered 2024-04-04: 11:00:00 10 mL

## 2024-04-04 MED ORDER — ONDANSETRON HCL 4 MG/2ML IJ SOLN
4.0000 mg | Freq: Once | INTRAMUSCULAR | Status: AC | PRN
  Administered 2024-04-04: 12:00:00 4 mg via INTRAVENOUS

## 2024-04-04 MED ORDER — ACETAMINOPHEN 500 MG PO TABS
1000.0000 mg | ORAL_TABLET | ORAL | Status: AC
  Administered 2024-04-04: 09:00:00 1000 mg via ORAL

## 2024-04-04 MED ORDER — DEXMEDETOMIDINE HCL IN NACL 80 MCG/20ML IV SOLN
INTRAVENOUS | Status: DC | PRN
  Administered 2024-04-04: 11:00:00 8 ug via INTRAVENOUS
  Administered 2024-04-04: 12:00:00 4 ug via INTRAVENOUS
  Administered 2024-04-04: 11:00:00 8 ug via INTRAVENOUS

## 2024-04-04 MED ORDER — ORAL CARE MOUTH RINSE: 15.0000 mL | Freq: Once | OROMUCOSAL | Status: AC

## 2024-04-04 MED ORDER — PROPOFOL 10 MG/ML IV BOLUS
INTRAVENOUS | Status: DC | PRN
  Administered 2024-04-04: 10:00:00 150 ug/kg/min via INTRAVENOUS
  Administered 2024-04-04: 10:00:00 200 mg via INTRAVENOUS

## 2024-04-04 MED ORDER — ONDANSETRON HCL 4 MG/2ML IJ SOLN
INTRAMUSCULAR | Status: AC
  Filled 2024-04-04: qty 2

## 2024-04-04 SURGICAL SUPPLY — 24 items
DRAPE PERI LITHO V/GYN (MISCELLANEOUS) ×1 IMPLANT
DRAPE UNDER BUTTOCK W/FLU (DRAPES) ×1 IMPLANT
ELECTRODE REM PT RTRN 9FT ADLT (ELECTROSURGICAL) ×1 IMPLANT
GAUZE 4X4 16PLY ~~LOC~~+RFID DBL (SPONGE) ×2 IMPLANT
GLOVE SURG SYN 8.0 PF PI (GLOVE) ×1 IMPLANT
GOWN STRL REUS W/ TWL LRG LVL3 (GOWN DISPOSABLE) ×3 IMPLANT
GOWN STRL REUS W/ TWL XL LVL3 (GOWN DISPOSABLE) ×1 IMPLANT
KIT PINK PAD W/HEAD ARM REST (MISCELLANEOUS) ×1 IMPLANT
LABEL OR SOLS (LABEL) ×1 IMPLANT
MANIFOLD NEPTUNE II (INSTRUMENTS) ×1 IMPLANT
NDL HYPO 22X1.5 SAFETY MO (MISCELLANEOUS) ×1 IMPLANT
NEEDLE HYPO 22X1.5 SAFETY MO (MISCELLANEOUS) ×1 IMPLANT
PACK BASIN MINOR ARMC (MISCELLANEOUS) ×1 IMPLANT
PAD PREP OB/GYN DISP 24X41 (PERSONAL CARE ITEMS) ×1 IMPLANT
SOLN 0.9% NACL POUR BTL 1000ML (IV SOLUTION) ×1 IMPLANT
SOLUTION PREP PVP 2OZ (MISCELLANEOUS) ×2 IMPLANT
SURGILUBE 2OZ TUBE FLIPTOP (MISCELLANEOUS) ×1 IMPLANT
SUT PDS 2-0 27IN (SUTURE) IMPLANT
SUT VIC AB 0 CT1 27XCR 8 STRN (SUTURE) ×2 IMPLANT
SUT VIC AB 0 CT1 36 (SUTURE) ×1 IMPLANT
SUT VIC AB 2-0 SH 27XBRD (SUTURE) ×1 IMPLANT
SYR 10ML LL (SYRINGE) ×1 IMPLANT
SYR CONTROL 10ML LL (SYRINGE) ×1 IMPLANT
TRAP FLUID SMOKE EVACUATOR (MISCELLANEOUS) ×1 IMPLANT

## 2024-04-04 NOTE — Progress Notes (Signed)
 Pt here for [facility] and BS for adenocarcinoma insitu s/p 2 CKC. NPO . Labs reviewed . All questions answered . Proceed

## 2024-04-04 NOTE — Transfer of Care (Signed)
 Immediate Anesthesia Transfer of Care Note  Patient: Loretta Burton  Procedure(s) Performed: HYSTERECTOMY, VAGINAL, WITH SALPINGECTOMY (Bilateral: Uterus)  Patient Location: PACU  Anesthesia Type:General  Level of Consciousness: drowsy and patient cooperative  Airway & Oxygen Therapy: Patient Spontanous Breathing and Patient connected to face mask oxygen  Post-op Assessment: Report given to RN, Post -op Vital signs reviewed and stable, and Patient moving all extremities X 4  Post vital signs: Reviewed and stable  Last Vitals:  Vitals Value Taken Time  BP 131/102 04/04/24 12:01  Temp    Pulse 51 04/04/24 12:07  Resp 19 04/04/24 12:07  SpO2 97 % 04/04/24 12:07  Vitals shown include unfiled device data.  Last Pain:  Vitals:   04/04/24 0836  PainSc: 0-No pain         Complications: No notable events documented.

## 2024-04-04 NOTE — Op Note (Unsigned)
 NAMEFINLEY, Burton MEDICAL RECORD NO: [id number] ACCOUNT NO: [id number] DATE OF BIRTH: 08/05/1985 FACILITY: [facility] LOCATION: [facility] PHYSICIAN: Loretta DOROTHA Dinsmore, MD  Operative Report   DATE OF PROCEDURE: 04/04/2024  PREOPERATIVE DIAGNOSES:  Cervical adenocarcinoma in situ.  Status post cold knife conization x2.  POSTOPERATIVE DIAGNOSES:  Cervical adenocarcinoma in situ.  Status post cold knife conization x2.  PROCEDURE:   1. Total vaginal hysterectomy.   2. Bilateral salpingectomy.  SURGEON:  Loretta DOROTHA Dinsmore, MD.  FIRST ASSISTANT:  Garnette Mace, MD.  ANESTHESIA:  General endotracheal anesthesia.  INDICATIONS:  A 38 year old gravida 2, para 2 patient with known adenocarcinoma in situ of the cervix.  She is status post 2 separate cold knife conizations, 1 in 11/2023 with a repeat on 02/04/2024.  Pathology from the second conization shows positive  endocervical margin and no evidence of cancer.  DESCRIPTION OF PROCEDURE:  After adequate general endotracheal anesthesia, the patient was placed in the dorsal supine position with the legs in the candy cane stirrups.  The patient's abdomen, perineum, and vagina were prepped and draped in a normal  sterile fashion.  A timeout was performed.  The patient did receive 2 g IV Ancef  and 500 mg Flagyl  for surgical prophylaxis.  Straight catheterization of the bladder yielded 50 mL of clear urine.  A weighted speculum was placed in the posterior vaginal  vault, and the cervix was grasped with 2 thyroid  tenacula.  The cervix was circumferentially injected with 1% lidocaine  with 1:100,000 epinephrine .  A direct posterior colpotomy incision was made.  Upon entering the posterior cul-de-sac, the peritoneal  edge was tagged to the vaginal epithelium x2.  A long-billed weighted speculum was placed into the posterior cul-de-sac.  The uterosacral ligaments were then bilaterally clamped, transected, and suture ligated with 0 Vicryl  suture.  The anterior cervix  was circumferentially incised with the Bovie, and the bladder was reflected from the lower cervix.  The cardinal ligaments were then bilaterally clamped, transected, and suture ligated with 0 Vicryl suture.  The anterior cul-de-sac was entered sharply.   The uterine arteries were then bilaterally clamped, transected, and suture ligated with 0 Vicryl suture.  Sequential clamping, cutting, and suturing ensued to the cornua, each cornua was clamped, and the cervix and uterus intact was removed.  The each  pedicle was doubly ligated with 0 Vicryl suture.  Each fallopian tube was identified, the distal portion clamped and removed, and suture ligated with 0 Vicryl suture.  Ovaries appeared normal.  There was a right ovarian cyst that was drained, and benign  fluid resulted.  The peritoneum was then closed after good hemostasis was noted with 2-0 PDS in a pursestring fashion, and the vaginal cuff was closed vertically with 0 Vicryl suture in a running non-locking fashion.  The uterosacral ligaments were  plicated centrally, and the rest of the cuff was closed with 0 Vicryl suture.  Good hemostasis was noted.  The bladder was recatheterized, yielding an additional 25 mL of clear urine.  The patient did receive 30 mg intravenous Toradol  at the end of the  procedure.  There were no complications.  ESTIMATED BLOOD LOSS:  100 mL.  INTRAOPERATIVE FLUIDS:  700 mL.  URINE OUTPUT:  75 mL.  DISPOSITION:  The patient tolerated the procedure well and was taken to the recovery room in good condition.   CHR D: 04/04/2024 12:24:20 pm T: 04/04/2024 12:34:00 pm  JOB: [id number]/ [id number]

## 2024-04-04 NOTE — OR Nursing (Signed)
 In and out urinary catheterization performed by Dr Lovetta

## 2024-04-04 NOTE — Brief Op Note (Signed)
 04/04/2024  11:55 AM  PATIENT:  Loretta Burton  38 y.o. female  PRE-OPERATIVE DIAGNOSIS:  adenocarcinoma in situ. cervix  POST-OPERATIVE DIAGNOSIS:  adenocarcinoma in situ, cervix  PROCEDURE:  Procedures: HYSTERECTOMY, VAGINAL, WITH SALPINGECTOMY (Bilateral)  SURGEON:  Surgeons and Role:    * Celester Lech, Debby PARAS, MD - Primary    * [name] Garnette BIRCH, MD - Assisting  PHYSICIAN ASSISTANT: cst  ASSISTANTS: none   ANESTHESIA:   general  EBL:  100 mL  iof 700 cc uo 75 cc  BLOOD ADMINISTERED:none  DRAINS: none   LOCAL MEDICATIONS USED:  LIDOCAINE    SPECIMEN:  Source of Specimen:  cervix , uterus and bilateral fallopian tubes  DISPOSITION OF SPECIMEN:  PATHOLOGY  COUNTS:  YES  TOURNIQUET:  * No tourniquets in log *  DICTATION: .Other Dictation: Dictation Number verbal  PLAN OF CARE: Discharge to home after PACU  PATIENT DISPOSITION:  PACU - hemodynamically stable.   Delay start of Pharmacological VTE agent (>24hrs) due to surgical blood loss or risk of bleeding: not applicable

## 2024-04-04 NOTE — Anesthesia Preprocedure Evaluation (Addendum)
 Anesthesia Evaluation  Patient identified by MRN, date of birth, ID band Patient awake    Reviewed: Allergy & Precautions, NPO status , Patient's Chart, lab work & pertinent test results  Airway Mallampati: II  TM Distance: >3 FB Neck ROM: full    Dental  (+) Teeth Intact   Pulmonary neg pulmonary ROS   Pulmonary exam normal breath sounds clear to auscultation       Cardiovascular Exercise Tolerance: Good negative cardio ROS Normal cardiovascular exam Rhythm:Regular Rate:Normal     Neuro/Psych   Anxiety     negative neurological ROS  negative psych ROS   GI/Hepatic negative GI ROS, Neg liver ROS,GERD  Medicated,,  Endo/Other  negative endocrine ROS  Class 3 obesity  Renal/GU negative Renal ROS  negative genitourinary   Musculoskeletal   Abdominal  (+) + obese  Peds negative pediatric ROS (+)  Hematology negative hematology ROS (+) Blood dyscrasia, anemia   Anesthesia Other Findings Past Medical History: 09/01/2022: Abscess of nose No date: Anemia No date: Cervical adenocarcinoma (HCC) No date: Cholelithiasis 08/12/2017: Decreased fetus movements affecting management of mother  in third trimester No date: Depression 12/24/2016: Depression affecting pregnancy No date: GERD (gastroesophageal reflux disease) No date: Hyperlipidemia No date: Insomnia 12/24/2016: Obesity affecting pregnancy No date: Panic attack No date: Periorbital cellulitis of left eye No date: Seasonal allergies 12/24/2016: Supervision of high-risk pregnancy No date: Vitamin B12 deficiency  Past Surgical History: 12/03/2023: CERVICAL CONIZATION W/BX; N/A     Comment:  Procedure: CONE BIOPSY, CERVIX;  Surgeon: Lovetta Beverli GAILS, MD;  Location: [facility];  Service: Gynecology;              Laterality: N/A;  COLD KNIFE CONIZATION , ENDOCERVICAL               CURETTAGE 02/04/2024: CERVICAL CONIZATION W/BX; N/A      Comment:  Procedure: CONE BIOPSY, CERVIX;  Surgeon: Lovetta Beverli GAILS, MD;  Location: [facility];  Service: Gynecology;              Laterality: N/A; 11/28/2014: CHOLECYSTECTOMY; N/A     Comment:  Procedure: LAPAROSCOPIC CHOLECYSTECTOMY WITH               INTRAOPERATIVE CHOLANGIOGRAM;  Surgeon: Charlie FORBES Fell,              MD;  Location: [facility];  Service: General;  Laterality:               N/A; 02/04/2024: DILATION AND CURETTAGE OF UTERUS     Comment:  Procedure: ENDOCERVICAL CURETTAGE;  Surgeon:               Lovetta Beverli GAILS, MD;  Location: [facility];                Service: Gynecology;; No date: WISDOM TOOTH EXTRACTION     Comment:  x4     Reproductive/Obstetrics negative OB ROS                              Anesthesia Physical Anesthesia Plan  ASA: 2  Anesthesia Plan: General   Post-op Pain Management:    Induction: Intravenous  PONV Risk Score and Plan: Ondansetron , Dexamethasone , Midazolam  and Treatment may vary due to age or medical condition  Airway Management Planned: Oral ETT  Additional Equipment:   Intra-op Plan:   Post-operative Plan: Extubation in OR  Informed Consent: I have reviewed the patients History and Physical, chart, labs and discussed the procedure including the risks, benefits and alternatives for the proposed anesthesia with the patient or authorized representative who has indicated his/her understanding and acceptance.     Dental Advisory Given  Plan Discussed with: CRNA  Anesthesia Plan Comments:         Anesthesia Quick Evaluation

## 2024-04-04 NOTE — Anesthesia Postprocedure Evaluation (Signed)
 Anesthesia Post Note  Patient: Lucie SAUNDERS Sambrano  Procedure(s) Performed: HYSTERECTOMY, VAGINAL, WITH SALPINGECTOMY (Bilateral: Uterus)  Patient location during evaluation: PACU Anesthesia Type: General Level of consciousness: awake Pain management: satisfactory to patient Vital Signs Assessment: post-procedure vital signs reviewed and stable Respiratory status: spontaneous breathing Cardiovascular status: blood pressure returned to baseline Anesthetic complications: no   No notable events documented.   Last Vitals:  Vitals:   04/04/24 1244 04/04/24 1245  BP: 106/62 106/62  Pulse: (!) 52 (!) 48  Resp: (!) 9 14  Temp:    SpO2: 93% 95%    Last Pain:  Vitals:   04/04/24 1230  PainSc: 6                  VAN STAVEREN,Hser Belanger

## 2024-04-04 NOTE — Anesthesia Procedure Notes (Signed)
 Procedure Name: Intubation Date/Time: 04/04/2024 10:26 AM  Performed by: Lennie Lamarr HERO, CRNAPre-anesthesia Checklist: Patient identified, Emergency Drugs available, Suction available and Patient being monitored Patient Re-evaluated:Patient Re-evaluated prior to induction Oxygen Delivery Method: Circle System Utilized Preoxygenation: Pre-oxygenation with 100% oxygen Induction Type: IV induction Ventilation: Mask ventilation without difficulty Laryngoscope Size: McGrath and 3 Grade View: Grade II Tube type: Oral Tube size: 7.0 mm Number of attempts: 1 Airway Equipment and Method: Stylet and Oral airway Placement Confirmation: ETT inserted through vocal cords under direct vision, positive ETCO2 and breath sounds checked- equal and bilateral Secured at: 22 cm Tube secured with: Tape Dental Injury: Teeth and Oropharynx as per pre-operative assessment

## 2024-04-05 ENCOUNTER — Encounter: Payer: Self-pay | Admitting: Obstetrics and Gynecology

## 2024-04-05 DIAGNOSIS — F419 Anxiety disorder, unspecified: Secondary | ICD-10-CM

## 2024-04-05 MED ORDER — HYDROXYZINE HCL 10 MG PO TABS: 10.0000 mg | ORAL_TABLET | Freq: Every evening | ORAL | 0 refills | Status: AC | PRN

## 2024-04-06 LAB — SURGICAL PATHOLOGY
